# Patient Record
Sex: Female | Born: 1935 | Race: White | Hispanic: No | Smoking: Never smoker
Health system: Southern US, Community
[De-identification: ages and names within clinical notes are randomized; demographics above are authoritative.]

## PROBLEM LIST (undated history)

## (undated) DIAGNOSIS — F329 Major depressive disorder, single episode, unspecified: Secondary | ICD-10-CM

## (undated) DIAGNOSIS — F419 Anxiety disorder, unspecified: Secondary | ICD-10-CM

## (undated) DIAGNOSIS — I10 Essential (primary) hypertension: Secondary | ICD-10-CM

## (undated) DIAGNOSIS — Z87828 Personal history of other (healed) physical injury and trauma: Secondary | ICD-10-CM

## (undated) DIAGNOSIS — F32A Depression, unspecified: Secondary | ICD-10-CM

## (undated) HISTORY — DX: Depression, unspecified: F32.A

## (undated) HISTORY — DX: Essential (primary) hypertension: I10

## (undated) HISTORY — PX: NECK SURGERY: SHX720

## (undated) HISTORY — DX: Anxiety disorder, unspecified: F41.9

## (undated) HISTORY — PX: CHOLECYSTECTOMY: SHX55

## (undated) HISTORY — PX: ABDOMINAL HYSTERECTOMY: SHX81

---
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
## 2007-02-20 ENCOUNTER — Ambulatory Visit: Payer: Self-pay | Admitting: Family Medicine

## 2007-02-20 DIAGNOSIS — G589 Mononeuropathy, unspecified: Secondary | ICD-10-CM | POA: Insufficient documentation

## 2007-02-20 DIAGNOSIS — M545 Low back pain, unspecified: Secondary | ICD-10-CM | POA: Insufficient documentation

## 2007-02-20 DIAGNOSIS — R5383 Other fatigue: Secondary | ICD-10-CM

## 2007-02-20 DIAGNOSIS — R5381 Other malaise: Secondary | ICD-10-CM | POA: Insufficient documentation

## 2007-02-20 DIAGNOSIS — I10 Essential (primary) hypertension: Secondary | ICD-10-CM

## 2007-02-20 DIAGNOSIS — F341 Dysthymic disorder: Secondary | ICD-10-CM | POA: Insufficient documentation

## 2007-02-21 ENCOUNTER — Encounter (INDEPENDENT_AMBULATORY_CARE_PROVIDER_SITE_OTHER): Payer: Self-pay | Admitting: Family Medicine

## 2007-02-22 LAB — CONVERTED CEMR LAB
AST: 13 units/L (ref 0–37)
Albumin: 4.2 g/dL (ref 3.5–5.2)
Alkaline Phosphatase: 64 units/L (ref 39–117)
BUN: 14 mg/dL (ref 6–23)
Calcium: 9.3 mg/dL (ref 8.4–10.5)
Chloride: 105 meq/L (ref 96–112)
Eosinophils Absolute: 0 10*3/uL (ref 0.0–0.7)
Glucose, Bld: 80 mg/dL (ref 70–99)
Lymphs Abs: 1.9 10*3/uL (ref 0.7–4.0)
MCV: 98.4 fL (ref 78.0–100.0)
Monocytes Relative: 11 % (ref 3–12)
Neutro Abs: 4.5 10*3/uL (ref 1.7–7.7)
Neutrophils Relative %: 62 % (ref 43–77)
Platelets: 353 10*3/uL (ref 150–400)
Potassium: 4.1 meq/L (ref 3.5–5.3)
RBC: 4.46 M/uL (ref 3.87–5.11)
Sodium: 140 meq/L (ref 135–145)
Total Protein: 6.9 g/dL (ref 6.0–8.3)
WBC: 7.2 10*3/uL (ref 4.0–10.5)

## 2007-02-23 ENCOUNTER — Telehealth (INDEPENDENT_AMBULATORY_CARE_PROVIDER_SITE_OTHER): Payer: Self-pay | Admitting: *Deleted

## 2007-03-09 ENCOUNTER — Ambulatory Visit: Payer: Self-pay | Admitting: Family Medicine

## 2007-03-09 DIAGNOSIS — R519 Headache, unspecified: Secondary | ICD-10-CM | POA: Insufficient documentation

## 2007-03-09 DIAGNOSIS — R51 Headache: Secondary | ICD-10-CM | POA: Insufficient documentation

## 2007-04-12 ENCOUNTER — Telehealth (INDEPENDENT_AMBULATORY_CARE_PROVIDER_SITE_OTHER): Payer: Self-pay | Admitting: *Deleted

## 2014-02-01 DIAGNOSIS — Z87828 Personal history of other (healed) physical injury and trauma: Secondary | ICD-10-CM

## 2014-02-01 HISTORY — DX: Personal history of other (healed) physical injury and trauma: Z87.828

## 2015-05-28 LAB — PULMONARY FUNCTION TEST

## 2018-08-02 ENCOUNTER — Encounter: Payer: Self-pay | Admitting: Family Medicine

## 2018-08-02 ENCOUNTER — Ambulatory Visit (INDEPENDENT_AMBULATORY_CARE_PROVIDER_SITE_OTHER): Payer: Medicare Other | Admitting: Family Medicine

## 2018-08-02 ENCOUNTER — Encounter (INDEPENDENT_AMBULATORY_CARE_PROVIDER_SITE_OTHER): Payer: Self-pay

## 2018-08-02 ENCOUNTER — Other Ambulatory Visit: Payer: Self-pay

## 2018-08-02 VITALS — BP 142/68 | HR 78 | Temp 98.2°F | Resp 12 | Ht 60.0 in | Wt 167.1 lb

## 2018-08-02 DIAGNOSIS — R251 Tremor, unspecified: Secondary | ICD-10-CM | POA: Diagnosis not present

## 2018-08-02 DIAGNOSIS — G4700 Insomnia, unspecified: Secondary | ICD-10-CM

## 2018-08-02 DIAGNOSIS — M5440 Lumbago with sciatica, unspecified side: Secondary | ICD-10-CM

## 2018-08-02 DIAGNOSIS — I10 Essential (primary) hypertension: Secondary | ICD-10-CM | POA: Diagnosis not present

## 2018-08-02 DIAGNOSIS — Z6832 Body mass index (BMI) 32.0-32.9, adult: Secondary | ICD-10-CM

## 2018-08-02 DIAGNOSIS — E6609 Other obesity due to excess calories: Secondary | ICD-10-CM | POA: Diagnosis not present

## 2018-08-02 DIAGNOSIS — G8929 Other chronic pain: Secondary | ICD-10-CM

## 2018-08-02 DIAGNOSIS — L602 Onychogryphosis: Secondary | ICD-10-CM

## 2018-08-02 DIAGNOSIS — F419 Anxiety disorder, unspecified: Secondary | ICD-10-CM

## 2018-08-02 NOTE — Patient Instructions (Signed)
    Thank you for coming into the office today. I appreciate the opportunity to provide you with the care for your health and wellness. Today we discussed: overall health  FOLLOW UP: 3 months  Labs today  Work on walking daily and eating a well balanced diet.  We will get records and review.   Please continue to practice social distancing to keep you, your family, and our community safe.  If you must go out, please wear a Mask and practice good handwashing.  [place] YOUR HANDS WELL AND FREQUENTLY. AVOID TOUCHING YOUR FACE, UNLESS YOUR HANDS ARE FRESHLY WASHED.  GET FRESH AIR DAILY. STAY HYDRATED WITH WATER.   It was a pleasure to see you and I look forward to continuing to work together on your health and well-being. Please do not hesitate to call the office if you need care or have questions about your care.  Have a wonderful day and week.  With Gratitude,  Cherly Beach, DNP, AGNP-BC

## 2018-08-02 NOTE — Progress Notes (Signed)
Subjective:     Patient ID: Nancy Schroeder, female   DOB: Jan 14, 1934, 85 y.o.   MRN: [id number]  Nancy Schroeder presents for New Patient (Initial Visit) (establish care)  Nancy Schroeder moved from [place] state with her son back in February.   Already had 1 son that lives down here in the [place] area.   Has a history of hypertension, back injury, depression, anxiety, neck surgery, abdominal hysterectomy and cholecystectomy, family history is pertinent for diabetes.  Reports that she is never smoked.  Reports that she occasionally drinks alcohol.  Never had drug use.  Lives with her son Nancy Schroeder who she moved down here with.  Overall has 3 sons.  As previously stated the oldest one lives in [place].  The youngest one lives in [place] still [place].  She has cats at home.  She enjoys sewing and knitting.  Really likes astrology as well.  Diet wise to try to eat what she wants she does enjoy fruits and veggies.  Not very much in heavy meat eating.  She enjoys 1 to 2 cups of coffee daily.  And loves to drink iced tea.  Will drink 6 to 8 cups of water regularly.    Reports that she is taking BuSpar as needed. Denies having any excessive anxiety.  Reports that she is taking her hydrochlorothiazide and lisinopril as directed.  Does not have any signs and symptoms of elevated BP at home.  Per her.  Denies having any headaches, vision changes, dizziness, palpitations.    Concerns today: Back injury: Hurt at [place] working 2016. Lower back and neck injured, needs sx, but spinal cord is at risk. Wants to wait on secondary opinion  Legs: Was told she might have a day she can never walk.   Shaking: occurs randomly, family hx of DM, never told you DM, unsure of labs. When shaking she eats something and it makes it better. Denies excess thrist, hunger, and urination . Headaches.   Will need to get her previous lab work along with her history from the offices that she went to in [place].   She is unsure on most of her background and more specific details of her history. Overall though outside of the concerns that she brought up that were stated above.  She is doing well.  Denies having any signs of symptoms of fever or chills, cough or shortness of breath.  She would like to have her toenails cut so referral to podiatry is requested.  Today patient denies signs and symptoms of COVID 19 infection including fever, chills, cough, shortness of breath, and headache.  Past Medical, Surgical, Social History, Allergies, and Medications have been Reviewed.    Past Medical History:  Diagnosis Date   Anxiety    Depression    H/O back injury 2016   Hypertension    Past Surgical History:  Procedure Laterality Date   ABDOMINAL HYSTERECTOMY     CHOLECYSTECTOMY     NECK SURGERY     Social History   Socioeconomic History   Marital status: Widowed    Spouse name: Not on file   Number of children: 3   Years of education: Not on file   Highest education level: 8th grade  Occupational History   Not on file  Social Needs   Financial resource strain: Not hard at all   Food insecurity    Worry: Never true    Inability: Never true   Transportation needs  Medical: No    Non-medical: No  Tobacco Use   Smoking status: Never Smoker   Smokeless tobacco: Never Used  Substance and Sexual Activity   Alcohol use: Yes   Drug use: Never   Sexual activity: Not Currently  Lifestyle   Physical activity    Days per week: 0 days    Minutes per session: 0 min   Stress: Only a little  Relationships   Social connections    Talks on phone: More than three times a week    Gets together: More than three times a week    Attends religious service: Never    Active member of club or organization: No    Attends meetings of clubs or organizations: Never    Relationship status: Widowed   Intimate partner violence    Fear of current or ex partner: No    Emotionally  abused: No    Physically abused: No    Forced sexual activity: No  Other Topics Concern   Not on file  Social History Narrative   Live with Insurance underwriter (middle)   Has 3 sons   Oldest lives in [place] lives in [place] (with 1 grandchild)   3 cats: lily-16 , troubles, whiskers (boys-5 years)      Enjoyed sewing, and knitting, but back injury made this worse. Read, tv, putt around, likes astrology      Diet: Eats what she wants. Enjoys veggies and fruits   Caffeine: 1-2 cups of coffee, iced tea   Water: 6-8 cups daily       Wear seatbelt   Does not wear sunscreen   Smoke and carbon monoxide detectors          Outpatient Encounter Medications as of 08/02/2018  Medication Sig   busPIRone (BUSPAR) 10 MG tablet Take 10 mg by mouth daily as needed.   hydrochlorothiazide (HYDRODIURIL) 25 MG tablet Take 25 mg by mouth daily.   lisinopril (ZESTRIL) 40 MG tablet Take 40 mg by mouth daily.   No facility-administered encounter medications on file as of 08/02/2018.    Allergies  Allergen Reactions   Influenza Vaccines Swelling    Review of Systems  Constitutional: Negative for activity change, appetite change, chills and fever.  HENT: Negative.   Eyes: Negative for visual disturbance.  Respiratory: Negative for cough and shortness of breath.   Cardiovascular: Negative for chest pain, palpitations and leg swelling.  Gastrointestinal: Negative.   Endocrine: Negative for polydipsia, polyphagia and polyuria.  Genitourinary: Negative.   Musculoskeletal: Positive for arthralgias and back pain.  Skin: Negative.   Neurological: Positive for headaches. Negative for dizziness.  Hematological: Negative.   Psychiatric/Behavioral: Positive for sleep disturbance. The patient is nervous/anxious.        Objective:     BP (!) 142/68    Pulse 78    Temp 98.2 F (36.8 C) (Temporal)    Resp 12    Ht 5' (1.524 m)    Wt 167 lb 1.3 oz (75.8 kg)    SpO2 97%    BMI 32.63 kg/m    Physical Exam Constitutional:      Appearance: Normal appearance. She is obese.  HENT:     Head: Normocephalic and atraumatic.     Right Ear: External ear normal.     Left Ear: External ear normal.     Nose: Nose normal.  Eyes:     General:        Right eye: No discharge.  Left eye: No discharge.     Conjunctiva/sclera: Conjunctivae normal.  Neck:     Musculoskeletal: Normal range of motion and neck supple.  Cardiovascular:     Rate and Rhythm: Normal rate and regular rhythm.     Pulses: Normal pulses.     Heart sounds: Normal heart sounds.  Pulmonary:     Effort: Pulmonary effort is normal.     Breath sounds: Normal breath sounds.  Musculoskeletal: Normal range of motion.  Skin:    General: Skin is warm and dry.  Neurological:     Mental Status: She is alert and oriented to person, place, and time.  Psychiatric:        Mood and Affect: Mood normal.        Behavior: Behavior normal.        Thought Content: Thought content normal.        Judgment: Judgment normal.        Assessment and Plan        1. Chronic low back pain with sciatica, sciatica laterality unspecified, unspecified back pain laterality She reports chronic low back pain with sciatica.  Secondary to an injury as well.  Will need to get her follow-up records to assess the injury as she is not 100% sure on what it was.  In addition to possible need for referral as well.  2. Essential hypertension Blood pressure is a little bit elevated she reports she is not taking any of her medicines yet today.  But reports that she normally takes them on a daily basis.  Advised for her to continue to take the medications prior to coming into the appointment so I can make sure that they are doing the best for her.  Or if she needs to have any adjustments or changes.  We will be assessing her blood levels and kidney function and liver function.  Advised to maintain a DASH diet.  Encouraged her to walk 30 minutes on  most days of the week as she can.  Secondary to her back pain.  - COMPLETE METABOLIC PANEL WITH GFR - CBC with Differential/Platelet  3. Episode of shaking Reports she has episodes of shaking that go away when she eats.  Has a family history of diabetes.  She is unsure if this is the cause.  We will be assessing A1c and vitamin B12 to address if either 1 of these could be the cause.  - Hemoglobin A1c - Vitamin B12  4. Class 1 obesity due to excess calories with body mass index (BMI) of 32.0 to 32.9 in adult, unspecified whether serious comorbidity present Educated her on the need to lose some weight.  We will be assessing her labs as well.  Advised to keep a heart healthy, low carbohydrate low-fat diet.  Additionally encouraged her to exercise 30 minutes on at least 5 or more days of the week.  - COMPLETE METABOLIC PANEL WITH GFR - Hemoglobin A1c - Lipid panel - TSH  5. Anxiety Has anxiety has occasional use of buspirone.  Reports that she is doing well and does not need a refill at this time and feels it is under control.  6. Insomnia, unspecified type okay thanks reports she does not sleep well.  This is something that she is suffered with in the past.  Not currently going to prescribe anything for this at this time.  Advised that if she can get some good some way and walking and that might help her with her  rest.  And making sure that she does not layer take naps during the day.  But to be mindful that it is not uncommon to have fragmented sleep as we get older.  Follow Up: 3 months   Perlie Mayo, DNP, AGNP-BC [address], [address], [place] [address] Office Hours: Mon-Thurs 8 am-5 pm; Fri 8 am-12 pm Office Phone:  [phone]  Office Fax: [phone]

## 2018-08-03 LAB — CBC WITH DIFFERENTIAL/PLATELET
Absolute Monocytes: 636 cells/uL (ref 200–950)
Basophils Absolute: 77 cells/uL (ref 0–200)
Basophils Relative: 0.9 %
Eosinophils Absolute: 103 cells/uL (ref 15–500)
Eosinophils Relative: 1.2 %
HCT: 40.9 % (ref 35.0–45.0)
Hemoglobin: 13.6 g/dL (ref 11.7–15.5)
Lymphs Abs: 2081 cells/uL (ref 850–3900)
MCH: 30.7 pg (ref 27.0–33.0)
MCHC: 33.3 g/dL (ref 32.0–36.0)
MCV: 92.3 fL (ref 80.0–100.0)
MPV: 9.9 fL (ref 7.5–12.5)
Monocytes Relative: 7.4 %
Neutro Abs: 5702 cells/uL (ref 1500–7800)
Neutrophils Relative %: 66.3 %
Platelets: 323 10*3/uL (ref 140–400)
RBC: 4.43 10*6/uL (ref 3.80–5.10)
RDW: 11.8 % (ref 11.0–15.0)
Total Lymphocyte: 24.2 %
WBC: 8.6 10*3/uL (ref 3.8–10.8)

## 2018-08-03 LAB — LIPID PANEL
Cholesterol: 184 mg/dL (ref <200)
HDL: 51 mg/dL (ref >=50)
LDL Cholesterol (Calc): 107 mg/dL (calc) — ABNORMAL HIGH
Non-HDL Cholesterol (Calc): 133 mg/dL (calc) — ABNORMAL HIGH (ref <130)
Total CHOL/HDL Ratio: 3.6 (calc) (ref <5.0)
Triglycerides: 138 mg/dL (ref <150)

## 2018-08-03 LAB — COMPLETE METABOLIC PANEL WITH GFR
AG Ratio: 1.5 (calc) (ref 1.0–2.5)
ALT: 18 U/L (ref 6–29)
AST: 17 U/L (ref 10–35)
Albumin: 4 g/dL (ref 3.6–5.1)
Alkaline phosphatase (APISO): 60 U/L (ref 37–153)
BUN: 12 mg/dL (ref 7–25)
CO2: 27 mmol/L (ref 20–32)
Calcium: 9.6 mg/dL (ref 8.6–10.4)
Chloride: 106 mmol/L (ref 98–110)
Creat: 0.74 mg/dL (ref 0.60–0.88)
GFR, Est African American: 87 mL/min/{1.73_m2} (ref >=60)
GFR, Est Non African American: 75 mL/min/{1.73_m2} (ref >=60)
Globulin: 2.7 g/dL (calc) (ref 1.9–3.7)
Glucose, Bld: 91 mg/dL (ref 65–99)
Potassium: 4.1 mmol/L (ref 3.5–5.3)
Sodium: 142 mmol/L (ref 135–146)
Total Bilirubin: 0.6 mg/dL (ref 0.2–1.2)
Total Protein: 6.7 g/dL (ref 6.1–8.1)

## 2018-08-03 LAB — HEMOGLOBIN A1C
Hgb A1c MFr Bld: 5.5 % of total Hgb (ref <5.7)
Mean Plasma Glucose: 111 (calc)
eAG (mmol/L): 6.2 (calc)

## 2018-08-03 LAB — VITAMIN B12: Vitamin B-12: 339 pg/mL (ref 200–1100)

## 2018-08-03 LAB — TSH: TSH: 2.35 mIU/L (ref 0.40–4.50)

## 2018-08-03 NOTE — Progress Notes (Signed)
Overall labs are great. Kidney, liver, blood levels,  A1c (diabetes), thyroid and Vitamin B12 all in normal range. Only elevation was bad cholesterol. Slightly elevated, can reduce this by avoiding fried, fatty foods, and extra oils, butters, and sauces. Continue a well balanced diet, hydration with water, and exercise.

## 2018-09-01 ENCOUNTER — Other Ambulatory Visit: Payer: Self-pay

## 2018-09-01 ENCOUNTER — Encounter: Payer: Self-pay | Admitting: Podiatry

## 2018-09-01 ENCOUNTER — Ambulatory Visit (INDEPENDENT_AMBULATORY_CARE_PROVIDER_SITE_OTHER): Payer: Medicare Other | Admitting: Podiatry

## 2018-09-01 DIAGNOSIS — M79675 Pain in left toe(s): Secondary | ICD-10-CM

## 2018-09-01 DIAGNOSIS — M79674 Pain in right toe(s): Secondary | ICD-10-CM

## 2018-09-01 DIAGNOSIS — B351 Tinea unguium: Secondary | ICD-10-CM | POA: Insufficient documentation

## 2018-09-01 NOTE — Progress Notes (Signed)
Complaint:  Visit Type: Patient presents  to my office for  preventative foot care services. Complaint: Patient states" my nails have grown long and thick and become painful to walk and wear shoes" Patient has been diagnosed with neuropathy. The patient presents for preventative foot care services. No changes to ROS.  Patient says he has back pathology which affects her left side.  Podiatric Exam: Vascular: dorsalis pedis  are palpable bilateral.  Posterior tibial pulses are non palpable  B/L. Capillary return is immediate. Temperature gradient is WNL. Skin turgor WNL  Sensorium: Normal Semmes Weinstein monofilament test  Right foot.  Absent LOPS left foot.. Normal tactile sensation  Right foot.  Absent tactile sensation left f Nail Exam: Pt has thick disfigured discolored nails with subungual debris noted bilateral entire nail hallux through fifth toenails Ulcer Exam: There is no evidence of ulcer or pre-ulcerative changes or infection. Orthopedic Exam: Muscle tone and strength are 2/4  Inv/ev B/l.Marland Kitchen No limitations in general ROM. No crepitus or effusions noted. Foot type and digits show no abnormalities. Bony prominences are unremarkable. Skin: No Porokeratosis. No infection or ulcers  Diagnosis:  Onychomycosis, , Pain in right toe, pain in left toes  Treatment & Plan Procedures and Treatment: Consent by patient was obtained for treatment procedures.   Debridement of mycotic and hypertrophic toenails, 1 through 5 bilateral and clearing of subungual debris. No ulceration, no infection noted.  Return Visit-Office Procedure: Patient instructed to return to the office for a follow up visit 3 months for continued evaluation and treatment.    Gardiner Barefoot DPM

## 2018-11-01 ENCOUNTER — Encounter: Payer: Self-pay | Admitting: Family Medicine

## 2018-11-01 ENCOUNTER — Other Ambulatory Visit: Payer: Self-pay

## 2018-11-01 ENCOUNTER — Encounter (INDEPENDENT_AMBULATORY_CARE_PROVIDER_SITE_OTHER): Payer: Self-pay

## 2018-11-01 ENCOUNTER — Ambulatory Visit (INDEPENDENT_AMBULATORY_CARE_PROVIDER_SITE_OTHER): Payer: Medicare Other | Admitting: Family Medicine

## 2018-11-01 VITALS — BP 150/66 | HR 87 | Temp 98.0°F | Resp 14 | Ht 60.0 in | Wt 168.0 lb

## 2018-11-01 DIAGNOSIS — M25519 Pain in unspecified shoulder: Secondary | ICD-10-CM

## 2018-11-01 DIAGNOSIS — R4789 Other speech disturbances: Secondary | ICD-10-CM | POA: Diagnosis not present

## 2018-11-01 DIAGNOSIS — G8929 Other chronic pain: Secondary | ICD-10-CM

## 2018-11-01 DIAGNOSIS — M5440 Lumbago with sciatica, unspecified side: Secondary | ICD-10-CM | POA: Diagnosis not present

## 2018-11-01 DIAGNOSIS — M542 Cervicalgia: Secondary | ICD-10-CM | POA: Diagnosis not present

## 2018-11-01 DIAGNOSIS — R6889 Other general symptoms and signs: Secondary | ICD-10-CM | POA: Diagnosis not present

## 2018-11-01 MED ORDER — KETOROLAC TROMETHAMINE 60 MG/2ML IM SOLN: 30.0000 mg | Freq: Once | INTRAMUSCULAR | Status: DC

## 2018-11-01 MED ORDER — METHYLPREDNISOLONE ACETATE 80 MG/ML IJ SUSP
60.0000 mg | Freq: Once | INTRAMUSCULAR | Status: AC
  Administered 2018-11-01: 60 mg via INTRAMUSCULAR

## 2018-11-01 NOTE — Patient Instructions (Signed)
    I appreciate the opportunity to provide you with the care for your health and wellness. Happy [place] Birthday!  Today we discussed: back, neck and shoulder pain, and questionable inability to talk Follow up: 3 months   Referral to Neurology today for pain and work up of what might have happen when you could not talk.  Injection of steroid today to help with pain.  Please continue to practice social distancing to keep you, your family, and our community safe.  If you must go out, please wear a mask and practice good handwashing.  GET FRESH AIR DAILY. STAY HYDRATED WITH WATER.   It was a pleasure to see you and I look forward to continuing to work together on your health and well-being. Please do not hesitate to call the office if you need care or have questions about your care.  Have a wonderful day and week. With Gratitude, Cherly Beach, DNP, AGNP-BC

## 2018-11-01 NOTE — Progress Notes (Signed)
Subjective:     Patient ID: Nancy Schroeder, female   DOB: Jul 14, 1933, 85 y.o.   MRN: [id number]  Nancy Schroeder presents for Follow-up (3 mth)  Here for follow-up of hypertension. Is not able to work out or move much. Reports constant pain with neck and back. Reports trying to eat healthy.  Does not check BP at home. Cardiac symptoms: none. Patient denies: chest pain, chest pressure/discomfort, exertional chest pressure/discomfort, irregular heart beat, lower extremity edema, orthopnea, palpitations, syncope and tachypnea. Cardiovascular risk factors: advanced age (older than 16 for men, 35 for women), hypertension, obesity (BMI >= 30 kg/m2) and sedentary lifestyle.   Reports taking all medications as directed and denies side effects.  Reports having what she thinks was a mini stroke and reports her memory is getting bad. She reports not being able to talk a few weeks ago, but couldn't ask for help from son. She has no left over issues from this. No history of it happening before either. She is worried about taking to many medications, but is in pain. She refused gabapentin for pain.  Is unsure if she wants something daily for pain. Reports "I will just suffer with it" Additionally she reports that she is having a hard time moving around walking and doing anything daily at home.  Because of the pain  Today patient denies signs and symptoms of COVID 19 infection including fever, chills, cough, shortness of breath, and headache.  Past Medical, Surgical, Social History, Allergies, and Medications have been Reviewed.   Past Medical History:  Diagnosis Date  . Anxiety   . Depression   . H/O back injury 2016  . Hypertension    Past Surgical History:  Procedure Laterality Date  . ABDOMINAL HYSTERECTOMY    . CHOLECYSTECTOMY    . NECK SURGERY     Social History   Socioeconomic History  . Marital status: Widowed    Spouse name: Not on file  . Number of children: 3  . Years  of education: Not on file  . Highest education level: 8th grade  Occupational History  . Not on file  Social Needs  . Financial resource strain: Not hard at all  . Food insecurity    Worry: Never true    Inability: Never true  . Transportation needs    Medical: No    Non-medical: No  Tobacco Use  . Smoking status: Never Smoker  . Smokeless tobacco: Never Used  Substance and Sexual Activity  . Alcohol use: Yes  . Drug use: Never  . Sexual activity: Not Currently  Lifestyle  . Physical activity    Days per week: 0 days    Minutes per session: 0 min  . Stress: Only a little  Relationships  . Social connections    Talks on phone: More than three times a week    Gets together: More than three times a week    Attends religious service: Never    Active member of club or organization: No    Attends meetings of clubs or organizations: Never    Relationship status: Widowed  . Intimate partner violence    Fear of current or ex partner: No    Emotionally abused: No    Physically abused: No    Forced sexual activity: No  Other Topics Concern  . Not on file  Social History Narrative   Live with Braxton Feathers (middle)   Has 3 sons   Oldest lives in [place] lives  in [place] (with 1 grandchild)   3 cats: lily-16 , troubles, whiskers (boys-5 years)      Enjoyed sewing, and knitting, but back injury made this worse. Read, tv, putt around, likes astrology      Diet: Eats what she wants. Enjoys veggies and fruits   Caffeine: 1-2 cups of coffee, iced tea   Water: 6-8 cups daily       Wear seatbelt   Does not wear sunscreen   Smoke and carbon monoxide detectors          Outpatient Encounter Medications as of 11/01/2018  Medication Sig  . busPIRone (BUSPAR) 10 MG tablet Take 10 mg by mouth daily as needed.  . hydrochlorothiazide (HYDRODIURIL) 25 MG tablet Take 25 mg by mouth daily.  Marland Kitchen lisinopril (ZESTRIL) 40 MG tablet Take 40 mg by mouth daily.   No  facility-administered encounter medications on file as of 11/01/2018.    Allergies  Allergen Reactions  . Influenza Vaccines Swelling    Review of Systems  Constitutional: Negative for chills and fever.  HENT: Negative.   Eyes: Negative.   Respiratory: Negative.   Cardiovascular: Negative.   Gastrointestinal: Negative.   Endocrine: Negative.   Genitourinary: Negative.   Musculoskeletal: Positive for arthralgias, back pain, myalgias and neck pain.  Skin: Negative.   Allergic/Immunologic: Negative.   Neurological:       See hpi   Hematological: Negative.   Psychiatric/Behavioral: Negative.   All other systems reviewed and are negative.      Objective:     BP (!) 150/66   Pulse 87   Temp 98 F (36.7 C) (Oral)   Resp 14   Ht 5' (1.524 m)   Wt 168 lb 0.6 oz (76.2 kg)   SpO2 98%   BMI 32.82 kg/m   Physical Exam Vitals signs and nursing note reviewed.  Constitutional:      Appearance: Normal appearance. She is well-developed and well-groomed. She is obese.  HENT:     Head: Normocephalic and atraumatic.     Right Ear: External ear normal.     Left Ear: External ear normal.     Nose: Nose normal.     Mouth/Throat:     Mouth: Mucous membranes are moist.     Pharynx: Oropharynx is clear.  Eyes:     General:        Right eye: No discharge.        Left eye: No discharge.     Conjunctiva/sclera: Conjunctivae normal.     Comments: glasses  Neck:     Musculoskeletal: Normal range of motion and neck supple.  Cardiovascular:     Rate and Rhythm: Normal rate and regular rhythm.     Pulses: Normal pulses.     Heart sounds: Normal heart sounds.  Pulmonary:     Effort: Pulmonary effort is normal.     Breath sounds: Normal breath sounds.  Musculoskeletal:     Right shoulder: She exhibits decreased range of motion, tenderness and pain.     Cervical back: She exhibits tenderness and pain.     Lumbar back: She exhibits decreased range of motion and pain.  Skin:     General: Skin is warm.  Neurological:     General: No focal deficit present.     Mental Status: She is alert and oriented to person, place, and time.     Cranial Nerves: Cranial nerves are intact.     Sensory: Sensation is intact.  Motor: Weakness present.     Coordination: Coordination is intact. Finger-Nose-Finger Test normal.     Gait: Gait abnormal.  Psychiatric:        Attention and Perception: Attention normal.        Mood and Affect: Mood normal. Affect is flat.        Speech: Speech normal.        Behavior: Behavior is slowed. Behavior is cooperative.        Thought Content: Thought content normal.        Cognition and Memory: Memory is impaired.        Assessment and Plan       1. Chronic low back pain with sciatica, sciatica laterality unspecified, unspecified back pain laterality Ongoing chronic low back pain with sciatica.  Secondary to injury as well.  Got her records and they confirmed this.  Referral to neurology as she reports that she does not necessarily know if she wants to be on any medication permanently as her memory is already bad.  Additionally she seems very forgetful and frail with the communication of pain medicine.  But does she reports that she does want to be on something to help her feel better because she cannot move around as much.  Righted with Depo-Medrol today in the office to help ease pain   Reviewed side effects, risks and benefits of medication.   Patient acknowledged agreement and understanding of the plan.   Referral has been placed to neurology  I appreciate collaboration in patient's plan of care. Please let PCP know if assistance from [place] is needed.   - Ambulatory referral to Neurology - methylPREDNISolone acetate (DEPO-MEDROL) injection 60 mg  2. Inability to speak I am unsure about this inability to speak.  She did not report to any emergency room or urgent care nor did she call the office about it.  She reports that she did not  know that she needed to.  She reports that it she is unsure of how long it lasted.  She try to get a hold of her son.  She said that she was fighting it to keep from happening.  Denies having any leftover issues with it.  Is speaking fine today without issue.  Neuro exam is intact.  Outside of poor walking secondary to ongoing back pain.  She does have some forgetfulness when she is talking.  Will need to assess memory in more detail along with pain issues.   - Ambulatory referral to Neurology  3. Neck and shoulder pain See above  - Ambulatory referral to Neurology - methylPREDNISolone acetate (DEPO-MEDROL) injection 60 mg  4. Forgetfulness In December we will bring her back in for MMSE unless neuro does not before me.  Question as to whether or not she has memory issues she was completely alert and oriented to time of day and location.  Did not have any problem with any of her neuro exam outside of the fact that she has a difficult ability walking.  Possibly related to pain more so than neuro deficits.   Follow Up: 01/31/2019  Perlie Mayo, DNP, AGNP-BC [address], [address], [place] [address] Office Hours: Mon-Thurs 8 am-5 pm; Fri 8 am-12 pm Office Phone:  [phone]  Office Fax: [phone]

## 2018-11-03 ENCOUNTER — Ambulatory Visit: Payer: Medicare Other | Admitting: Family Medicine

## 2018-12-01 ENCOUNTER — Ambulatory Visit (INDEPENDENT_AMBULATORY_CARE_PROVIDER_SITE_OTHER): Payer: Medicare Other | Admitting: Podiatry

## 2018-12-01 ENCOUNTER — Other Ambulatory Visit: Payer: Self-pay

## 2018-12-01 DIAGNOSIS — B351 Tinea unguium: Secondary | ICD-10-CM

## 2018-12-01 DIAGNOSIS — I739 Peripheral vascular disease, unspecified: Secondary | ICD-10-CM | POA: Diagnosis not present

## 2018-12-01 DIAGNOSIS — M79674 Pain in right toe(s): Secondary | ICD-10-CM | POA: Diagnosis not present

## 2018-12-01 DIAGNOSIS — M79675 Pain in left toe(s): Secondary | ICD-10-CM

## 2018-12-04 ENCOUNTER — Telehealth: Payer: Self-pay | Admitting: *Deleted

## 2018-12-04 ENCOUNTER — Telehealth: Payer: Self-pay | Admitting: Podiatry

## 2018-12-04 DIAGNOSIS — M79661 Pain in right lower leg: Secondary | ICD-10-CM

## 2018-12-04 DIAGNOSIS — M79662 Pain in left lower leg: Secondary | ICD-10-CM

## 2018-12-04 NOTE — Telephone Encounter (Signed)
This message answered in 12/04/2018 Staff Message.

## 2018-12-04 NOTE — Telephone Encounter (Signed)
Pt was seen in office on Friday 10/30 for nail trim and was told she might have a blockage in her left leg and would have a referral sent for her to have testing done. Pt calling to follow up

## 2018-12-04 NOTE — Telephone Encounter (Signed)
I informed pt of Dr. Serita Grit orders and we had referred to [facility]. Pt asked that I give the location instructions to her son. I informed pt's son, [facility] was in [facility] across from the [place] building at [address], [phone].

## 2018-12-04 NOTE — Telephone Encounter (Signed)
-----   Message from Nancy Schroeder, DPM sent at 12/04/2018 10:09 AM EST ----- Regarding: ABIs PVR Hi Valery,  Would you be able to order her ABIs PVRs of this patient.  Patient has claudication-like symptoms especially to the left side.   Thanks Lennette Bihari

## 2018-12-04 NOTE — Telephone Encounter (Signed)
Orders faxed to [facility]. 

## 2018-12-05 ENCOUNTER — Encounter: Payer: Self-pay | Admitting: Podiatry

## 2018-12-05 NOTE — Progress Notes (Signed)
  Subjective:  Patient ID: Nancy Schroeder, female    DOB: [birth year],  MRN: [id number]  Chief Complaint  Patient presents with  . Follow-up    nail trim , wants to figure out a way that she can be seen sooner rather than every 3 months- due to how long her nails grow/    85 y.o. female returns for the above complaint.  Patient states that she is here for bilateral painful mycotic thickened toenails.  She is unable to trim the toenails down herself.  She also has a secondary complaint of claudication type of pain to the left side.  She states that she is unable to walk more than half a block without resting.  She states that at night she gets intermittent claudication pain.  She also states that she gets charley horses to the back of the calf.  She denies any other acute complaints.  Objective:  There were no vitals filed for this visit. Podiatric Exam: Vascular: dorsalis pedis and posterior tibial pulses are diminished bilateral. Capillary return is delayed temperature gradient is WNL. Skin turgor WNL  Sensorium: Normal Semmes Weinstein monofilament test. Normal tactile sensation bilaterally. Nail Exam: Pt has thick disfigured discolored nails with subungual debris noted bilateral entire nail hallux through fifth toenails Ulcer Exam: There is no evidence of ulcer or pre-ulcerative changes or infection. Orthopedic Exam: Muscle tone and strength are WNL. No limitations in general ROM. No crepitus or effusions noted. HAV  B/L.  Hammer toes 2-5  B/L. Skin: No Porokeratosis. No infection or ulcers  Assessment & Plan:  Patient was evaluated and treated and all questions answered.  Peripheral vascular disease -Given that she has diminished pedal pulses in the setting of intermittent claudication pain, I recommended that patient obtain ABIs PVRs right away to assess the vascular flow to the lower extremity especially on the left side. -If the vascular flow is diminished patient will benefit from  a vascular surgery follow-up -I explained the etiology and various treatment options available were peripheral vascular disease.  Patient left my office fully informed about the disease and ways to increase blood flow to the lower extremity.  Onychomycosis with pain  -Nails palliatively debrided as below. -Educated on self-care  Procedure: Nail Debridement Rationale: pain  Type of Debridement: manual, sharp debridement. Instrumentation: Nail nipper, rotary burr. Number of Nails: 10  Procedures and Treatment: Consent by patient was obtained for treatment procedures. The patient understood the discussion of treatment and procedures well. All questions were answered thoroughly reviewed. Debridement of mycotic and hypertrophic toenails, 1 through 5 bilateral and clearing of subungual debris. No ulceration, no infection noted.  Return Visit-Office Procedure: Patient instructed to return to the office for a follow up visit 3 months for continued evaluation and treatment.  Boneta Lucks, DPM    No follow-ups on file.

## 2018-12-07 ENCOUNTER — Other Ambulatory Visit: Payer: Self-pay

## 2018-12-07 ENCOUNTER — Ambulatory Visit (HOSPITAL_COMMUNITY)
Admission: RE | Admit: 2018-12-07 | Discharge: 2018-12-07 | Disposition: A | Discharge status: Home / Self Care | Payer: Medicare Other | Source: Ambulatory Visit | Attending: Cardiology | Admitting: Cardiology

## 2018-12-07 DIAGNOSIS — M79661 Pain in right lower leg: Secondary | ICD-10-CM

## 2018-12-07 DIAGNOSIS — M79662 Pain in left lower leg: Secondary | ICD-10-CM | POA: Insufficient documentation

## 2018-12-08 ENCOUNTER — Ambulatory Visit: Payer: Medicare Other | Admitting: Podiatry

## 2018-12-22 ENCOUNTER — Telehealth: Payer: Self-pay

## 2018-12-22 NOTE — Telephone Encounter (Signed)
New message    Patient calling, states she was told on 11/18 by staff to provide the name of where she had her last MRI.  [facility] Phone [phone] Date of service 08/02/17

## 2018-12-25 NOTE — Telephone Encounter (Signed)
Left message to call back  

## 2018-12-25 NOTE — Telephone Encounter (Signed)
I have not seen this patient and don't know what this message means.

## 2018-12-25 NOTE — Telephone Encounter (Signed)
Did not see where patient saw any providers @ any  [place] offices. Only saw that Dr. Percival Spanish had ordered a test and maybe he requested the information at that time. Will forward information to him.

## 2019-01-24 ENCOUNTER — Ambulatory Visit: Payer: Medicare Other | Admitting: Family Medicine

## 2019-01-30 ENCOUNTER — Ambulatory Visit: Payer: Medicare Other | Admitting: Family Medicine

## 2019-01-31 ENCOUNTER — Ambulatory Visit: Payer: Medicare Other | Admitting: Family Medicine

## 2019-02-06 ENCOUNTER — Other Ambulatory Visit: Payer: Self-pay

## 2019-02-06 ENCOUNTER — Encounter (INDEPENDENT_AMBULATORY_CARE_PROVIDER_SITE_OTHER): Payer: Self-pay

## 2019-02-06 ENCOUNTER — Ambulatory Visit (INDEPENDENT_AMBULATORY_CARE_PROVIDER_SITE_OTHER): Payer: Medicare Other | Admitting: Family Medicine

## 2019-02-06 ENCOUNTER — Encounter: Payer: Self-pay | Admitting: Family Medicine

## 2019-02-06 VITALS — BP 138/70 | HR 88 | Temp 98.1°F | Resp 15 | Ht 60.0 in | Wt 172.1 lb

## 2019-02-06 DIAGNOSIS — M546 Pain in thoracic spine: Secondary | ICD-10-CM | POA: Diagnosis not present

## 2019-02-06 DIAGNOSIS — E669 Obesity, unspecified: Secondary | ICD-10-CM

## 2019-02-06 DIAGNOSIS — R6889 Other general symptoms and signs: Secondary | ICD-10-CM | POA: Diagnosis not present

## 2019-02-06 DIAGNOSIS — I10 Essential (primary) hypertension: Secondary | ICD-10-CM | POA: Diagnosis not present

## 2019-02-06 MED ORDER — KETOROLAC TROMETHAMINE 60 MG/2ML IM SOLN
30.0000 mg | Freq: Once | INTRAMUSCULAR | Status: AC
  Administered 2019-02-06: 30 mg via INTRAMUSCULAR

## 2019-02-06 MED ORDER — METHYLPREDNISOLONE ACETATE 80 MG/ML IJ SUSP
80.0000 mg | Freq: Once | INTRAMUSCULAR | Status: AC
  Administered 2019-02-06: 14:00:00 80 mg via INTRAMUSCULAR

## 2019-02-06 NOTE — Assessment & Plan Note (Signed)
Deteriorated  Nancy Schroeder is re-educated about the importance of exercise daily to help with weight management. A minumum of 30 minutes daily is recommended. Additionally, importance of healthy food choices  with portion control discussed.  Wt Readings from Last 3 Encounters:  02/06/19 172 lb 1.3 oz (78.1 kg)  11/01/18 168 lb 0.6 oz (76.2 kg)  08/02/18 167 lb 1.3 oz (75.8 kg)

## 2019-02-06 NOTE — Patient Instructions (Addendum)
I appreciate the opportunity to provide you with care for your health and wellness. Today we discussed: blood pressure and memory   Follow up: 3 months   No labs or referrals today  Injections today to help with back pain.  Make sure you remember your posturing when you are working in the kitchen or sitting or standing.  Continue all medications as directed.  Call when you need refills.  Continue to eat a well-balanced diet and increase water intake.  Please continue to practice social distancing to keep you, your family, and our community safe.  If you must go out, please wear a mask and practice good handwashing.  It was a pleasure to see you and I look forward to continuing to work together on your health and well-being. Please do not hesitate to call the office if you need care or have questions about your care.  Have a wonderful day and week. With Gratitude, Cherly Beach, DNP, AGNP-BC

## 2019-02-06 NOTE — Progress Notes (Signed)
Subjective:  Patient ID: Nancy Schroeder, female    DOB: 1933/03/16  Age: 86 y.o. MRN: [id number]  CC:  Chief Complaint  Patient presents with  . Hypertension    follow up with MMSE      HPI  Hypertension This is a chronic problem. The current episode started more than 1 year ago. The problem has been gradually improving since onset. The problem is controlled. Pertinent negatives include no chest pain or headaches. There are no associated agents to hypertension. There are no known risk factors for coronary artery disease. Past treatments include ACE inhibitors and diuretics. The current treatment provides moderate improvement. Compliance problems include exercise and diet.   Back Pain This is a new problem. The current episode started in the past 7 days. The problem occurs intermittently. The problem has been waxing and waning since onset. The pain is present in the thoracic spine. The quality of the pain is described as aching. The pain does not radiate. The pain is at a severity of 6/10. The pain is moderate. The pain is worse during the day. The symptoms are aggravated by bending, sitting and twisting. Pertinent negatives include no abdominal pain, bladder incontinence, bowel incontinence, chest pain, dysuria, headaches, leg pain, numbness, paresis, pelvic pain, perianal numbness, tingling or weakness. Risk factors include obesity, sedentary lifestyle, poor posture and lack of exercise. She has tried nothing for the symptoms. The treatment provided no relief.   today patient denies signs and symptoms of COVID 19 infection including fever, chills, cough, shortness of breath, and headache. Past Medical, Surgical, Social History, Allergies, and Medications have been Reviewed.   Past Medical History:  Diagnosis Date  . Anxiety   . Depression   . H/O back injury 2016  . Hypertension     Current Meds  Medication Sig  . busPIRone (BUSPAR) 10 MG tablet Take 10 mg by mouth daily  as needed.  . hydrochlorothiazide (HYDRODIURIL) 25 MG tablet Take 25 mg by mouth daily.  Marland Kitchen lisinopril (ZESTRIL) 40 MG tablet Take 40 mg by mouth daily.    ROS:  Review of Systems  Constitutional: Negative.   HENT: Negative.   Eyes: Negative.   Respiratory: Negative.   Cardiovascular: Negative.  Negative for chest pain.  Gastrointestinal: Negative.  Negative for abdominal pain and bowel incontinence.  Genitourinary: Negative.  Negative for bladder incontinence, dysuria and pelvic pain.  Musculoskeletal: Positive for back pain.  Skin: Negative.   Neurological: Negative.  Negative for tingling, weakness, numbness and headaches.  Endo/Heme/Allergies: Negative.   Psychiatric/Behavioral: Negative.   All other systems reviewed and are negative.    Objective:   Today's Vitals: BP 138/70   Pulse 88   Temp 98.1 F (36.7 C) (Oral)   Resp 15   Ht 5' (1.524 m)   Wt 172 lb 1.3 oz (78.1 kg)   SpO2 97%   BMI 33.61 kg/m  Vitals with BMI 02/06/2019 11/01/2018 09/01/2018  Height 5\' 0"  5\' 0"  -  Weight 172 lbs 1 oz 168 lbs 1 oz -  BMI [id number] 0000000 -  Systolic 0000000 [id number]  Diastolic 70 66 87  Pulse 88 87 70     Physical Exam Vitals and nursing note reviewed.  Constitutional:      Appearance: Normal appearance. She is well-developed and well-groomed. She is obese.  HENT:     Head: Normocephalic and atraumatic.     Right Ear: External ear normal.     Left Ear: External ear normal.  Nose: Nose normal.     Mouth/Throat:     Mouth: Mucous membranes are moist.     Pharynx: Oropharynx is clear.  Eyes:     General:        Right eye: No discharge.        Left eye: No discharge.     Conjunctiva/sclera: Conjunctivae normal.  Cardiovascular:     Rate and Rhythm: Normal rate and regular rhythm.     Pulses: Normal pulses.     Heart sounds: Normal heart sounds.  Pulmonary:     Effort: Pulmonary effort is normal.     Breath sounds: Normal breath sounds.  Musculoskeletal:        General:  Normal range of motion.     Cervical back: Normal range of motion and neck supple.     Thoracic back: Spasms and tenderness present. No swelling, edema, deformity, signs of trauma or lacerations. Normal range of motion. No scoliosis.     Comments: Poor posture  ROM intact  Skin:    General: Skin is warm.  Neurological:     General: No focal deficit present.     Mental Status: She is alert and oriented to person, place, and time.  Psychiatric:        Attention and Perception: Attention normal.        Mood and Affect: Mood normal.        Speech: Speech normal.        Behavior: Behavior normal. Behavior is cooperative.        Thought Content: Thought content normal.        Cognition and Memory: Cognition normal.        Judgment: Judgment normal.    MMSE - Mini Mental State Exam 02/06/2019  Orientation to time 5  Orientation to Place 5  Registration 3  Attention/ Calculation 5  Recall 3  Language- name 2 objects 2  Language- repeat 1  Language- follow 3 step command 3  Language- read & follow direction 1  Write a sentence 1  Copy design 1  Total score 30   Assessment   1. Essential hypertension   2. Obesity (BMI 30.0-34.9)   3. Forgetfulness   4. Acute midline thoracic back pain     Tests ordered No orders of the defined types were placed in this encounter.    Plan: Please see assessment and plan per problem list above.   Meds ordered this encounter  Medications  . ketorolac (TORADOL) injection 30 mg  . methylPREDNISolone acetate (DEPO-MEDROL) injection 80 mg    Patient to follow-up in 3 months.  Perlie Mayo, NP

## 2019-02-06 NOTE — Assessment & Plan Note (Signed)
Nancy Schroeder is encouraged to maintain a well balanced diet that is low in salt. Controlled, continue current medication regimen.  No refills needed.  Additionally, she is also reminded that exercise is beneficial for heart health and control of  Blood pressure. 30-60 minutes daily is recommended-walking was suggested.

## 2019-02-06 NOTE — Assessment & Plan Note (Signed)
MMSE was 30 today. Reports no major change in forgetfulness.

## 2019-02-06 NOTE — Assessment & Plan Note (Signed)
Recent onset of acute mid thoracic back pain. Reports doing a lot of cooking recently and standing and thinks maybe she was hunching over. Has hx of chronic low back pain, but reports this is not the same discomfort. Denies changes in bowel or bladder. Injections for Toradol and Depo-Medrol given today to help eases up.  Provided with stretches and good posturing to help with thoracic discomfort.   Reviewed side effects, risks and benefits of medication.   Patient acknowledged agreement and understanding of the plan.

## 2019-02-07 ENCOUNTER — Ambulatory Visit: Payer: Medicare Other | Admitting: Family Medicine

## 2019-03-07 ENCOUNTER — Encounter: Payer: Self-pay | Admitting: Podiatry

## 2019-03-07 ENCOUNTER — Ambulatory Visit (INDEPENDENT_AMBULATORY_CARE_PROVIDER_SITE_OTHER): Payer: Medicare Other | Admitting: Podiatry

## 2019-03-07 ENCOUNTER — Other Ambulatory Visit: Payer: Self-pay

## 2019-03-07 DIAGNOSIS — M79674 Pain in right toe(s): Secondary | ICD-10-CM | POA: Diagnosis not present

## 2019-03-07 DIAGNOSIS — M79675 Pain in left toe(s): Secondary | ICD-10-CM

## 2019-03-07 DIAGNOSIS — I739 Peripheral vascular disease, unspecified: Secondary | ICD-10-CM | POA: Diagnosis not present

## 2019-03-07 DIAGNOSIS — M79661 Pain in right lower leg: Secondary | ICD-10-CM

## 2019-03-07 DIAGNOSIS — I998 Other disorder of circulatory system: Secondary | ICD-10-CM

## 2019-03-07 DIAGNOSIS — B351 Tinea unguium: Secondary | ICD-10-CM

## 2019-03-07 DIAGNOSIS — M79662 Pain in left lower leg: Secondary | ICD-10-CM

## 2019-03-07 NOTE — Progress Notes (Signed)
  Subjective:  Patient ID: Nancy Schroeder, female    DOB: 1933-06-15,  MRN: [id number]  No chief complaint on file.  86 y.o. female returns for the above complaint.  Patient states that she is here for bilateral painful mycotic thickened toenails.  She is unable to trim the toenails down herself.  She also has a secondary complaint of claudication type of pain to the left side.  She states that she is unable to walk more than half a block without resting.  She states that at night she gets intermittent claudication pain.  She also states that she gets charley horses to the back of the calf.  She denies any other acute complaints.  Objective:  There were no vitals filed for this visit. Podiatric Exam: Vascular: dorsalis pedis and posterior tibial pulses are diminished bilateral. Capillary return is delayed temperature gradient is WNL. Skin turgor WNL  Sensorium: Normal Semmes Weinstein monofilament test. Normal tactile sensation bilaterally. Nail Exam: Pt has thick disfigured discolored nails with subungual debris noted bilateral entire nail hallux through fifth toenails Ulcer Exam: There is no evidence of ulcer or pre-ulcerative changes or infection. Orthopedic Exam: Muscle tone and strength are WNL. No limitations in general ROM. No crepitus or effusions noted. HAV  B/L.  Hammer toes 2-5  B/L. Skin: No Porokeratosis. No infection or ulcers  Assessment & Plan:  Patient was evaluated and treated and all questions answered.  Peripheral vascular disease -Patient is ABIs PVRs were discussed in details.  Patient has good flow to the lower extremity.  We will hold off on any vascular follow-up for vascular intervention for now.  If she develops any soft tissue loss we will consider it at that time.  Onychomycosis with pain  -Nails palliatively debrided as below. -Educated on self-care  Procedure: Nail Debridement Rationale: pain  Type of Debridement: manual, sharp  debridement. Instrumentation: Nail nipper, rotary burr. Number of Nails: 10  Procedures and Treatment: Consent by patient was obtained for treatment procedures. The patient understood the discussion of treatment and procedures well. All questions were answered thoroughly reviewed. Debridement of mycotic and hypertrophic toenails, 1 through 5 bilateral and clearing of subungual debris. No ulceration, no infection noted.  Return Visit-Office Procedure: Patient instructed to return to the office for a follow up visit 3 months for continued evaluation and treatment.  Boneta Lucks, DPM    No follow-ups on file.

## 2019-04-06 ENCOUNTER — Telehealth: Payer: Self-pay

## 2019-04-06 NOTE — Telephone Encounter (Signed)
Patient states she has a itchy rash on her leg that is getting bigger. I advised her to go to UC or the ED. She stated she doesn't have a way. I advised her that when she does have a way. She asked for an appointment. I advised her there were no available appts next week. She still refused to be seen by the uc or ED. Recommended hydrocortisone cream or benadryl cream or otc Benadryl tablets.

## 2019-04-06 NOTE — Telephone Encounter (Signed)
Pt wants Clinical to call her back regarding a itchy rash on her leg

## 2019-04-07 ENCOUNTER — Ambulatory Visit
Admission: EM | Admit: 2019-04-07 | Discharge: 2019-04-07 | Disposition: A | Discharge status: Home / Self Care | Payer: Medicare Other | Attending: Emergency Medicine | Admitting: Emergency Medicine

## 2019-04-07 ENCOUNTER — Other Ambulatory Visit: Payer: Self-pay

## 2019-04-07 DIAGNOSIS — R21 Rash and other nonspecific skin eruption: Secondary | ICD-10-CM

## 2019-04-07 MED ORDER — CETIRIZINE HCL 10 MG PO TABS: 10.0000 mg | ORAL_TABLET | Freq: Every day | ORAL | 0 refills | Status: DC

## 2019-04-07 MED ORDER — MUPIROCIN CALCIUM 2 % EX CREA: 1.0000 "application " | TOPICAL_CREAM | Freq: Two times a day (BID) | CUTANEOUS | 0 refills | Status: DC

## 2019-04-07 MED ORDER — CALAMINE EX LOTN: 1.0000 "application " | TOPICAL_LOTION | CUTANEOUS | 0 refills | Status: DC | PRN

## 2019-04-07 NOTE — Discharge Instructions (Signed)
Zyrtec prescribed for itching Calamine lotion prescribed for itching as well Bactroban prescribed to prevent secondary infection Moisturize skin daily  Follow up with PCP in 1-2 weeks for recheck and to ensure symptoms are improving Return or go to the ER if you have any new or worsening symptoms such as fever, chills, nausea, vomiting, redness, swelling, discharge, if symptoms do not improve with medications, etc..Marland Kitchen

## 2019-04-07 NOTE — ED Provider Notes (Signed)
[facility]   [id number] 04/07/19 Arrival Time: 0831  CC: Rash  SUBJECTIVE:  Nancy Schroeder is a 86 y.o. female who presents with a rash to LLE x couple of days.  Denies precipitating event or trauma.  Denies changes in soaps, detergents, close contacts with similar rash, or allergy. Denies medications change or starting a new medication recently.  Localizes the rash to LLE.  Describes it as itchy.  Has tried antifungal without relief.  Symptoms are made worse with scratching.  Denies similar symptoms in the past.   States leg felt cold the other day, but feels warm to the touch today.  Denies fever, chills, nausea, vomiting, erythema, swelling, discharge, oral lesions, SOB, chest pain, abdominal pain, changes in bowel or bladder function, paresthesia, pallor, LLE pain.    ROS: As per HPI.  All other pertinent ROS negative.     Past Medical History:  Diagnosis Date  . Anxiety   . Depression   . H/O back injury 2016  . Hypertension    Past Surgical History:  Procedure Laterality Date  . ABDOMINAL HYSTERECTOMY    . CHOLECYSTECTOMY    . NECK SURGERY     Allergies  Allergen Reactions  . Influenza Vaccines Swelling   No current facility-administered medications on file prior to encounter.   Current Outpatient Medications on File Prior to Encounter  Medication Sig Dispense Refill  . busPIRone (BUSPAR) 10 MG tablet Take 10 mg by mouth daily as needed.    . hydrochlorothiazide (HYDRODIURIL) 25 MG tablet Take 25 mg by mouth daily.    Marland Kitchen lisinopril (ZESTRIL) 40 MG tablet Take 40 mg by mouth daily.     Social History   Socioeconomic History  . Marital status: Widowed    Spouse name: Not on file  . Number of children: 3  . Years of education: Not on file  . Highest education level: 8th grade  Occupational History  . Not on file  Tobacco Use  . Smoking status: Never Smoker  . Smokeless tobacco: Never Used  Substance and Sexual Activity  . Alcohol use: Yes  . Drug  use: Never  . Sexual activity: Not Currently  Other Topics Concern  . Not on file  Social History Narrative   Live with [place] (middle)   Has 3 sons   Oldest lives in [place] lives in [place] (with 1 grandchild)   3 cats: lily-16 , troubles, whiskers (boys-5 years)      Enjoyed sewing, and knitting, but back injury made this worse. Read, tv, putt around, likes astrology      Diet: Eats what she wants. Enjoys veggies and fruits   Caffeine: 1-2 cups of coffee, iced tea   Water: 6-8 cups daily       Wear seatbelt   Does not wear sunscreen   Smoke and carbon monoxide detectors         Social Determinants of Health   Financial Resource Strain: Low Risk   . Difficulty of Paying Living Expenses: Not hard at all  Food Insecurity: No Food Insecurity  . Worried About Charity fundraiser in the Last Year: Never true  . Ran Out of Food in the Last Year: Never true  Transportation Needs: No Transportation Needs  . Lack of Transportation (Medical): No  . Lack of Transportation (Non-Medical): No  Physical Activity: Inactive  . Days of Exercise per Week: 0 days  . Minutes of Exercise per Session: 0 min  Stress:  No Stress Concern Present  . Feeling of Stress : Only a little  Social Connections: Moderately Isolated  . Frequency of Communication with Friends and Family: More than three times a week  . Frequency of Social Gatherings with Friends and Family: More than three times a week  . Attends Religious Services: Never  . Active Member of Clubs or Organizations: No  . Attends Archivist Meetings: Never  . Marital Status: Widowed  Intimate Partner Violence: Not At Risk  . Fear of Current or Ex-Partner: No  . Emotionally Abused: No  . Physically Abused: No  . Sexually Abused: No   Family History  Problem Relation Age of Onset  . Diabetes Mother   . Diabetes Father   . Diabetes Sister     OBJECTIVE: Vitals:   04/07/19 0840  BP: (!) 155/84  Pulse:  85  Resp: 18  Temp: 98.3 F (36.8 C)  SpO2: 97%    General appearance: alert; no distress Head: NCAT Lungs: clear to auscultation bilaterally Heart: LT Posterior tibialis pulse 2+; hair present to LLE; no pallor vs. RLE Extremities: no edema Skin: warm and dry; small area of slightly raised erythema to LLE in oval shape (apx 2 cm in diameter), overlying dry skin, no discharge or bleeding; few excoriations around area Psychological: alert and cooperative; normal mood and affect  ASSESSMENT & PLAN:  1. Rash and nonspecific skin eruption     Meds ordered this encounter  Medications  . mupirocin cream (BACTROBAN) 2 %    Sig: Apply 1 application topically 2 (two) times daily.    Dispense:  15 g    Refill:  0    Order Specific Question:   Supervising Provider    Answer:   Raylene Everts [id number]  . cetirizine (ZYRTEC) 10 MG tablet    Sig: Take 1 tablet (10 mg total) by mouth daily.    Dispense:  15 tablet    Refill:  0    Order Specific Question:   Supervising Provider    Answer:   Raylene Everts [id number]  . calamine lotion    Sig: Apply 1 application topically as needed for itching.    Dispense:  120 mL    Refill:  0    Order Specific Question:   Supervising Provider    Answer:   Raylene Everts [id number]   Zyrtec prescribed for itching Calamine lotion prescribed for itching as well Bactroban prescribed to prevent secondary infection Moisturize skin daily  Follow up with PCP in 1-2 weeks for recheck and to ensure symptoms are improving Return or go to the ER if you have any new or worsening symptoms such as fever, chills, nausea, vomiting, redness, swelling, discharge, if symptoms do not improve with medications, etc...  Reviewed expectations re: course of current medical issues. Questions answered. Outlined signs and symptoms indicating need for more acute intervention. Patient verbalized understanding. After Visit Summary given.   Lestine Box,  PA-C 04/07/19 [phone]

## 2019-04-07 NOTE — ED Triage Notes (Signed)
Pt presents with rash on left leg that began a couple days ago

## 2019-04-11 ENCOUNTER — Telehealth (INDEPENDENT_AMBULATORY_CARE_PROVIDER_SITE_OTHER): Payer: Self-pay

## 2019-04-11 NOTE — Telephone Encounter (Signed)
[facility] R[facility] can accept this patient into the practice.  Please make an appointment for her to see Judson Roch.

## 2019-04-12 NOTE — Telephone Encounter (Signed)
Set appt with sarah

## 2019-04-19 ENCOUNTER — Encounter (INDEPENDENT_AMBULATORY_CARE_PROVIDER_SITE_OTHER): Payer: Self-pay | Admitting: Nurse Practitioner

## 2019-04-19 ENCOUNTER — Other Ambulatory Visit: Payer: Self-pay

## 2019-04-19 ENCOUNTER — Encounter (INDEPENDENT_AMBULATORY_CARE_PROVIDER_SITE_OTHER): Payer: Self-pay

## 2019-04-19 ENCOUNTER — Ambulatory Visit (INDEPENDENT_AMBULATORY_CARE_PROVIDER_SITE_OTHER): Payer: Medicare Other | Admitting: Nurse Practitioner

## 2019-04-19 VITALS — BP 120/78 | HR 78 | Temp 98.2°F | Ht 60.0 in | Wt 186.4 lb

## 2019-04-19 DIAGNOSIS — Z139 Encounter for screening, unspecified: Secondary | ICD-10-CM | POA: Diagnosis not present

## 2019-04-19 DIAGNOSIS — R21 Rash and other nonspecific skin eruption: Secondary | ICD-10-CM | POA: Insufficient documentation

## 2019-04-19 DIAGNOSIS — R0602 Shortness of breath: Secondary | ICD-10-CM

## 2019-04-19 DIAGNOSIS — Z131 Encounter for screening for diabetes mellitus: Secondary | ICD-10-CM

## 2019-04-19 DIAGNOSIS — L709 Acne, unspecified: Secondary | ICD-10-CM | POA: Diagnosis not present

## 2019-04-19 DIAGNOSIS — G8929 Other chronic pain: Secondary | ICD-10-CM

## 2019-04-19 DIAGNOSIS — R5383 Other fatigue: Secondary | ICD-10-CM | POA: Diagnosis not present

## 2019-04-19 DIAGNOSIS — Z0001 Encounter for general adult medical examination with abnormal findings: Secondary | ICD-10-CM | POA: Diagnosis not present

## 2019-04-19 DIAGNOSIS — M546 Pain in thoracic spine: Secondary | ICD-10-CM

## 2019-04-19 DIAGNOSIS — Z13220 Encounter for screening for lipoid disorders: Secondary | ICD-10-CM

## 2019-04-19 MED ORDER — DOXYCYCLINE HYCLATE 100 MG PO TABS: 100.0000 mg | ORAL_TABLET | Freq: Two times a day (BID) | ORAL | 0 refills | Status: DC

## 2019-04-19 NOTE — Progress Notes (Signed)
   Subjective:  Patient ID: Nancy Schroeder, female    DOB: 01/03/1934  Age: 85 y.o. MRN: [id number]  CC:  Chief Complaint  Patient presents with  . Establish Care  . Leg Pain    swelling in the left leg      HPI  This patient comes in today for the above.  Establish care: She is here to establish care.  She recently moved to this area from [place].  She was seen in the urgent care last week for a rash, at that time they recommended establishing care with us per her primary care.  Leg pain/rash: She was seen in urgent care on 04/07/2019 (approximately 12 days ago) for rash, pain, swelling to her left leg.  At that time she was prescribed Zyrtec, Bactroban ointment, and calamine lotion.  She has been using the calamine lotion as well as her Zyrtec, she tells me she was not ever able to fill the Bactroban ointment.  She tells me her rash is improving, her swelling has resolved, and the pain has resolved.  Tells me right prior to the rash eruption she did experience a burning, itching sensation.  She tells me she also is under a lot of stress.  Acne: She complains of acne on her face and nose, and wonders what she can do to treat this.  Shortness of breath/fatigue: She also tells me she is been experiencing some shortness of breath and fatigue for the past year.  She tells me it occurs when she is participating in prolonged physical activity such as standing for for prolonged periods while peeling potatoes.  She tells me she does not sleep well and is fatigued often during the day.  She denies any orthopnea or paroxysmal nocturnal dyspnea.  She denies any cough or wheezing.  She denies any chest pain.  As stated above, she was experiencing some lower extremity swelling to her left leg, but this has resolved.  She denies any additional edema.  She is also concerned of weight gain over the past few years.  Back pain: She also has chronic back pain of the cervical and thoracic spine.   This started after an accident in 2016.  She has been evaluated by specialist and did undergo surgery on her cervical spine in the past, she has not undergone surgery of her thoracic spine.  She is trying to hold off on surgery if possible.  She has experienced pain relief with the use of injected medication.  It sounds like she may have had steroid injections in the past.  The pain does radiate to her left leg at times and does result in a cold sensation to that left leg.  She has undergone ABI within the last year and this showed normal blood flow to bilateral lower extremities.  She tells me she is also worked with physical therapy without pain relief.   Past Medical History:  Diagnosis Date  . Anxiety   . Depression   . H/O back injury 2016  . Hypertension       Family History  Problem Relation Age of Onset  . Diabetes Mother   . Diabetes Father   . Diabetes Sister     Social History   Social History Narrative   Live with Wally-son (middle)   Has 3 sons   Oldest lives in    Youngest lives in [place] (with 1 grandchild)   3 cats: lily-16 , troubles, whiskers (boys-5 years)        Enjoyed sewing, and knitting, but back injury made this worse. Read, tv, putt around, likes astrology      Diet: Eats what she wants. Enjoys veggies and fruits   Caffeine: 1-2 cups of coffee, iced tea   Water: 6-8 cups daily       Wear seatbelt   Does not wear sunscreen   Smoke and carbon monoxide detectors         Social History   Tobacco Use  . Smoking status: Never Smoker  . Smokeless tobacco: Never Used  Substance Use Topics  . Alcohol use: Yes     Current Meds  Medication Sig  . busPIRone (BUSPAR) 10 MG tablet Take 10 mg by mouth daily as needed.  . diphenhydrAMINE (BENADRYL ALLERGY) 25 mg capsule Take 25 mg by mouth at bedtime as needed.  . hydrochlorothiazide (HYDRODIURIL) 25 MG tablet Take 25 mg by mouth daily.  Marland Kitchen lisinopril (ZESTRIL) 40 MG tablet Take 40 mg by mouth  daily.  . mupirocin cream (BACTROBAN) 2 % Apply 1 application topically 2 (two) times daily.    ROS:  Negative unless otherwise stated in HPI.   Objective:   Today's Vitals: BP 120/78 (BP Location: Right Arm, Patient Position: Sitting, Cuff Size: Normal)   Pulse 78   Temp 98.2 F (36.8 C) (Temporal)   Ht 5' (1.524 m)   Wt 186 lb 6.4 oz (84.6 kg)   SpO2 98%   BMI 36.40 kg/m  Vitals with BMI 04/19/2019 04/07/2019 02/06/2019  Height 5' 0" - 5' 0"  Weight 186 lbs 6 oz - 172 lbs 1 oz  BMI 13.2 - 44.01  Systolic 027 253 664  Diastolic 78 84 70  Pulse 78 85 88     Physical Exam Vitals reviewed.  Constitutional:      General: She is not in acute distress.    Appearance: Normal appearance.  HENT:     Head: Normocephalic and atraumatic.  Neck:     Vascular: No carotid bruit.  Cardiovascular:     Rate and Rhythm: Normal rate and regular rhythm.     Pulses: Normal pulses.          Dorsalis pedis pulses are 2+ on the right side and 2+ on the left side.     Heart sounds: Normal heart sounds.  Pulmonary:     Effort: Pulmonary effort is normal.     Breath sounds: Normal breath sounds.  Musculoskeletal:     Right lower leg: No edema.     Left lower leg: No edema.  Feet:     Right foot:     Skin integrity: Skin integrity normal.     Left foot:     Skin integrity: Skin integrity normal. No erythema or warmth.  Skin:    General: Skin is warm and dry.     Findings: Rash present. Rash is macular.       Neurological:     General: No focal deficit present.     Mental Status: She is alert and oriented to person, place, and time.  Psychiatric:        Mood and Affect: Mood normal.        Behavior: Behavior normal.        Judgment: Judgment normal.          Assessment and Plan   1. Acne, unspecified acne type   2. Fatigue, unspecified type   3. Encounter for general adult medical examination with abnormal findings   4.  Screening for condition   5. Screening, lipid   6.  Screening for diabetes mellitus   7. Shortness of breath   8. Chronic bilateral thoracic back pain   9. Rash and nonspecific skin eruption      Plan: 1.  I will prescribe her a course of doxycycline for treatment.  She was encouraged to let me know if her symptoms worsen or do not resolve.  2.,  3.,  4.,  5.,  6.,  7.  We will start with collecting blood work for further evaluation.  Current etiology of his shortness of breath is uncertain.  I wonder if her thyroid may be hypoactive.  May also need to consider echocardiogram for further evaluation pending blood work.  8.  For now I have recommended that she use Tylenol and Aleve for pain management.  I recommended that she take 500 to 650 mg of Tylenol every 8 hours for the next 2 weeks, and for breakthrough pain try Aleve.  If symptoms continue may need to consider referral to spine specialist.  9.  This seems to be resolving.  I told her she can stop using the medications are prescribed to her at urgent care as long as her symptoms continue to improve.  History of unilateral swelling does concern me for possible blood clot, I offered to send her to have ultrasound completed.  However because her symptoms are resolving and swelling is not present today, she and I both decided to hold off on further imaging for right now.  She is aware that she needs to notify me right away if her symptoms return.  I also wonder if she may have had a shingles infection based on her history of burning and pain prior to rash eruption.  Either way rash seems to be improving and we will monitor closely for now.   Tests ordered Orders Placed This Encounter  Procedures  . CBC  . CMP with eGFR([facility])  . Lipid Panel  . Hemoglobin A1c  . TSH  . T3, Free  . T4, Free  . Vitamin D, 25-hydroxy      Meds ordered this encounter  Medications  . doxycycline (VIBRA-TABS) 100 MG tablet    Sig: Take 1 tablet (100 mg total) by mouth 2 (two) times daily.    Dispense:   28 tablet    Refill:  0    Order Specific Question:   Supervising Provider    Answer:   Doree Albee [[id number]]    Patient to follow-up in 1 month or sooner if needed  Ailene Ards, NP

## 2019-04-19 NOTE — Patient Instructions (Signed)
Thank you for choosing [facility] as your medical provider! If you have any questions or concerns regarding your health care, please do not hesitate to call our office.  Back pain: For pain management take 500-650mg  of Tylenol by mouth every 8 hours for approximately 2 weeks.  If you experience pain despite taking the Tylenol take 1 tablet of Aleve by mouth per day as needed.  Do not mix Aleve and ibuprofen.  If you have any worrisome symptoms such as weakness, new or worsening sensation changes, numbness or tingling in your groin, or severe pain please proceed to the emergency department.  Acne: I have prescribed doxycycline to treat her acne.  Take 1 tablet by mouth twice a day for 2 weeks.  Shortness of breath/fatigue: I will collect blood work for further evaluation today.  We will discuss this in further detail at your next office visit, we may need to proceed with further evaluation via tests, but first I will see what your blood work shows.  Rash: You may discontinue the medications prescribed for your rash on your leg on Saturday.  If your symptoms worsen or reoccur, please let me know.  I would especially like to be notified of any swelling/pain to the left leg only.  Please follow-up as scheduled in 1 month. We look forward to seeing you again soon!   At [facility] we value your feedback. You may receive a survey about your visit today. Please share your experience as we strive to create trusting relationships with our patients to provide genuine, compassionate, quality care.  We appreciate your understanding and patience as we review any laboratory studies, imaging, and other diagnostic tests that are ordered as we care for you. We do our best to address any and all results in a timely manner. If you do not hear about test results within 1 week, please do not hesitate to contact us. If we referred you to a specialist during your visit or ordered imaging testing,  contact the office if you have not been contacted to be scheduled within 1 weeks.  We also encourage the use of MyChart, which contains your medical information for your review as well. If you are not enrolled in this feature, an access code is on this after visit summary for your convenience. Thank you for allowing [place] to be involved in your care.

## 2019-04-20 LAB — COMPLETE METABOLIC PANEL WITH GFR
AG Ratio: 1.6 (calc) (ref 1.0–2.5)
ALT: 20 U/L (ref 6–29)
AST: 17 U/L (ref 10–35)
Albumin: 3.8 g/dL (ref 3.6–5.1)
Alkaline phosphatase (APISO): 59 U/L (ref 37–153)
BUN: 12 mg/dL (ref 7–25)
CO2: 23 mmol/L (ref 20–32)
Calcium: 9.4 mg/dL (ref 8.6–10.4)
Chloride: 109 mmol/L (ref 98–110)
Creat: 0.78 mg/dL (ref 0.60–0.88)
GFR, Est African American: 81 mL/min/{1.73_m2} (ref >=60)
GFR, Est Non African American: 70 mL/min/{1.73_m2} (ref >=60)
Globulin: 2.4 g/dL (calc) (ref 1.9–3.7)
Glucose, Bld: 84 mg/dL (ref 65–99)
Potassium: 4.2 mmol/L (ref 3.5–5.3)
Sodium: 141 mmol/L (ref 135–146)
Total Bilirubin: 0.4 mg/dL (ref 0.2–1.2)
Total Protein: 6.2 g/dL (ref 6.1–8.1)

## 2019-04-20 LAB — T4, FREE: Free T4: 1.2 ng/dL (ref 0.8–1.8)

## 2019-04-20 LAB — CBC
HCT: 38.9 % (ref 35.0–45.0)
Hemoglobin: 13.1 g/dL (ref 11.7–15.5)
MCH: 31.6 pg (ref 27.0–33.0)
MCHC: 33.7 g/dL (ref 32.0–36.0)
MCV: 93.7 fL (ref 80.0–100.0)
MPV: 10 fL (ref 7.5–12.5)
Platelets: 280 10*3/uL (ref 140–400)
RBC: 4.15 10*6/uL (ref 3.80–5.10)
RDW: 11.9 % (ref 11.0–15.0)
WBC: 8.1 10*3/uL (ref 3.8–10.8)

## 2019-04-20 LAB — TSH: TSH: 5.05 mIU/L — ABNORMAL HIGH (ref 0.40–4.50)

## 2019-04-20 LAB — T3, FREE: T3, Free: 3.1 pg/mL (ref 2.3–4.2)

## 2019-04-23 ENCOUNTER — Other Ambulatory Visit (INDEPENDENT_AMBULATORY_CARE_PROVIDER_SITE_OTHER): Payer: Self-pay | Admitting: Internal Medicine

## 2019-04-23 ENCOUNTER — Telehealth (INDEPENDENT_AMBULATORY_CARE_PROVIDER_SITE_OTHER): Payer: Self-pay

## 2019-04-23 DIAGNOSIS — L709 Acne, unspecified: Secondary | ICD-10-CM

## 2019-04-23 DIAGNOSIS — R21 Rash and other nonspecific skin eruption: Secondary | ICD-10-CM

## 2019-04-23 NOTE — Telephone Encounter (Signed)
Judson Roch saw this patient and she did not mention the diagnosis of shingles.  I think that if her rash is getting worse, she will need to see dermatology.  I will put the order in for referral, please refer her.  Thanks.

## 2019-04-23 NOTE — Progress Notes (Signed)
Ref dermatology

## 2019-04-23 NOTE — Telephone Encounter (Signed)
Nancy Schroeder is stating that the shingles is getting close to her vaginal area and she states that shes feeling like they are on the inside she feels like something Is pricking her all over like needles, please advise?

## 2019-05-08 ENCOUNTER — Ambulatory Visit: Payer: Medicare Other | Admitting: Family Medicine

## 2019-05-29 ENCOUNTER — Other Ambulatory Visit: Payer: Self-pay

## 2019-05-29 ENCOUNTER — Ambulatory Visit (INDEPENDENT_AMBULATORY_CARE_PROVIDER_SITE_OTHER): Payer: Medicare Other | Admitting: Internal Medicine

## 2019-05-29 ENCOUNTER — Encounter (INDEPENDENT_AMBULATORY_CARE_PROVIDER_SITE_OTHER): Payer: Self-pay | Admitting: Internal Medicine

## 2019-05-29 VITALS — BP 155/75 | HR 94 | Temp 97.6°F | Ht 60.0 in | Wt 174.4 lb

## 2019-05-29 DIAGNOSIS — M79605 Pain in left leg: Secondary | ICD-10-CM

## 2019-05-29 DIAGNOSIS — M545 Low back pain, unspecified: Secondary | ICD-10-CM

## 2019-05-29 DIAGNOSIS — G8929 Other chronic pain: Secondary | ICD-10-CM

## 2019-05-29 DIAGNOSIS — E039 Hypothyroidism, unspecified: Secondary | ICD-10-CM | POA: Diagnosis not present

## 2019-05-29 DIAGNOSIS — I739 Peripheral vascular disease, unspecified: Secondary | ICD-10-CM

## 2019-05-29 MED ORDER — IBUPROFEN 800 MG PO TABS: 800.0000 mg | ORAL_TABLET | Freq: Every day | ORAL | 0 refills | Status: DC | PRN

## 2019-05-29 MED ORDER — THYROID 30 MG PO TABS: 30.0000 mg | ORAL_TABLET | Freq: Every day | ORAL | 3 refills | Status: DC

## 2019-05-29 NOTE — Progress Notes (Signed)
Metrics: Intervention Frequency ACO  Documented Smoking Status Yearly  Screened one or more times in 24 months  Cessation Counseling or  Active cessation medication Past 24 months  Past 24 months   Guideline developer: UpToDate (See UpToDate for funding source) Date Released: 2014       Wellness Office Visit  Subjective:  Patient ID: Nancy Schroeder, female    DOB: 11/24/1933  Age: 86 y.o. MRN: [id number]  CC: This lady comes in for follow-up regarding her chronic conditions and symptoms. HPI  She has a history of hypertension and continues on ACE inhibitor use. She had blood work done about 6 weeks ago and her TSH was elevated.  She does have symptoms of thyroid deficiency and hypothyroidism. She is complaining of low back pain which is chronic in nature.  When she was in [place], she had several MRI scans of her lower back which were apparently abnormal.  She will try to locate the scans. She is also complaining of coldness and some degree of discomfort in the left lower leg.  This has been present for almost a year.  She feels that her leg becomes extremely cold at times. Past Medical History:  Diagnosis Date  . Anxiety   . Depression   . H/O back injury 2016  . Hypertension       Family History  Problem Relation Age of Onset  . Diabetes Mother   . Diabetes Father   . Diabetes Sister     Social History   Social History Narrative   Live with Insurance underwriter (middle)   Has 3 sons   Oldest lives in [place] lives in [place] (with 1 grandchild)   3 cats: lily-16 , troubles, whiskers (boys-5 years)      Enjoyed sewing, and knitting, but back injury made this worse. Read, tv, putt around, likes astrology      Diet: Eats what she wants. Enjoys veggies and fruits   Caffeine: 1-2 cups of coffee, iced tea   Water: 6-8 cups daily       Wear seatbelt   Does not wear sunscreen   Smoke and carbon monoxide detectors         Social History   Tobacco  Use  . Smoking status: Never Smoker  . Smokeless tobacco: Never Used  Substance Use Topics  . Alcohol use: Yes    Current Meds  Medication Sig  . busPIRone (BUSPAR) 10 MG tablet Take 10 mg by mouth daily as needed.  . diphenhydrAMINE (BENADRYL ALLERGY) 25 mg capsule Take 25 mg by mouth at bedtime as needed.  . hydrochlorothiazide (HYDRODIURIL) 25 MG tablet Take 25 mg by mouth daily.  Marland Kitchen lisinopril (ZESTRIL) 40 MG tablet Take 40 mg by mouth daily.  . mupirocin cream (BACTROBAN) 2 % Apply 1 application topically 2 (two) times daily.  . [DISCONTINUED] doxycycline (VIBRA-TABS) 100 MG tablet Take 1 tablet (100 mg total) by mouth 2 (two) times daily.      Objective:   Today's Vitals: BP (!) 155/75 (BP Location: Left Arm, Patient Position: Sitting, Cuff Size: Normal)   Pulse 94   Temp 97.6 F (36.4 C) (Temporal)   Ht 5' (1.524 m)   Wt 174 lb 6.4 oz (79.1 kg)   SpO2 95%   BMI 34.06 kg/m  Vitals with BMI 05/29/2019 04/19/2019 04/07/2019  Height 5\' 0"  5\' 0"  -  Weight 174 lbs 6 oz 186 lbs 6 oz -  BMI [id number] -  Systolic  [id number]  Diastolic 75 78 84  Pulse 94 78 85     Physical Exam She is obese.  She has lost weight since the last visit.  Examination of her left lower leg shows that it is slightly colder than the right leg.  Capillary refill is reduced.  Dorsalis pedis pulse is not as strong as the right leg.      Assessment   1. Pain of left lower extremity   2. PVD (peripheral vascular disease) ([place])   3. Chronic bilateral low back pain without sciatica   4. Hypothyroidism, adult       Tests ordered Orders Placed This Encounter  Procedures  . Ambulatory referral to Vascular Surgery     Plan: 1. It is possible that she has peripheral vascular disease on the left side and I will send her to vascular surgery for further evaluation. 2. As far as her chronic low back pain is concerned, temporarily, I will prescribe her ibuprofen 800 mg which she should use  judiciously and only with food. 3. She has hypothyroidism and we will start treatment with NP thyroid and I have described possible side effects. 4. We also discussed the importance of losing some weight which would actually help her lower back also and we will discuss this on the next visit but in the meantime, I have encouraged her to drink 80 to 90 ounces of water every day. 5. She will follow up with me in about 6 weeks time and we will see how she is doing at that point. 6. Today I spent 30 minutes with this patient discussing all of the above.   Meds ordered this encounter  Medications  . thyroid (NP THYROID) 30 MG tablet    Sig: Take 1 tablet (30 mg total) by mouth daily before breakfast.    Dispense:  30 tablet    Refill:  3  . ibuprofen (ADVIL) 800 MG tablet    Sig: Take 1 tablet (800 mg total) by mouth daily as needed.    Dispense:  30 tablet    Refill:  0    Celsa Nordahl Luther Parody, MD

## 2019-05-31 ENCOUNTER — Ambulatory Visit (INDEPENDENT_AMBULATORY_CARE_PROVIDER_SITE_OTHER): Payer: Medicare Other | Admitting: Nurse Practitioner

## 2019-06-06 ENCOUNTER — Ambulatory Visit (INDEPENDENT_AMBULATORY_CARE_PROVIDER_SITE_OTHER): Payer: Medicare Other | Admitting: Podiatry

## 2019-06-06 ENCOUNTER — Other Ambulatory Visit: Payer: Self-pay

## 2019-06-06 VITALS — Temp 97.2°F

## 2019-06-06 DIAGNOSIS — L853 Xerosis cutis: Secondary | ICD-10-CM

## 2019-06-06 DIAGNOSIS — B351 Tinea unguium: Secondary | ICD-10-CM | POA: Diagnosis not present

## 2019-06-06 DIAGNOSIS — M79675 Pain in left toe(s): Secondary | ICD-10-CM | POA: Diagnosis not present

## 2019-06-06 DIAGNOSIS — I739 Peripheral vascular disease, unspecified: Secondary | ICD-10-CM | POA: Diagnosis not present

## 2019-06-06 DIAGNOSIS — M79674 Pain in right toe(s): Secondary | ICD-10-CM

## 2019-06-07 ENCOUNTER — Encounter: Payer: Self-pay | Admitting: Podiatry

## 2019-06-07 NOTE — Progress Notes (Signed)
  Subjective:  Patient ID: Nancy Schroeder, female    DOB: 05-Apr-1933,  MRN: [id number]  Chief Complaint  Patient presents with  . Nail Problem    Thick, long toenails.   86 y.o. female returns for the above complaint.  Patient states that she is here for bilateral painful mycotic thickened toenails.  She is unable to trim the toenails down herself.  She also has a secondary complaint of claudication type of pain to the left side.  She states that she is unable to walk more than half a block without resting.  She states that at night she gets intermittent claudication pain.  She also states that she gets charley horses to the back of the calf.  She denies any other acute complaints.  Objective:   Vitals:   06/06/19 0930  Temp: (!) 97.2 F (36.2 C)   Podiatric Exam: Vascular: dorsalis pedis and posterior tibial pulses are diminished bilateral. Capillary return is delayed temperature gradient is WNL. Skin turgor WNL  Sensorium: Normal Semmes Weinstein monofilament test. Normal tactile sensation bilaterally. Nail Exam: Pt has thick disfigured discolored nails with subungual debris noted bilateral entire nail hallux through fifth toenails Ulcer Exam: There is no evidence of ulcer or pre-ulcerative changes or infection. Orthopedic Exam: Muscle tone and strength are WNL. No limitations in general ROM. No crepitus or effusions noted. HAV  B/L.  Hammer toes 2-5  B/L. Skin: No Porokeratosis. No infection or ulcers.  Mild xerosis to the bilateral lower extremity  Assessment & Plan:  Patient was evaluated and treated and all questions answered.  Peripheral vascular disease -Patient is ABIs PVRs were discussed in details.  Patient has good flow to the lower extremity.  We will hold off on any vascular follow-up for vascular intervention for now.  If she develops any soft tissue loss we will consider it at that time.   Xerosis bilateral lower extremity -I explained to the patient the etiology  of xerosis and various treatment options were extensively discussed.  I explained to the patient the importance of maintaining moisturization of the skin with application of over-the-counter lotion such as Eucerin or Luciderm.  I have asked the patient to apply this twice a day.  If unable to resolve patient will benefit from prescription lotion.  Onychomycosis with pain  -Nails palliatively debrided as below. -Educated on self-care  Procedure: Nail Debridement Rationale: pain  Type of Debridement: manual, sharp debridement. Instrumentation: Nail nipper, rotary burr. Number of Nails: 10  Procedures and Treatment: Consent by patient was obtained for treatment procedures. The patient understood the discussion of treatment and procedures well. All questions were answered thoroughly reviewed. Debridement of mycotic and hypertrophic toenails, 1 through 5 bilateral and clearing of subungual debris. No ulceration, no infection noted.  Return Visit-Office Procedure: Patient instructed to return to the office for a follow up visit 3 months for continued evaluation and treatment.  Boneta Lucks, DPM    No follow-ups on file.

## 2019-06-18 ENCOUNTER — Telehealth (INDEPENDENT_AMBULATORY_CARE_PROVIDER_SITE_OTHER): Payer: Self-pay

## 2019-06-19 ENCOUNTER — Telehealth (INDEPENDENT_AMBULATORY_CARE_PROVIDER_SITE_OTHER): Payer: Self-pay

## 2019-06-19 ENCOUNTER — Encounter (INDEPENDENT_AMBULATORY_CARE_PROVIDER_SITE_OTHER): Payer: Self-pay

## 2019-06-19 NOTE — Telephone Encounter (Signed)
Called this morning to contacts in demographics. No answer from either numbers today as well. Will send a letter out today to contact office.

## 2019-06-19 NOTE — Telephone Encounter (Signed)
Sonya & I discussed the 3x attempts to call pt to setup appt per provider. We will keep the referrla open for 30 day until we can contact. Pt has a appt w/ [facility] on 07/11/19 @ 10:30am We will update her information and set her appt with [facility] on that day.ph:[phone],opt2. Davy Pique

## 2019-06-19 NOTE — Telephone Encounter (Signed)
I guess try sending letter or try calling patient's emergency contact and ask him to have her call us and vein and vascular.

## 2019-06-21 ENCOUNTER — Telehealth (INDEPENDENT_AMBULATORY_CARE_PROVIDER_SITE_OTHER): Payer: Self-pay | Admitting: Internal Medicine

## 2019-06-21 ENCOUNTER — Other Ambulatory Visit (INDEPENDENT_AMBULATORY_CARE_PROVIDER_SITE_OTHER): Payer: Self-pay | Admitting: Internal Medicine

## 2019-06-21 MED ORDER — HYDROCHLOROTHIAZIDE 25 MG PO TABS: 25.0000 mg | ORAL_TABLET | Freq: Every day | ORAL | 1 refills | Status: DC

## 2019-06-21 MED ORDER — LISINOPRIL 40 MG PO TABS: 40.0000 mg | ORAL_TABLET | Freq: Every day | ORAL | 1 refills | Status: DC

## 2019-06-25 ENCOUNTER — Other Ambulatory Visit (INDEPENDENT_AMBULATORY_CARE_PROVIDER_SITE_OTHER): Payer: Self-pay | Admitting: Internal Medicine

## 2019-06-25 NOTE — Telephone Encounter (Signed)
Done

## 2019-07-11 ENCOUNTER — Encounter (INDEPENDENT_AMBULATORY_CARE_PROVIDER_SITE_OTHER): Payer: Self-pay | Admitting: Internal Medicine

## 2019-07-11 ENCOUNTER — Ambulatory Visit (INDEPENDENT_AMBULATORY_CARE_PROVIDER_SITE_OTHER): Payer: Medicare Other | Admitting: Internal Medicine

## 2019-07-11 ENCOUNTER — Other Ambulatory Visit: Payer: Self-pay

## 2019-07-11 VITALS — BP 144/90 | Resp 18 | Ht 60.0 in | Wt 173.0 lb

## 2019-07-11 DIAGNOSIS — I739 Peripheral vascular disease, unspecified: Secondary | ICD-10-CM

## 2019-07-11 DIAGNOSIS — M545 Low back pain: Secondary | ICD-10-CM

## 2019-07-11 DIAGNOSIS — E669 Obesity, unspecified: Secondary | ICD-10-CM

## 2019-07-11 DIAGNOSIS — G8929 Other chronic pain: Secondary | ICD-10-CM

## 2019-07-11 DIAGNOSIS — E039 Hypothyroidism, unspecified: Secondary | ICD-10-CM | POA: Diagnosis not present

## 2019-07-11 NOTE — Patient Instructions (Signed)
[facility] Dietary Recommendations for Weight Loss What to Avoid . Avoid added sugars o Often added sugar can be found in processed foods such as many condiments, dry cereals, cakes, cookies, chips, crisps, crackers, candies, sweetened drinks, etc.  o Read labels and AVOID/DECREASE use of foods with the following in their ingredient list: Sugar, fructose, high fructose corn syrup, sucrose, glucose, maltose, dextrose, molasses, cane sugar, brown sugar, any type of syrup, agave nectar, etc.   . Avoid snacking in between meals . Avoid foods made with flour o If you are going to eat food made with flour, choose those made with whole-grains; and, minimize your consumption as much as is tolerable . Avoid processed foods o These foods are generally stocked in the middle of the grocery store. Focus on shopping on the perimeter of the grocery.  . Avoid Meat  o We recommend following a plant-based diet at [facility]. Thus, we recommend avoiding meat as a general rule. Consider eating beans, legumes, eggs, and/or dairy products for regular protein sources o If you plan on eating meat limit to 4 ounces of meat at a time and choose lean options such as Fish, chicken, turkey. Avoid red meat intake such as pork and/or steak What to Include . Vegetables o GREEN LEAFY VEGETABLES: Kale, spinach, mustard greens, collard greens, cabbage, broccoli, etc. o OTHER: Asparagus, cauliflower, eggplant, carrots, peas, Brussel sprouts, tomatoes, bell peppers, zucchini, beets, cucumbers, etc. . Grains, seeds, and legumes o Beans: kidney beans, black eyed peas, garbanzo beans, black beans, pinto beans, etc. o Whole, unrefined grains: brown rice, barley, bulgur, oatmeal, etc. . Healthy fats  o Avoid highly processed fats such as vegetable oil o Examples of healthy fats: avocado, olives, virgin olive oil, dark chocolate (?72% Cocoa), nuts (peanuts, almonds, walnuts, cashews, pecans, etc.) . None to Low  Intake of Animal Sources of Protein o Meat sources: chicken, turkey, salmon, tuna. Limit to 4 ounces of meat at one time. o Consider limiting dairy sources, but when choosing dairy focus on: PLAIN Greek yogurt, cottage cheese, high-protein milk . Fruit o Choose berries  When to Eat . Intermittent Fasting: o Choosing not to eat for a specific time period, but DO FOCUS ON HYDRATION when fasting o Multiple Techniques: - Time Restricted Eating: eat 3 meals in a day, each meal lasting no more than 60 minutes, no snacks between meals - 16-18 hour fast: fast for 16 to 18 hours up to 7 days a week. Often suggested to start with 2-3 nonconsecutive days per week.  . Remember the time you sleep is counted as fasting.  . Examples of eating schedule: Fast from 7:00pm-11:00am. Eat between 11:00am-7:00pm.  - 24-hour fast: fast for 24 hours up to every other day. Often suggested to start with 1 day per week . Remember the time you sleep is counted as fasting . Examples of eating schedule:  o Eating day: eat 2-3 meals on your eating day. If doing 2 meals, each meal should last no more than 90 minutes. If doing 3 meals, each meal should last no more than 60 minutes. Finish last meal by 7:00pm. o Fasting day: Fast until 7:00pm.  o IF YOU FEEL UNWELL FOR ANY REASON/IN ANY WAY WHEN FASTING, STOP FASTING BY EATING A NUTRITIOUS SNACK OR LIGHT MEAL o ALWAYS FOCUS ON HYDRATION DURING FASTS - Acceptable Hydration sources: water, broths, tea/coffee (black tea/coffee is best but using a small amount of whole-fat dairy products in coffee/tea is acceptable).  -   Poor Hydration Sources: anything with sugar or artificial sweeteners added to it  These recommendations have been developed for patients that are actively receiving medical care from either Dr. Bronnie Vasseur or Sarah Gray, DNP, NP-C at [facility]. These recommendations are developed for patients with specific medical conditions and are not meant to be  distributed or used by others that are not actively receiving care from either provider listed above at [facility]. It is not appropriate to participate in the above eating plans without proper medical supervision.   Reference: Fung, J. The obesity code. [place]: [place]; 2016.   

## 2019-07-11 NOTE — Progress Notes (Signed)
Metrics: Intervention Frequency ACO  Documented Smoking Status Yearly  Screened one or more times in 24 months  Cessation Counseling or  Active cessation medication Past 24 months  Past 24 months   Guideline developer: UpToDate (See UpToDate for funding source) Date Released: 2014       Wellness Office Visit  Subjective:  Patient ID: Nancy Schroeder, female    DOB: January 16, 1934  Age: 86 y.o. MRN: [id number]  CC: This lady comes in for follow-up of hypothyroidism, back and leg pain.  She also has obesity. HPI  On the last visit, I discussed with her starting NP thyroid for hypothyroidism.  She tells me now that she did not start it because she was afraid to start it regarding problems with her heart. She is also complaining of low back pain radiating to the right leg.  I did briefly look at MRI scans that were done previously and that was somewhat abnormal.  She is only taking ibuprofen at the present time which she is tolerating and does give her some relief.  She cannot tolerate tramadol and she certainly does not want to take any other narcotics. She also wants to lose weight. Past Medical History:  Diagnosis Date  . Anxiety   . Depression   . H/O back injury 2016  . Hypertension    Past Surgical History:  Procedure Laterality Date  . ABDOMINAL HYSTERECTOMY    . CHOLECYSTECTOMY    . NECK SURGERY       Family History  Problem Relation Age of Onset  . Diabetes Mother   . Diabetes Father   . Diabetes Sister     Social History   Social History Narrative   Live with Insurance underwriter (middle)   Has 3 sons   Oldest lives in [place] lives in [place] (with 1 grandchild)   3 cats: lily-16 , troubles, whiskers (boys-5 years)      Enjoyed sewing, and knitting, but back injury made this worse. Read, tv, putt around, likes astrology      Diet: Eats what she wants. Enjoys veggies and fruits   Caffeine: 1-2 cups of coffee, iced tea   Water: 6-8 cups daily       Wear  seatbelt   Does not wear sunscreen   Smoke and carbon monoxide detectors         Social History   Tobacco Use  . Smoking status: Never Smoker  . Smokeless tobacco: Never Used  Substance Use Topics  . Alcohol use: Yes    Current Meds  Medication Sig  . busPIRone (BUSPAR) 10 MG tablet Take 10 mg by mouth daily as needed.  . diphenhydrAMINE (BENADRYL ALLERGY) 25 mg capsule Take 25 mg by mouth at bedtime as needed.  . hydrochlorothiazide (HYDRODIURIL) 25 MG tablet Take 1 tablet (25 mg total) by mouth daily.  Marland Kitchen ibuprofen (ADVIL) 800 MG tablet TAKE 1 TABLET(800 MG) BY MOUTH DAILY AS NEEDED  . lisinopril (ZESTRIL) 40 MG tablet Take 1 tablet (40 mg total) by mouth daily.  . mupirocin cream (BACTROBAN) 2 % Apply 1 application topically 2 (two) times daily.  Marland Kitchen thyroid (NP THYROID) 30 MG tablet Take 1 tablet (30 mg total) by mouth daily before breakfast.       Depression screen [facility] 2/9 04/19/2019 02/06/2019 11/01/2018 08/02/2018  Decreased Interest 0 0 0 0  Down, Depressed, Hopeless 0 1 2 1   PHQ - 2 Score 0 1 2 1   Altered sleeping - - 0 -  Tired, decreased energy - - 3 -  Change in appetite - - 3 -  Feeling bad or failure about yourself  - - 3 -  Trouble concentrating - - 1 -  Moving slowly or fidgety/restless - - 0 -  Suicidal thoughts - - 0 -  PHQ-9 Score - - 12 -     Objective:   Today's Vitals: BP (!) 144/90 (BP Location: Right Arm, Patient Position: Sitting, Cuff Size: Normal) Comment: she is in pain.  Resp 18   Ht 5' (1.524 m)   Wt 173 lb (78.5 kg)   BMI 33.79 kg/m  Vitals with BMI 07/11/2019 05/29/2019 04/19/2019  Height 5\' 0"  5\' 0"  5\' 0"   Weight 173 lbs 174 lbs 6 oz 186 lbs 6 oz  BMI 33.79 40.97 35.3  Systolic 299 242 683  Diastolic 90 75 78  Pulse - 94 78     Physical Exam  She does not appear to be in acute pain.  Her weight is stable.  She remains obese.  Blood pressure somewhat elevated today.     Assessment   1. Hypothyroidism, adult   2. Chronic  bilateral low back pain without sciatica   3. PVD (peripheral vascular disease) (HCC)   4. Obesity (BMI 30.0-34.9)       Tests ordered Orders Placed This Encounter  Procedures  . Ambulatory referral to Neurosurgery     Plan: 1. I will refer to neurosurgery for further evaluation and any recommendations.  She definitely does not want any surgery and I told her that she does not have to have any surgery but the neurosurgeon may be able to give some recommendations to help her. 2. As far as her hypothyroidism is concerned, I urged her to take the NP thyroid and I did prescribe and reassured her that NP thyroid does not cause any untoward cardiac effects based on several studies. 3. We also discussed her obesity and nutrition.  I told her to drink plenty of water every day and for now I told her to do intermittent fasting 16 hours every day and I described the reason why in terms of insulin resistance. 4. I will see her in about a month's time for close follow-up to see how she is doing and we will check her thyroid levels then, providing she has been taking the NP thyroid.   No orders of the defined types were placed in this encounter.   Doree Albee, MD

## 2019-07-17 ENCOUNTER — Other Ambulatory Visit: Payer: Self-pay | Admitting: *Deleted

## 2019-07-17 DIAGNOSIS — I739 Peripheral vascular disease, unspecified: Secondary | ICD-10-CM

## 2019-07-22 ENCOUNTER — Other Ambulatory Visit (INDEPENDENT_AMBULATORY_CARE_PROVIDER_SITE_OTHER): Payer: Self-pay | Admitting: Internal Medicine

## 2019-07-24 ENCOUNTER — Other Ambulatory Visit: Payer: Self-pay | Admitting: *Deleted

## 2019-07-24 ENCOUNTER — Encounter: Payer: Self-pay | Admitting: Vascular Surgery

## 2019-07-24 ENCOUNTER — Ambulatory Visit (HOSPITAL_COMMUNITY)
Admission: RE | Admit: 2019-07-24 | Discharge: 2019-07-24 | Disposition: A | Discharge status: Home / Self Care | Payer: Medicare Other | Source: Ambulatory Visit | Attending: Vascular Surgery | Admitting: Vascular Surgery

## 2019-07-24 ENCOUNTER — Other Ambulatory Visit: Payer: Self-pay

## 2019-07-24 ENCOUNTER — Other Ambulatory Visit (HOSPITAL_COMMUNITY): Payer: Self-pay | Admitting: Vascular Surgery

## 2019-07-24 ENCOUNTER — Ambulatory Visit: Payer: Medicare Other | Admitting: Vascular Surgery

## 2019-07-24 ENCOUNTER — Ambulatory Visit (INDEPENDENT_AMBULATORY_CARE_PROVIDER_SITE_OTHER)
Admission: RE | Admit: 2019-07-24 | Discharge: 2019-07-24 | Disposition: A | Discharge status: Home / Self Care | Payer: Medicare Other | Source: Ambulatory Visit | Attending: Nurse Practitioner | Admitting: Nurse Practitioner

## 2019-07-24 VITALS — BP 147/77 | HR 76 | Temp 97.6°F | Resp 20 | Ht 59.0 in | Wt 170.9 lb

## 2019-07-24 DIAGNOSIS — I6523 Occlusion and stenosis of bilateral carotid arteries: Secondary | ICD-10-CM

## 2019-07-24 DIAGNOSIS — I739 Peripheral vascular disease, unspecified: Secondary | ICD-10-CM | POA: Diagnosis not present

## 2019-07-24 DIAGNOSIS — M25562 Pain in left knee: Secondary | ICD-10-CM

## 2019-07-24 DIAGNOSIS — M25561 Pain in right knee: Secondary | ICD-10-CM | POA: Diagnosis not present

## 2019-07-24 DIAGNOSIS — Z8673 Personal history of transient ischemic attack (TIA), and cerebral infarction without residual deficits: Secondary | ICD-10-CM

## 2019-07-24 NOTE — Progress Notes (Signed)
[facility] of [facility]  Patient name: Nancy Schroeder MRN: [id number] DOB: 11-Oct-1933 Sex: female  REASON FOR CONSULT: Evaluate lower extremity discomfort to rule out arterial insufficiency  HPI: Nancy Schroeder is a 86 y.o. female, who is here today for discussion of lower extremity symptoms.  She reports this is been present for approximately 6 months.  This affects her left leg greater than right.  She reports an aching cold cramping discomfort that is not typically related to walking.  She can have some discomfort in her right leg as well but this is not as pronounced as her left leg.  She does not have any history of tissue loss.  No history of arterial rest pain.  She has no history of cardiac disease.  On questioning if she has had a stroke.  She reports an episode 6 months ago where she for several minutes was unable to talk.  She was trying to get her son's attention and this all resolved spontaneously.  She did not have any other focal deficits.  She is right-handed.  Past Medical History:  Diagnosis Date  . Anxiety   . Depression   . H/O back injury 2016  . Hypertension     Family History  Problem Relation Age of Onset  . Diabetes Mother   . Diabetes Father   . Diabetes Sister     SOCIAL HISTORY: Social History   Socioeconomic History  . Marital status: Widowed    Spouse name: Not on file  . Number of children: 3  . Years of education: Not on file  . Highest education level: 8th grade  Occupational History  . Not on file  Tobacco Use  . Smoking status: Never Smoker  . Smokeless tobacco: Never Used  Substance and Sexual Activity  . Alcohol use: Yes  . Drug use: Never  . Sexual activity: Not Currently  Other Topics Concern  . Not on file  Social History Narrative   Live with [place] (middle)   Has 3 sons   Oldest lives in [place] lives in [place] (with 1 grandchild)   3 cats:  lily-16 , troubles, whiskers (boys-5 years)      Enjoyed sewing, and knitting, but back injury made this worse. Read, tv, putt around, likes astrology      Diet: Eats what she wants. Enjoys veggies and fruits   Caffeine: 1-2 cups of coffee, iced tea   Water: 6-8 cups daily       Wear seatbelt   Does not wear sunscreen   Smoke and carbon monoxide detectors         Social Determinants of Health   Financial Resource Strain: Low Risk   . Difficulty of Paying Living Expenses: Not hard at all  Food Insecurity: No Food Insecurity  . Worried About Charity fundraiser in the Last Year: Never true  . Ran Out of Food in the Last Year: Never true  Transportation Needs: No Transportation Needs  . Lack of Transportation (Medical): No  . Lack of Transportation (Non-Medical): No  Physical Activity: Inactive  . Days of Exercise per Week: 0 days  . Minutes of Exercise per Session: 0 min  Stress: No Stress Concern Present  . Feeling of Stress : Only a little  Social Connections: Socially Isolated  . Frequency of Communication with Friends and Family: More than three times a week  . Frequency of Social Gatherings with Friends and Family: More than three  times a week  . Attends Religious Services: Never  . Active Member of Clubs or Organizations: No  . Attends Archivist Meetings: Never  . Marital Status: Widowed  Intimate Partner Violence: Not At Risk  . Fear of Current or Ex-Partner: No  . Emotionally Abused: No  . Physically Abused: No  . Sexually Abused: No    Allergies  Allergen Reactions  . Influenza Vaccines Swelling    Current Outpatient Medications  Medication Sig Dispense Refill  . busPIRone (BUSPAR) 10 MG tablet Take 10 mg by mouth daily as needed.    . diphenhydrAMINE (BENADRYL ALLERGY) 25 mg capsule Take 25 mg by mouth at bedtime as needed.    . hydrochlorothiazide (HYDRODIURIL) 25 MG tablet Take 1 tablet (25 mg total) by mouth daily. 90 tablet 1  . ibuprofen  (ADVIL) 800 MG tablet TAKE 1 TABLET(800 MG) BY MOUTH DAILY AS NEEDED 30 tablet 0  . lisinopril (ZESTRIL) 40 MG tablet Take 1 tablet (40 mg total) by mouth daily. 90 tablet 1  . thyroid (NP THYROID) 30 MG tablet Take 1 tablet (30 mg total) by mouth daily before breakfast. 30 tablet 3  . mupirocin cream (BACTROBAN) 2 % Apply 1 application topically 2 (two) times daily. (Patient not taking: Reported on 07/24/2019) 15 g 0   No current facility-administered medications for this visit.    REVIEW OF SYSTEMS:  [X]  denotes positive finding, [ ]  denotes negative finding Cardiac  Comments:  Chest pain or chest pressure: x   Shortness of breath upon exertion:    Short of breath when lying flat:    Irregular heart rhythm:        Vascular    Pain in calf, thigh, or hip brought on by ambulation: x   Pain in feet at night that wakes you up from your sleep:     Blood clot in your veins:    Leg swelling:  x       Pulmonary    Oxygen at home:    Productive cough:     Wheezing:         Neurologic    Sudden weakness in arms or legs:  x   Sudden numbness in arms or legs:  x   Sudden onset of difficulty speaking or slurred speech:    Temporary loss of vision in one eye:     Problems with dizziness:         Gastrointestinal    Blood in stool:     Vomited blood:         Genitourinary    Burning when urinating:     Blood in urine:        Psychiatric    Major depression:         Hematologic    Bleeding problems:    Problems with blood clotting too easily:        Skin    Rashes or ulcers: x       Constitutional    Fever or chills:      PHYSICAL EXAM: Vitals:   07/24/19 0915  BP: (!) 147/77  Pulse: 76  Resp: 20  Temp: 97.6 F (36.4 C)  TempSrc: Temporal  SpO2: 98%  Weight: 170 lb 14.4 oz (77.5 kg)  Height: 4\' 11"  (1.499 m)    GENERAL: The patient is a well-nourished female, in no acute distress. The vital signs are documented above. CARDIOVASCULAR: Carotid arteries are  without bruits bilaterally.  2+ radial and  2+ dorsalis pedis pulses bilaterally PULMONARY: There is good air exchange  ABDOMEN: Soft and non-tender  MUSCULOSKELETAL: There are no major deformities or cyanosis. NEUROLOGIC: No focal weakness or paresthesias are detected. SKIN: There are no ulcers or rashes noted. PSYCHIATRIC: The patient has a normal affect.  DATA:  Lower extremity noninvasive studies were obtained today.  These were also compared to noninvasive studies she had in November 2020.  She has completely normal arterial pressures and waveforms in both lower extremities.  Due to her episode of expressive aphasia I suggested that we obtain a carotid duplex to rule out carotid disease as the result of this.  Fortunately her carotid duplex was normal.  MEDICAL ISSUES: Had a long discussion with the patient regarding her lower extremities.  I do not see any evidence of arterial insufficiency as a cause of this.  I reassured her that this is not limb threatening.  She is obviously concerned that her lessening ability to be able to walk.  Explained that walking program would be good for her.  Explained that she is not causing any harm when she is walking and having some discomfort.  She will see [place] again on an as-needed basis   Rosetta Posner, MD [facility]ists of [facility] Tel [phone] Pager [phone]

## 2019-08-08 ENCOUNTER — Telehealth (INDEPENDENT_AMBULATORY_CARE_PROVIDER_SITE_OTHER): Payer: Self-pay

## 2019-08-09 ENCOUNTER — Ambulatory Visit (INDEPENDENT_AMBULATORY_CARE_PROVIDER_SITE_OTHER): Payer: Medicare HMO | Admitting: Internal Medicine

## 2019-08-09 ENCOUNTER — Other Ambulatory Visit: Payer: Self-pay

## 2019-08-09 ENCOUNTER — Encounter (INDEPENDENT_AMBULATORY_CARE_PROVIDER_SITE_OTHER): Payer: Self-pay | Admitting: Internal Medicine

## 2019-08-09 VITALS — BP 160/80 | HR 85 | Temp 97.2°F | Ht 59.0 in | Wt 173.0 lb

## 2019-08-09 DIAGNOSIS — R5383 Other fatigue: Secondary | ICD-10-CM

## 2019-08-09 DIAGNOSIS — E039 Hypothyroidism, unspecified: Secondary | ICD-10-CM

## 2019-08-09 DIAGNOSIS — L719 Rosacea, unspecified: Secondary | ICD-10-CM

## 2019-08-09 DIAGNOSIS — E559 Vitamin D deficiency, unspecified: Secondary | ICD-10-CM | POA: Diagnosis not present

## 2019-08-09 DIAGNOSIS — G8929 Other chronic pain: Secondary | ICD-10-CM

## 2019-08-09 DIAGNOSIS — M545 Low back pain: Secondary | ICD-10-CM | POA: Diagnosis not present

## 2019-08-09 DIAGNOSIS — E669 Obesity, unspecified: Secondary | ICD-10-CM

## 2019-08-09 MED ORDER — IBUPROFEN 800 MG PO TABS: ORAL_TABLET | ORAL | 0 refills | Status: DC

## 2019-08-09 NOTE — Progress Notes (Signed)
Metrics: Intervention Frequency ACO  Documented Smoking Status Yearly  Screened one or more times in 24 months  Cessation Counseling or  Active cessation medication Past 24 months  Past 24 months   Guideline developer: UpToDate (See UpToDate for funding source) Date Released: 2014       Wellness Office Visit  Subjective:  Patient ID: Nancy Schroeder, female    DOB: 04-14-1933  Age: 86 y.o. MRN: [id number]  CC: This lady comes in for follow-up of hypothyroidism, obesity. HPI Today she is complaining of low back pain radiating to her legs, especially the right side.  She has had this pain before.  She did have MRI scan of her lumbar spine in [place] in 2019 which shows degenerative joint disease.  I did refer to neurosurgery and she was called and declined the appointment.  I have urged her to reschedule. She also tells me she has a previous history of vitamin D deficiency. She does feel better since starting NP thyroid for hypothyroidism.  She feels that she has more energy and is able to wake up in the morning and get going. She is also complaining of a rash on her face.  She has had this for several months.  Previously she has seen a dermatologist who told her this was rosacea.  Past Medical History:  Diagnosis Date  . Anxiety   . Depression   . H/O back injury 2016  . Hypertension    Past Surgical History:  Procedure Laterality Date  . ABDOMINAL HYSTERECTOMY    . CHOLECYSTECTOMY    . NECK SURGERY       Family History  Problem Relation Age of Onset  . Diabetes Mother   . Diabetes Father   . Diabetes Sister     Social History   Social History Narrative   Live with Insurance underwriter (middle)   Has 3 sons   Oldest lives in [place] lives in [place] (with 1 grandchild)   3 cats: lily-16 , troubles, whiskers (boys-5 years)      Enjoyed sewing, and knitting, but back injury made this worse. Read, tv, putt around, likes astrology      Diet: Eats what  she wants. Enjoys veggies and fruits   Caffeine: 1-2 cups of coffee, iced tea   Water: 6-8 cups daily       Wear seatbelt   Does not wear sunscreen   Smoke and carbon monoxide detectors         Social History   Tobacco Use  . Smoking status: Never Smoker  . Smokeless tobacco: Never Used  Substance Use Topics  . Alcohol use: Yes    Current Meds  Medication Sig  . busPIRone (BUSPAR) 10 MG tablet Take 10 mg by mouth daily as needed.  . diphenhydrAMINE (BENADRYL ALLERGY) 25 mg capsule Take 25 mg by mouth at bedtime as needed.  . hydrochlorothiazide (HYDRODIURIL) 25 MG tablet Take 1 tablet (25 mg total) by mouth daily.  Marland Kitchen ibuprofen (ADVIL) 800 MG tablet TAKE 1 TABLET(800 MG) BY MOUTH DAILY AS NEEDED  . lisinopril (ZESTRIL) 40 MG tablet Take 1 tablet (40 mg total) by mouth daily.  . mupirocin cream (BACTROBAN) 2 % Apply 1 application topically 2 (two) times daily.  Marland Kitchen thyroid (NP THYROID) 30 MG tablet Take 1 tablet (30 mg total) by mouth daily before breakfast.  . [DISCONTINUED] ibuprofen (ADVIL) 800 MG tablet TAKE 1 TABLET(800 MG) BY MOUTH DAILY AS NEEDED  Depression screen [facility] 2/9 04/19/2019 02/06/2019 11/01/2018 08/02/2018  Decreased Interest 0 0 0 0  Down, Depressed, Hopeless 0 1 2 1   PHQ - 2 Score 0 1 2 1   Altered sleeping - - 0 -  Tired, decreased energy - - 3 -  Change in appetite - - 3 -  Feeling bad or failure about yourself  - - 3 -  Trouble concentrating - - 1 -  Moving slowly or fidgety/restless - - 0 -  Suicidal thoughts - - 0 -  PHQ-9 Score - - 12 -     Objective:   Today's Vitals: BP (!) 160/80 (BP Location: Left Arm, Patient Position: Sitting, Cuff Size: Normal)   Pulse 85   Temp (!) 97.2 F (36.2 C) (Temporal)   Ht 4\' 11"  (1.499 m)   Wt 173 lb (78.5 kg)   SpO2 99%   BMI 34.94 kg/m  Vitals with BMI 08/09/2019 07/24/2019 07/11/2019  Height 4\' 11"  4\' 11"  5\' 0"   Weight 173 lbs 170 lbs 14 oz 173 lbs  BMI 34.92 56.9 79.48  Systolic 016 553 748  Diastolic  80 77 90  Pulse 85 76 -     Physical Exam   She looks systemically well.  She remains obese.  Blood pressure is elevated today.  She is in pain.  The rash on her face appears to confirm rosacea.   Assessment   1. Hypothyroidism, adult   2. Chronic bilateral low back pain without sciatica   3. Obesity (BMI 30.0-34.9)   4. Rosacea   5. Fatigue, unspecified type   6. Vitamin D deficiency disease       Tests ordered Orders Placed This Encounter  Procedures  . T3, free  . TSH  . COMPLETE METABOLIC PANEL WITH GFR  . VITAMIN D 25 Hydroxy (Vit-D Deficiency, Fractures)  . Ambulatory referral to Dermatology     Plan: 1. For hypothyroidism, she will continue with current dose of NP thyroid I would likely need to increase it depending on her levels of T3. 2. For her chronic back pain, we will give her dexamethasone 4 mg intramuscular in the office today as a temporary relief but I urged her to go and see the neurosurgeon and reschedule the appointment. 3. For the rosacea, I will send her to dermatology for further evaluation and treatment. 4. I will check her vitamin D levels to see if we need to optimize this further. 5. Further recommendations will depend on blood results and I will have her follow-up with Sarah in about 3 months time.   Meds ordered this encounter  Medications  . ibuprofen (ADVIL) 800 MG tablet    Sig: TAKE 1 TABLET(800 MG) BY MOUTH DAILY AS NEEDED    Dispense:  30 tablet    Refill:  0    Lanyah Spengler Luther Parody, MD

## 2019-08-13 NOTE — Telephone Encounter (Signed)
Ask pt on her last visit aabout the referral. Pt, said she was new to area. And she was nervious on being here to see new dr to work on her back at this time. Not a good time for her . Dure to pandemic she is not sure if she is ready to go in for surgery & new doctor until she feels reassured it is safe.

## 2019-08-14 ENCOUNTER — Other Ambulatory Visit: Payer: Self-pay

## 2019-08-14 ENCOUNTER — Emergency Department (HOSPITAL_COMMUNITY): Payer: Medicare HMO

## 2019-08-14 ENCOUNTER — Encounter (HOSPITAL_COMMUNITY): Payer: Self-pay | Admitting: Emergency Medicine

## 2019-08-14 ENCOUNTER — Emergency Department (HOSPITAL_COMMUNITY)
Admission: EM | Admit: 2019-08-14 | Discharge: 2019-08-14 | Disposition: A | Discharge status: Home / Self Care | Payer: Medicare HMO | Attending: Emergency Medicine | Admitting: Emergency Medicine

## 2019-08-14 DIAGNOSIS — M5416 Radiculopathy, lumbar region: Secondary | ICD-10-CM | POA: Diagnosis not present

## 2019-08-14 DIAGNOSIS — M79605 Pain in left leg: Secondary | ICD-10-CM | POA: Diagnosis not present

## 2019-08-14 DIAGNOSIS — M545 Low back pain: Secondary | ICD-10-CM | POA: Diagnosis not present

## 2019-08-14 DIAGNOSIS — M79662 Pain in left lower leg: Secondary | ICD-10-CM | POA: Diagnosis not present

## 2019-08-14 DIAGNOSIS — I10 Essential (primary) hypertension: Secondary | ICD-10-CM | POA: Diagnosis not present

## 2019-08-14 DIAGNOSIS — M7989 Other specified soft tissue disorders: Secondary | ICD-10-CM | POA: Diagnosis not present

## 2019-08-14 DIAGNOSIS — R2243 Localized swelling, mass and lump, lower limb, bilateral: Secondary | ICD-10-CM | POA: Diagnosis not present

## 2019-08-14 DIAGNOSIS — Z79899 Other long term (current) drug therapy: Secondary | ICD-10-CM | POA: Diagnosis not present

## 2019-08-14 DIAGNOSIS — M5410 Radiculopathy, site unspecified: Secondary | ICD-10-CM

## 2019-08-14 LAB — BASIC METABOLIC PANEL
Anion gap: 9 (ref 5–15)
BUN: 14 mg/dL (ref 8–23)
CO2: 22 mmol/L (ref 22–32)
Calcium: 8.6 mg/dL — ABNORMAL LOW (ref 8.9–10.3)
Chloride: 109 mmol/L (ref 98–111)
Creatinine, Ser: 0.64 mg/dL (ref 0.44–1.00)
GFR calc Af Amer: >60 mL/min (ref >60)
GFR calc non Af Amer: >60 mL/min (ref >60)
Glucose, Bld: 101 mg/dL — ABNORMAL HIGH (ref 70–99)
Potassium: 3.8 mmol/L (ref 3.5–5.1)
Sodium: 140 mmol/L (ref 135–145)

## 2019-08-14 LAB — CBC WITH DIFFERENTIAL/PLATELET
Abs Immature Granulocytes: 0.04 10*3/uL (ref 0.00–0.07)
Basophils Absolute: 0.1 10*3/uL (ref 0.0–0.1)
Basophils Relative: 1 %
Eosinophils Absolute: 0.2 10*3/uL (ref 0.0–0.5)
Eosinophils Relative: 2 %
HCT: 37.8 % (ref 36.0–46.0)
Hemoglobin: 12.5 g/dL (ref 12.0–15.0)
Immature Granulocytes: 1 %
Lymphocytes Relative: 21 %
Lymphs Abs: 1.8 10*3/uL (ref 0.7–4.0)
MCH: 31.3 pg (ref 26.0–34.0)
MCHC: 33.1 g/dL (ref 30.0–36.0)
MCV: 94.7 fL (ref 80.0–100.0)
Monocytes Absolute: 0.7 10*3/uL (ref 0.1–1.0)
Monocytes Relative: 8 %
Neutro Abs: 6 10*3/uL (ref 1.7–7.7)
Neutrophils Relative %: 67 %
Platelets: 259 10*3/uL (ref 150–400)
RBC: 3.99 MIL/uL (ref 3.87–5.11)
RDW: 12.6 % (ref 11.5–15.5)
WBC: 8.8 10*3/uL (ref 4.0–10.5)
nRBC: 0 % (ref 0.0–0.2)

## 2019-08-14 LAB — PROTIME-INR
INR: 1 (ref 0.8–1.2)
Prothrombin Time: 12.3 seconds (ref 11.4–15.2)

## 2019-08-14 MED ORDER — PREDNISONE 10 MG PO TABS
20.0000 mg | ORAL_TABLET | Freq: Two times a day (BID) | ORAL | 0 refills | Status: DC
Start: 2019-08-14 — End: 2019-11-14

## 2019-08-14 MED ORDER — OXYCODONE-ACETAMINOPHEN 5-325 MG PO TABS: 1.0000 | ORAL_TABLET | Freq: Four times a day (QID) | ORAL | 0 refills | Status: DC | PRN

## 2019-08-14 MED ORDER — MORPHINE SULFATE (PF) 4 MG/ML IV SOLN
4.0000 mg | Freq: Once | INTRAVENOUS | Status: AC
  Administered 2019-08-14: 4 mg via INTRAVENOUS
  Filled 2019-08-14: qty 1

## 2019-08-14 MED ORDER — ONDANSETRON HCL 4 MG/2ML IJ SOLN
4.0000 mg | Freq: Once | INTRAMUSCULAR | Status: AC
  Administered 2019-08-14: 4 mg via INTRAVENOUS
  Filled 2019-08-14: qty 2

## 2019-08-14 NOTE — Discharge Instructions (Signed)
Begin taking Prednisone as prescribed.  Take Percocet as prescribed as needed for pain.  Outpatient MRI as scheduled, and follow up with results with your primary doctor.

## 2019-08-14 NOTE — ED Triage Notes (Signed)
Pt C/O left leg pain. Pt states the leg will "get cold and feel like pins and needles in the bottom of my foot." Pedal pulses present at this time.

## 2019-08-14 NOTE — ED Provider Notes (Signed)
[facility] EMERGENCY DEPARTMENT Provider Note   CSN: [id number] Arrival date & time: 08/14/19  1459     History Chief Complaint  Patient presents with   Leg Pain    Nancy Gurevich is a 86 y.o. female.  Patient is an 86 year old female with history of hypertension, anxiety, and prior neck surgery.  Patient tells me she has sustained an injury during a fall several years ago which required neck repair.  She was also advised to consider lumbar surgery, however decided to put this off.  Over the past several months, the pain in her low back has worsened.  She is now experiencing numbness and a "pins-and-needles" sensation to the bottom of her left foot.  She also describes episodes where she feels as though her foot becomes cold and turns brown.  She denies any chest pain or difficulty breathing.  She denies any fevers or chills.  She does describe some bowel incontinence, however this has been a problem ever since her injury 6 years ago.  The history is provided by the patient.  Leg Pain Location:  Leg Leg location:  L lower leg Worsened by:  Bearing weight Ineffective treatments:  None tried Associated symptoms: back pain   Associated symptoms: no fever        Past Medical History:  Diagnosis Date   Anxiety    Depression    H/O back injury 2016   Hypertension     Patient Active Problem List   Diagnosis Date Noted   Rash and nonspecific skin eruption 04/19/2019   Forgetfulness 02/06/2019   Acute midline thoracic back pain 02/06/2019   Obesity (BMI 30.0-34.9) 02/06/2019   Pain due to onychomycosis of toenails of both feet 09/01/2018   HEADACHE 03/09/2007   ANXIETY DEPRESSION 02/20/2007   NEUROPATHY 02/20/2007   Essential hypertension 02/20/2007   LOW BACK PAIN, CHRONIC 02/20/2007   MALAISE AND FATIGUE 02/20/2007    Past Surgical History:  Procedure Laterality Date   ABDOMINAL HYSTERECTOMY     CHOLECYSTECTOMY     NECK SURGERY       OB  History   No obstetric history on file.     Family History  Problem Relation Age of Onset   Diabetes Mother    Diabetes Father    Diabetes Sister     Social History   Tobacco Use   Smoking status: Never Smoker   Smokeless tobacco: Never Used  Substance Use Topics   Alcohol use: Yes   Drug use: Never    Home Medications Prior to Admission medications   Medication Sig Start Date End Date Taking? Authorizing Provider  busPIRone (BUSPAR) 10 MG tablet Take 10 mg by mouth daily as needed.    [provider]  diphenhydrAMINE (BENADRYL ALLERGY) 25 mg capsule Take 25 mg by mouth at bedtime as needed.    [provider]  hydrochlorothiazide (HYDRODIURIL) 25 MG tablet Take 1 tablet (25 mg total) by mouth daily. 06/21/19   Hurshel Party C, MD  ibuprofen (ADVIL) 800 MG tablet TAKE 1 TABLET(800 MG) BY MOUTH DAILY AS NEEDED 08/09/19   Anastasio Champion, Nimish C, MD  lisinopril (ZESTRIL) 40 MG tablet Take 1 tablet (40 mg total) by mouth daily. 06/21/19   Doree Albee, MD  mupirocin cream (BACTROBAN) 2 % Apply 1 application topically 2 (two) times daily. 04/07/19   Wurst, [place], PA-C  thyroid (NP THYROID) 30 MG tablet Take 1 tablet (30 mg total) by mouth daily before breakfast. 05/29/19   Anastasio Champion,  Nimish C, MD  ibuprofen (ADVIL) 800 MG tablet Take 1 tablet (800 mg total) by mouth daily as needed. 05/29/19   Doree Albee, MD    Allergies    Influenza vaccines  Review of Systems   Review of Systems  Constitutional: Negative for fever.  Musculoskeletal: Positive for back pain.  All other systems reviewed and are negative.   Physical Exam Updated Vital Signs BP (!) 236/80    Pulse 78    Temp 98.3 F (36.8 C)    Resp 20    SpO2 100%   Physical Exam Vitals and nursing note reviewed.  Constitutional:      General: She is not in acute distress.    Appearance: She is well-developed. She is not diaphoretic.  HENT:     Head: Normocephalic and atraumatic.   Cardiovascular:     Rate and Rhythm: Normal rate and regular rhythm.     Heart sounds: No murmur heard.  No friction rub. No gallop.   Pulmonary:     Effort: Pulmonary effort is normal. No respiratory distress.     Breath sounds: Normal breath sounds. No wheezing.  Abdominal:     General: Bowel sounds are normal. There is no distension.     Palpations: Abdomen is soft.     Tenderness: There is no abdominal tenderness.  Musculoskeletal:        General: Normal range of motion.     Cervical back: Normal range of motion and neck supple.     Right lower leg: Edema present.     Left lower leg: Edema present.     Comments: There is trace edema of both lower extremities.  DP pulses and PT pulses are palpable and equal in both feet.  Strength is 5 out of 5 in both lower extremities.  She does have some calf tenderness.  Bevelyn Buckles' sign is absent.  Skin:    General: Skin is warm and dry.  Neurological:     Mental Status: She is alert and oriented to person, place, and time.     ED Results / Procedures / Treatments   Labs (all labs ordered are listed, but only abnormal results are displayed) Labs Reviewed  BASIC METABOLIC PANEL  CBC WITH DIFFERENTIAL/PLATELET  PROTIME-INR    EKG None  Radiology No results found.  Procedures Procedures (including critical care time)  Medications Ordered in ED Medications  morphine 4 MG/ML injection 4 mg (has no administration in time range)  ondansetron (ZOFRAN) injection 4 mg (has no administration in time range)    ED Course  I have reviewed the triage vital signs and the nursing notes.  Pertinent labs & imaging results that were available during my care of the patient were reviewed by me and considered in my medical decision making (see chart for details).    MDM Rules/Calculators/A&P  Patient is an 86 year old female with complaints of back and leg pain.  Patient has history of chronic back issues stemming from a fall 6 years ago.  She  states that her symptoms are becoming worse.  Laboratory studies today are unremarkable and ultrasound shows no evidence for DVT.  Patient should undergo MRI to further evaluate the cause of her back pain.  She appears neurovascularly intact today and I see no indication for emergent MRI.  This will be arranged as an outpatient and she is to follow the results up with her primary doctor.  In the meantime she will be treated with a  course of prednisone and pain medicine.  Final Clinical Impression(s) / ED Diagnoses Final diagnoses:  None    Rx / DC Orders ED Discharge Orders    None       Veryl Speak, MD 08/14/19 1821

## 2019-08-17 ENCOUNTER — Encounter: Payer: Self-pay | Admitting: Podiatry

## 2019-08-17 ENCOUNTER — Other Ambulatory Visit: Payer: Self-pay

## 2019-08-17 ENCOUNTER — Ambulatory Visit (INDEPENDENT_AMBULATORY_CARE_PROVIDER_SITE_OTHER): Payer: Medicare HMO | Admitting: Podiatry

## 2019-08-17 VITALS — Temp 96.5°F

## 2019-08-17 DIAGNOSIS — M79675 Pain in left toe(s): Secondary | ICD-10-CM | POA: Diagnosis not present

## 2019-08-17 DIAGNOSIS — M79674 Pain in right toe(s): Secondary | ICD-10-CM

## 2019-08-17 DIAGNOSIS — B351 Tinea unguium: Secondary | ICD-10-CM | POA: Diagnosis not present

## 2019-08-17 DIAGNOSIS — I739 Peripheral vascular disease, unspecified: Secondary | ICD-10-CM

## 2019-08-19 NOTE — Progress Notes (Signed)
Subjective:  Patient ID: Nancy Schroeder, female    DOB: 1933-03-12,  MRN: [id number]  86 y.o. female presents with for at risk foot care. Patient has h/o PAD and painful thick toenails that are difficult to trim. Also has h/o neuropathy. Pain interferes with ambulation. Aggravating factors include wearing enclosed shoe gear. Pain is relieved with periodic professional debridement..    Review of Systems: Negative except as noted in the HPI.  Past Medical History:  Diagnosis Date   Anxiety    Depression    H/O back injury 2016   Hypertension    Past Surgical History:  Procedure Laterality Date   ABDOMINAL HYSTERECTOMY     CHOLECYSTECTOMY     NECK SURGERY     Patient Active Problem List   Diagnosis Date Noted   Rash and nonspecific skin eruption 04/19/2019   Forgetfulness 02/06/2019   Acute midline thoracic back pain 02/06/2019   Obesity (BMI 30.0-34.9) 02/06/2019   Pain due to onychomycosis of toenails of both feet 09/01/2018   HEADACHE 03/09/2007   ANXIETY DEPRESSION 02/20/2007   NEUROPATHY 02/20/2007   Essential hypertension 02/20/2007   LOW BACK PAIN, CHRONIC 02/20/2007   MALAISE AND FATIGUE 02/20/2007    Current Outpatient Medications:    aspirin EC 81 MG tablet, Take 81 mg by mouth daily. Swallow whole., Disp: , Rfl:    busPIRone (BUSPAR) 10 MG tablet, Take 10 mg by mouth daily as needed (for mood). , Disp: , Rfl:    diphenhydrAMINE (BENADRYL ALLERGY) 25 mg capsule, Take 25 mg by mouth at bedtime as needed., Disp: , Rfl:    hydrochlorothiazide (HYDRODIURIL) 25 MG tablet, Take 1 tablet (25 mg total) by mouth daily. (Patient taking differently: Take 25 mg by mouth at bedtime. ), Disp: 90 tablet, Rfl: 1   ibuprofen (ADVIL) 800 MG tablet, TAKE 1 TABLET(800 MG) BY MOUTH DAILY AS NEEDED, Disp: 30 tablet, Rfl: 0   lisinopril (ZESTRIL) 40 MG tablet, Take 1 tablet (40 mg total) by mouth daily., Disp: 90 tablet, Rfl: 1   oxyCODONE-acetaminophen  (PERCOCET) 5-325 MG tablet, Take 1-2 tablets by mouth every 6 (six) hours as needed., Disp: 15 tablet, Rfl: 0   predniSONE (DELTASONE) 10 MG tablet, Take 2 tablets (20 mg total) by mouth 2 (two) times daily., Disp: 20 tablet, Rfl: 0   thyroid (NP THYROID) 30 MG tablet, Take 1 tablet (30 mg total) by mouth daily before breakfast., Disp: 30 tablet, Rfl: 3   mupirocin cream (BACTROBAN) 2 %, Apply 1 application topically 2 (two) times daily. (Patient not taking: Reported on 08/17/2019), Disp: 15 g, Rfl: 0 Allergies  Allergen Reactions   Influenza Vaccines Swelling   Social History   Occupational History   Not on file  Tobacco Use   Smoking status: Never Smoker   Smokeless tobacco: Never Used  Substance and Sexual Activity   Alcohol use: Yes   Drug use: Never   Sexual activity: Not Currently    Objective:   Constitutional Pt is a pleasant 86 y.o. Caucasian female WD, WN in NAD.Marland Kitchen AAO x 3.   Vascular Capillary fill time to digits delayed b/l lower extremities. Nonpalpable DP pulse(s) b/l lower extremities. Nonpalpable PT pulse(s) b/l lower extremities. Pedal hair absent. Lower extremity skin temperature gradient within normal limits. No pain with calf compression b/l. No edema noted b/l lower extremities. No ischemia or gangrene noted b/l lower extremities. No cyanosis or clubbing noted.  Neurologic Normal speech. Oriented to person, place, and time. Protective sensation intact 5/5  intact bilaterally with 10g monofilament b/l. Vibratory sensation intact b/l. Proprioception intact bilaterally. Clonus negative b/l.  Dermatologic Pedal skin with normal turgor, texture and tone bilaterally. No open wounds bilaterally. No interdigital macerations bilaterally. Toenails 1-5 b/l elongated, discolored, dystrophic, thickened, crumbly with subungual debris and tenderness to dorsal palpation. No hyperkeratotic nor porokeratotic lesions present on today's visit.  Orthopedic: Normal muscle strength  5/5 to all lower extremity muscle groups bilaterally. No pain crepitus or joint limitation noted with ROM b/l. Hallux valgus with bunion deformity noted b/l lower extremities. Hammertoes noted to the 2-5 bilaterally.   Radiographs: None Assessment:   1. Pain due to onychomycosis of toenails of both feet   2. Peripheral vascular disease ([place])    Plan:  Patient was evaluated and treated and all questions answered.  Onychomycosis with pain -Nails palliatively debridement as below. -Educated on self-care  Procedure: Nail Debridement Rationale: Pain Type of Debridement: manual, sharp debridement. Instrumentation: Nail nipper, rotary burr. Number of Nails: 10  -No new findings. No new orders. -Continue diabetic foot care principles. -Toenails 1-5 b/l were debrided in length and girth with sterile nail nippers and dremel without iatrogenic bleeding.  -Patient to report any pedal injuries to medical professional immediately. -Patient to continue soft, supportive shoe gear daily. -Patient/POA to call should there be question/concern in the interim.  Return in about 9 weeks (around 10/19/2019).  Marzetta Board, DPM

## 2019-08-20 DIAGNOSIS — R69 Illness, unspecified: Secondary | ICD-10-CM | POA: Diagnosis not present

## 2019-08-24 ENCOUNTER — Other Ambulatory Visit (INDEPENDENT_AMBULATORY_CARE_PROVIDER_SITE_OTHER): Payer: Self-pay | Admitting: Internal Medicine

## 2019-09-07 ENCOUNTER — Ambulatory Visit: Payer: Medicare Other | Admitting: Podiatry

## 2019-09-28 ENCOUNTER — Other Ambulatory Visit (INDEPENDENT_AMBULATORY_CARE_PROVIDER_SITE_OTHER): Payer: Self-pay | Admitting: Nurse Practitioner

## 2019-10-02 DIAGNOSIS — L719 Rosacea, unspecified: Secondary | ICD-10-CM | POA: Diagnosis not present

## 2019-10-27 ENCOUNTER — Other Ambulatory Visit (INDEPENDENT_AMBULATORY_CARE_PROVIDER_SITE_OTHER): Payer: Self-pay | Admitting: Internal Medicine

## 2019-10-31 DIAGNOSIS — L719 Rosacea, unspecified: Secondary | ICD-10-CM | POA: Diagnosis not present

## 2019-11-13 ENCOUNTER — Other Ambulatory Visit (INDEPENDENT_AMBULATORY_CARE_PROVIDER_SITE_OTHER): Payer: Self-pay | Admitting: Internal Medicine

## 2019-11-14 ENCOUNTER — Other Ambulatory Visit: Payer: Self-pay

## 2019-11-14 ENCOUNTER — Telehealth (INDEPENDENT_AMBULATORY_CARE_PROVIDER_SITE_OTHER): Payer: Self-pay

## 2019-11-14 ENCOUNTER — Ambulatory Visit (INDEPENDENT_AMBULATORY_CARE_PROVIDER_SITE_OTHER): Payer: Medicare HMO | Admitting: Nurse Practitioner

## 2019-11-14 ENCOUNTER — Encounter (INDEPENDENT_AMBULATORY_CARE_PROVIDER_SITE_OTHER): Payer: Self-pay | Admitting: Nurse Practitioner

## 2019-11-14 VITALS — BP 148/84 | HR 80 | Temp 96.9°F | Ht 59.0 in | Wt 171.4 lb

## 2019-11-14 DIAGNOSIS — R152 Fecal urgency: Secondary | ICD-10-CM | POA: Diagnosis not present

## 2019-11-14 DIAGNOSIS — Z23 Encounter for immunization: Secondary | ICD-10-CM

## 2019-11-14 DIAGNOSIS — F341 Dysthymic disorder: Secondary | ICD-10-CM

## 2019-11-14 DIAGNOSIS — I10 Essential (primary) hypertension: Secondary | ICD-10-CM | POA: Diagnosis not present

## 2019-11-14 DIAGNOSIS — E559 Vitamin D deficiency, unspecified: Secondary | ICD-10-CM

## 2019-11-14 DIAGNOSIS — E039 Hypothyroidism, unspecified: Secondary | ICD-10-CM

## 2019-11-14 DIAGNOSIS — R159 Full incontinence of feces: Secondary | ICD-10-CM | POA: Diagnosis not present

## 2019-11-14 MED ORDER — BUSPIRONE HCL 10 MG PO TABS: 10.0000 mg | ORAL_TABLET | Freq: Every day | ORAL | 1 refills | Status: DC | PRN

## 2019-11-14 NOTE — Patient Instructions (Signed)
Metamucil over the counter: 2 capsules by mouth three times a day.

## 2019-11-14 NOTE — Telephone Encounter (Signed)
Patient called and left a voice message and stated that she discussed today at her appointment that she needed to have a different dosage of her thyroid medication and she noticed that the new dosage has not been sent yet.  I see that her thyroid medication was sent yesterday and not sure if this is the correct dosage or if new prescription with dose change needs to be sent?

## 2019-11-14 NOTE — Telephone Encounter (Signed)
Upon reviewing patient's chart to properly answer the patient's question, it was noted that she has influenza vaccine listed in her allergies.  I did discuss this with Jinny Blossom as she administered the high-dose flu shot for this patient today.  Jinny Blossom does tell me she did go through the pre-injection questions with the patient and the patient denied any history of severe allergic reaction to flu-vaccine, eggs, and or latex.  I asked Jinny Blossom to call the patient back this morning, and both myself and Jinny Blossom talk to the patient. The patient denies experiencing any swelling of the lips, tongue, throat, or rash/difficulty breathing since receiving the flu vaccine earlier today. Upon further questioning the patient tells me that approximately 60 years ago she did have some nonspecific swelling to her body after receiving the flu vaccine.  She tells me that she did not ever have any respiratory involvement and denies any history of lip, tongue, throat swelling or difficulties with breathing.  She tells me she has had a flu shot since that initial reaction and did not experience a similar type reaction.  She was educated that if she experiences any lip swelling, tongue swelling, throat swelling, shortness of breath, or significant hives over the next 48 hours that she needs to call 911.  The patient lives with her son, with the patient's permission I also discussed this with the son. He has been notified of this and he is also aware of which signs to look out for and when to call 911.  As for the question the patient had about her thyroid medicine dose, I reminded her that we will wait for her blood work results before determining dosage changes to her thyroid.  She tells me she remembers this conversation that we had in the office today, and will await her call tomorrow.

## 2019-11-14 NOTE — Progress Notes (Signed)
Subjective:  Patient ID: Nancy Schroeder, female    DOB: 10-08-1933  Age: 86 y.o. MRN: [id number]  CC:  Chief Complaint  Patient presents with  . Follow-up    has a cracked tooth and tongue is sore and cut  . Hypothyroidism  . Hypertension  . Other    Stool leakage, vitamin D deficiency  . Depression      HPI  This patient arrives today for the above.  Hypothyroidism: She continues on NP thyroid 30 mg daily.  Per chart review she was scheduled to have blood work collected approximately 3 months ago to monitor her thyroid levels, however this was not completed.  She will be due for that today.  She is tolerating medication well but continues to feel fatigued and continued hair loss.    Hypertension: She continues on her antihypertensives and is tolerating these well.  Stool leakage: She mentions to me she does have loose stools and will sometimes experience some anal leakage.  She tells me is been going on for quite some time, and is very distressing to her.  She sometimes has incontinence of her stool because she has difficult time ambulating to the bathroom before she has a bowel movement.  This is most likely related to her chronic back pain.  Depression/anxiety: She also mentions to me she has been feeling a bit depressed.  She believes this is mostly related to her reduced energy levels and not being able to complete the task that she would like to do.  She has been on BuSpar in the past as needed, she tells me she would like a refill on this today because she is out of it.  She does not want to consider taking SSRI or SNRI at this time.  Vitamin D deficiency: She also has a history of vitamin D deficiency.  She is not currently on any vitamin D3 supplement.  Of note, she tells me that she has a tooth that is cracked and she is scheduled to see her dentist later on this week for evaluation and treatment of this.  Past Medical History:  Diagnosis Date  . Anxiety   .  Depression   . H/O back injury 2016  . Hypertension       Family History  Problem Relation Age of Onset  . Diabetes Mother   . Diabetes Father   . Diabetes Sister     Social History   Social History Narrative   Live with Insurance underwriter (middle)   Has 3 sons   Oldest lives in [place] lives in [place] (with 1 grandchild)   3 cats: lily-16 , troubles, whiskers (boys-5 years)      Enjoyed sewing, and knitting, but back injury made this worse. Read, tv, putt around, likes astrology      Diet: Eats what she wants. Enjoys veggies and fruits   Caffeine: 1-2 cups of coffee, iced tea   Water: 6-8 cups daily       Wear seatbelt   Does not wear sunscreen   Smoke and carbon monoxide detectors         Social History   Tobacco Use  . Smoking status: Never Smoker  . Smokeless tobacco: Never Used  Substance Use Topics  . Alcohol use: Yes     Current Meds  Medication Sig  . aspirin EC 81 MG tablet Take 81 mg by mouth daily. Swallow whole.  . busPIRone (BUSPAR) 10 MG tablet Take 1  tablet (10 mg total) by mouth daily as needed (for mood).  . diphenhydrAMINE (BENADRYL ALLERGY) 25 mg capsule Take 25 mg by mouth at bedtime as needed.  . doxycycline (VIBRAMYCIN) 100 MG capsule   . hydrochlorothiazide (HYDRODIURIL) 25 MG tablet Take 1 tablet (25 mg total) by mouth daily. (Patient taking differently: Take 25 mg by mouth at bedtime. )  . ibuprofen (ADVIL) 800 MG tablet TAKE 1 TABLET(800 MG) BY MOUTH DAILY AS NEEDED  . lisinopril (ZESTRIL) 40 MG tablet Take 1 tablet (40 mg total) by mouth daily.  . NP THYROID 30 MG tablet TAKE 1 TABLET(30 MG) BY MOUTH DAILY BEFORE BREAKFAST  . Psyllium (METAMUCIL FIBER PO) Take 2 capsules by mouth 3 (three) times daily.  . [DISCONTINUED] busPIRone (BUSPAR) 10 MG tablet Take 10 mg by mouth daily as needed (for mood).     ROS:  Review of Systems  Constitutional: Positive for malaise/fatigue.  Eyes: Negative.   Respiratory: Negative.     Cardiovascular: Negative.   Gastrointestinal: Positive for diarrhea.  Musculoskeletal: Positive for back pain.  Neurological: Negative.   Psychiatric/Behavioral: Positive for depression.     Objective:   Today's Vitals: BP (!) 148/84   Pulse 80   Temp (!) 96.9 F (36.1 C) (Temporal)   Ht '4\' 11"'  (1.499 m)   Wt 171 lb 6.4 oz (77.7 kg)   SpO2 94%   BMI 34.62 kg/m  Vitals with BMI 11/14/2019 08/14/2019 08/09/2019  Height '4\' 11"'  - '4\' 11"'   Weight 171 lbs 6 oz - 173 lbs  BMI 20.2 - 54.27  Systolic 062 376 283  Diastolic 84 80 80  Pulse 80 78 85     Physical Exam Vitals reviewed.  Constitutional:      General: She is not in acute distress.    Appearance: Normal appearance.  HENT:     Head: Normocephalic and atraumatic.  Neck:     Vascular: No carotid bruit.  Cardiovascular:     Rate and Rhythm: Normal rate and regular rhythm.     Pulses: Normal pulses.     Heart sounds: Normal heart sounds.  Pulmonary:     Effort: Pulmonary effort is normal.     Breath sounds: Normal breath sounds.  Skin:    General: Skin is warm and dry.  Neurological:     General: No focal deficit present.     Mental Status: She is alert and oriented to person, place, and time.  Psychiatric:        Mood and Affect: Mood normal.        Behavior: Behavior normal.        Judgment: Judgment normal.          Assessment and Plan   1. Essential hypertension   2. Hypothyroidism, adult   3. Vitamin D deficiency disease   4. ANXIETY DEPRESSION   5. Need for influenza vaccination   6. Incontinence of feces with fecal urgency      Plan: 1.  Blood pressure is a bit above goal today, but I believe this is related to the pain associated with her tooth.  We will continue on current antihypertensives as prescribed. 2.  We will check thyroid panel today for further evaluation.  For now she will continue on her current dose of NP thyroid. 3.  We will check serum vitamin D level today for further  evaluation. 4.  We will refill BuSpar that she can take 3 times a day as needed for anxiety. 5.  We will administer high-dose flu shot today. 6.  Recommended that she try over-the-counter Metamucil 3 times a day to see if this will bulk her stool and result in less leakage.   Tests ordered Orders Placed This Encounter  Procedures  . Flu Vaccine QUAD High Dose(Fluad)  . TSH  . T3, Free  . T4, Free  . Vitamin D, 25-hydroxy  . CMP with eGFR([facility])      Meds ordered this encounter  Medications  . busPIRone (BUSPAR) 10 MG tablet    Sig: Take 1 tablet (10 mg total) by mouth daily as needed (for mood).    Dispense:  180 tablet    Refill:  1    Order Specific Question:   Supervising Provider    Answer:   Doree Albee [[id number]]    Patient to follow-up in 3 months or sooner as needed.  Ailene Ards, NP

## 2019-11-15 LAB — COMPLETE METABOLIC PANEL WITH GFR
AG Ratio: 1.5 (calc) (ref 1.0–2.5)
ALT: 21 U/L (ref 6–29)
AST: 21 U/L (ref 10–35)
Albumin: 3.8 g/dL (ref 3.6–5.1)
Alkaline phosphatase (APISO): 66 U/L (ref 37–153)
BUN: 13 mg/dL (ref 7–25)
CO2: 26 mmol/L (ref 20–32)
Calcium: 9.5 mg/dL (ref 8.6–10.4)
Chloride: 109 mmol/L (ref 98–110)
Creat: 0.71 mg/dL (ref 0.60–0.88)
GFR, Est African American: 91 mL/min/{1.73_m2} (ref >=60)
GFR, Est Non African American: 78 mL/min/{1.73_m2} (ref >=60)
Globulin: 2.5 g/dL (calc) (ref 1.9–3.7)
Glucose, Bld: 105 mg/dL — ABNORMAL HIGH (ref 65–99)
Potassium: 3.9 mmol/L (ref 3.5–5.3)
Sodium: 144 mmol/L (ref 135–146)
Total Bilirubin: 0.6 mg/dL (ref 0.2–1.2)
Total Protein: 6.3 g/dL (ref 6.1–8.1)

## 2019-11-15 LAB — TSH: TSH: 2.79 mIU/L (ref 0.40–4.50)

## 2019-11-15 LAB — VITAMIN D 25 HYDROXY (VIT D DEFICIENCY, FRACTURES): Vit D, 25-Hydroxy: 23 ng/mL — ABNORMAL LOW (ref 30–100)

## 2019-11-15 LAB — T3, FREE: T3, Free: 4.6 pg/mL — ABNORMAL HIGH (ref 2.3–4.2)

## 2019-11-15 LAB — T4, FREE: Free T4: 1.2 ng/dL (ref 0.8–1.8)

## 2019-11-16 ENCOUNTER — Ambulatory Visit: Payer: Medicare HMO | Admitting: Podiatry

## 2019-11-19 ENCOUNTER — Ambulatory Visit (INDEPENDENT_AMBULATORY_CARE_PROVIDER_SITE_OTHER): Payer: Medicare HMO | Admitting: Podiatry

## 2019-11-19 ENCOUNTER — Encounter: Payer: Self-pay | Admitting: Podiatry

## 2019-11-19 ENCOUNTER — Other Ambulatory Visit: Payer: Self-pay

## 2019-11-19 DIAGNOSIS — M79672 Pain in left foot: Secondary | ICD-10-CM | POA: Diagnosis not present

## 2019-11-19 DIAGNOSIS — G8929 Other chronic pain: Secondary | ICD-10-CM

## 2019-11-19 DIAGNOSIS — M79674 Pain in right toe(s): Secondary | ICD-10-CM

## 2019-11-19 DIAGNOSIS — M79675 Pain in left toe(s): Secondary | ICD-10-CM | POA: Diagnosis not present

## 2019-11-19 DIAGNOSIS — I739 Peripheral vascular disease, unspecified: Secondary | ICD-10-CM | POA: Diagnosis not present

## 2019-11-19 DIAGNOSIS — B351 Tinea unguium: Secondary | ICD-10-CM | POA: Diagnosis not present

## 2019-11-19 NOTE — Patient Instructions (Signed)
Purchase Voltaren Gel 1% and apply 2 grams to painful left foot twice daily.

## 2019-11-25 NOTE — Progress Notes (Signed)
Subjective:  Patient ID: Nancy Schroeder, female    DOB: 11-30-1933,  MRN: [id number]  86 y.o. female presents with for at risk foot care. Patient has h/o PAD and painful thick toenails that are difficult to trim. Also has h/o neuropathy. Pain interferes with ambulation. Aggravating factors include wearing enclosed shoe gear. Pain is relieved with periodic professional debridement.  Patient states she is having left foot pain. Patient had what is described as plantar fasciotomy in the 1990's and states she was told her nerve was cut. She describes pain 1st step in the morning and after periods of rest. Also has pain with weightbearing. States she was seen in ED for this and was treated for lumbar radiculopathy.  Review of Systems: Negative except as noted in the HPI.  Past Medical History:  Diagnosis Date  . Anxiety   . Depression   . H/O back injury 2016  . Hypertension    Past Surgical History:  Procedure Laterality Date  . ABDOMINAL HYSTERECTOMY    . CHOLECYSTECTOMY    . NECK SURGERY     Patient Active Problem List   Diagnosis Date Noted  . Rash and nonspecific skin eruption 04/19/2019  . Forgetfulness 02/06/2019  . Acute midline thoracic back pain 02/06/2019  . Obesity (BMI 30.0-34.9) 02/06/2019  . Pain due to onychomycosis of toenails of both feet 09/01/2018  . HEADACHE 03/09/2007  . ANXIETY DEPRESSION 02/20/2007  . NEUROPATHY 02/20/2007  . Essential hypertension 02/20/2007  . LOW BACK PAIN, CHRONIC 02/20/2007  . MALAISE AND FATIGUE 02/20/2007    Current Outpatient Medications:  .  aspirin EC 81 MG tablet, Take 81 mg by mouth daily. Swallow whole., Disp: , Rfl:  .  busPIRone (BUSPAR) 10 MG tablet, Take 1 tablet (10 mg total) by mouth daily as needed (for mood)., Disp: 180 tablet, Rfl: 1 .  diphenhydrAMINE (BENADRYL ALLERGY) 25 mg capsule, Take 25 mg by mouth at bedtime as needed., Disp: , Rfl:  .  doxycycline (VIBRAMYCIN) 100 MG capsule, , Disp: , Rfl:  .   hydrochlorothiazide (HYDRODIURIL) 25 MG tablet, Take 1 tablet (25 mg total) by mouth daily. (Patient taking differently: Take 25 mg by mouth at bedtime. ), Disp: 90 tablet, Rfl: 1 .  ibuprofen (ADVIL) 800 MG tablet, TAKE 1 TABLET(800 MG) BY MOUTH DAILY AS NEEDED, Disp: 30 tablet, Rfl: 0 .  lisinopril (ZESTRIL) 40 MG tablet, Take 1 tablet (40 mg total) by mouth daily., Disp: 90 tablet, Rfl: 1 .  NP THYROID 30 MG tablet, TAKE 1 TABLET(30 MG) BY MOUTH DAILY BEFORE BREAKFAST, Disp: 30 tablet, Rfl: 3 .  Psyllium (METAMUCIL FIBER PO), Take 2 capsules by mouth 3 (three) times daily., Disp: , Rfl:  Allergies  Allergen Reactions  . Influenza Vaccines Swelling   Social History   Occupational History  . Not on file  Tobacco Use  . Smoking status: Never Smoker  . Smokeless tobacco: Never Used  Vaping Use  . Vaping Use: Never used  Substance and Sexual Activity  . Alcohol use: Yes  . Drug use: Never  . Sexual activity: Not Currently    Objective:   Constitutional Pt is a pleasant 86 y.o. Caucasian female WD, WN in NAD.Marland Kitchen AAO x 3.   Vascular Capillary fill time to digits delayed b/l lower extremities. Nonpalpable DP pulse(s) b/l lower extremities. Nonpalpable PT pulse(s) b/l lower extremities. Pedal hair absent. Lower extremity skin temperature gradient within normal limits. No pain with calf compression b/l. No edema noted b/l lower extremities. No  ischemia or gangrene noted b/l lower extremities. No cyanosis or clubbing noted.  Neurologic Normal speech. Oriented to person, place, and time. Protective sensation intact 5/5 intact bilaterally with 10g monofilament b/l. Vibratory sensation intact b/l. Proprioception intact bilaterally. Clonus negative b/l.  Dermatologic Pedal skin with normal turgor, texture and tone bilaterally. No open wounds bilaterally. No interdigital macerations bilaterally. Toenails 1-5 b/l elongated, discolored, dystrophic, thickened, crumbly with subungual debris and tenderness  to dorsal palpation. No hyperkeratotic nor porokeratotic lesions present on today's visit.  Orthopedic: Normal muscle strength 5/5 to all lower extremity muscle groups bilaterally. No pain crepitus or joint limitation noted with ROM b/l. Hallux valgus with bunion deformity noted b/l lower extremities. Hammertoes noted to the 2-5 bilaterally. Pain noted on palpation medial tubercle left heel. Pain on palpation medial tubercle right heel.   Radiographs: None Assessment:   1. Pain due to onychomycosis of toenails of both feet   2. Chronic pain in left foot   3. Peripheral vascular disease ([place])    Plan:  Patient was evaluated and treated and all questions answered.  Onychomycosis with pain -Nails palliatively debridement as below. -Educated on self-care  Procedure: Nail Debridement Rationale: Pain Type of Debridement: manual, sharp debridement. Instrumentation: Nail nipper, rotary burr. Number of Nails: 10  -No new findings. No new orders. -Continue diabetic foot care principles. -Toenails 1-5 b/l were debrided in length and girth with sterile nail nippers and dremel without iatrogenic bleeding.  -Patient to report any pedal injuries to medical professional immediately. -Discussed plantar fasciitis and treatment options. She is using  Voltaren Gel and it is not working. I dispensed Gel Steps heel cushions, size small. Patient noted immediate relief of syptoms when weightbearing. She was advised she could move these from shoe to shoe. -Some of her pain may be from radiculopathy. She has h/o back injury. MRI was ordered in July, but not done. -Patient to continue soft, supportive shoe gear daily. -Patient/POA to call should there be question/concern in the interim.  Return in about 9 weeks (around 01/21/2020).  Marzetta Board, DPM

## 2019-11-30 ENCOUNTER — Other Ambulatory Visit (INDEPENDENT_AMBULATORY_CARE_PROVIDER_SITE_OTHER): Payer: Self-pay | Admitting: Nurse Practitioner

## 2019-12-10 DIAGNOSIS — H02823 Cysts of right eye, unspecified eyelid: Secondary | ICD-10-CM | POA: Diagnosis not present

## 2019-12-10 DIAGNOSIS — H524 Presbyopia: Secondary | ICD-10-CM | POA: Diagnosis not present

## 2019-12-10 DIAGNOSIS — H5203 Hypermetropia, bilateral: Secondary | ICD-10-CM | POA: Diagnosis not present

## 2019-12-10 DIAGNOSIS — H25813 Combined forms of age-related cataract, bilateral: Secondary | ICD-10-CM | POA: Diagnosis not present

## 2020-01-02 DIAGNOSIS — H25812 Combined forms of age-related cataract, left eye: Secondary | ICD-10-CM | POA: Diagnosis not present

## 2020-01-02 DIAGNOSIS — H2511 Age-related nuclear cataract, right eye: Secondary | ICD-10-CM | POA: Diagnosis not present

## 2020-01-16 ENCOUNTER — Other Ambulatory Visit (INDEPENDENT_AMBULATORY_CARE_PROVIDER_SITE_OTHER): Payer: Self-pay | Admitting: Internal Medicine

## 2020-01-16 DIAGNOSIS — H59021 Cataract (lens) fragments in eye following cataract surgery, right eye: Secondary | ICD-10-CM | POA: Diagnosis not present

## 2020-01-23 ENCOUNTER — Ambulatory Visit: Payer: Medicare HMO | Admitting: Podiatry

## 2020-01-29 ENCOUNTER — Other Ambulatory Visit (INDEPENDENT_AMBULATORY_CARE_PROVIDER_SITE_OTHER): Payer: Self-pay | Admitting: Internal Medicine

## 2020-02-06 DIAGNOSIS — H2512 Age-related nuclear cataract, left eye: Secondary | ICD-10-CM | POA: Diagnosis not present

## 2020-02-06 DIAGNOSIS — H25012 Cortical age-related cataract, left eye: Secondary | ICD-10-CM | POA: Diagnosis not present

## 2020-02-12 ENCOUNTER — Ambulatory Visit (INDEPENDENT_AMBULATORY_CARE_PROVIDER_SITE_OTHER): Payer: Medicare HMO | Admitting: Podiatry

## 2020-02-12 ENCOUNTER — Other Ambulatory Visit: Payer: Self-pay

## 2020-02-12 ENCOUNTER — Encounter: Payer: Self-pay | Admitting: Podiatry

## 2020-02-12 DIAGNOSIS — B351 Tinea unguium: Secondary | ICD-10-CM

## 2020-02-12 DIAGNOSIS — I739 Peripheral vascular disease, unspecified: Secondary | ICD-10-CM | POA: Diagnosis not present

## 2020-02-12 DIAGNOSIS — M79675 Pain in left toe(s): Secondary | ICD-10-CM | POA: Diagnosis not present

## 2020-02-12 DIAGNOSIS — M79674 Pain in right toe(s): Secondary | ICD-10-CM | POA: Diagnosis not present

## 2020-02-14 ENCOUNTER — Ambulatory Visit (INDEPENDENT_AMBULATORY_CARE_PROVIDER_SITE_OTHER): Payer: Medicare HMO | Admitting: Internal Medicine

## 2020-02-16 NOTE — Progress Notes (Signed)
Subjective:  Patient ID: Nancy Schroeder, female    DOB: 1933-03-16,  MRN: [id number]  87 y.o. female presents with for at risk foot care. Patient has h/o PAD and painful thick toenails that are difficult to trim. Also has h/o neuropathy. Pain interferes with ambulation. Aggravating factors include wearing enclosed shoe gear. Pain is relieved with periodic professional debridement.  She states she is tired due to her thyroid issues.  Review of Systems: Negative except as noted in the HPI.  Past Medical History:  Diagnosis Date  . Anxiety   . Depression   . H/O back injury 2016  . Hypertension    Past Surgical History:  Procedure Laterality Date  . ABDOMINAL HYSTERECTOMY    . CHOLECYSTECTOMY    . NECK SURGERY     Patient Active Problem List   Diagnosis Date Noted  . Rash and nonspecific skin eruption 04/19/2019  . Forgetfulness 02/06/2019  . Acute midline thoracic back pain 02/06/2019  . Obesity (BMI 30.0-34.9) 02/06/2019  . Pain due to onychomycosis of toenails of both feet 09/01/2018  . HEADACHE 03/09/2007  . ANXIETY DEPRESSION 02/20/2007  . NEUROPATHY 02/20/2007  . Essential hypertension 02/20/2007  . LOW BACK PAIN, CHRONIC 02/20/2007  . MALAISE AND FATIGUE 02/20/2007    Current Outpatient Medications:  .  doxycycline (VIBRAMYCIN) 100 MG capsule, Take 1 capsule by mouth daily., Disp: , Rfl:  .  aspirin EC 81 MG tablet, Take 81 mg by mouth daily. Swallow whole., Disp: , Rfl:  .  busPIRone (BUSPAR) 10 MG tablet, Take 1 tablet (10 mg total) by mouth daily as needed (for mood)., Disp: 180 tablet, Rfl: 1 .  diphenhydrAMINE (BENADRYL ALLERGY) 25 mg capsule, Take 25 mg by mouth at bedtime as needed., Disp: , Rfl:  .  hydrochlorothiazide (HYDRODIURIL) 25 MG tablet, TAKE 1 TABLET(25 MG) BY MOUTH DAILY, Disp: 90 tablet, Rfl: 1 .  ibuprofen (ADVIL) 800 MG tablet, TAKE 1 TABLET(800 MG) BY MOUTH DAILY AS NEEDED, Disp: 30 tablet, Rfl: 2 .  lisinopril (ZESTRIL) 40 MG tablet, TAKE 1  TABLET(40 MG) BY MOUTH DAILY, Disp: 90 tablet, Rfl: 1 .  NP THYROID 30 MG tablet, TAKE 1 TABLET(30 MG) BY MOUTH DAILY BEFORE BREAKFAST, Disp: 30 tablet, Rfl: 3 .  ofloxacin (OCUFLOX) 0.3 % ophthalmic solution, , Disp: , Rfl:  .  prednisoLONE acetate (PRED FORTE) 1 % ophthalmic suspension, , Disp: , Rfl:  .  Psyllium (METAMUCIL FIBER PO), Take 2 capsules by mouth 3 (three) times daily., Disp: , Rfl:  Allergies  Allergen Reactions  . Influenza Vaccines Swelling   Social History   Occupational History  . Not on file  Tobacco Use  . Smoking status: Never Smoker  . Smokeless tobacco: Never Used  Vaping Use  . Vaping Use: Never used  Substance and Sexual Activity  . Alcohol use: Yes  . Drug use: Never  . Sexual activity: Not Currently    Objective:   Constitutional Pt is a pleasant 87 y.o. Caucasian female WD, WN in NAD.Marland Kitchen AAO x 3.   Vascular Capillary fill time to digits delayed b/l lower extremities. Nonpalpable DP pulse(s) b/l lower extremities. Nonpalpable PT pulse(s) b/l lower extremities. Pedal hair absent. Lower extremity skin temperature gradient within normal limits. No pain with calf compression b/l. No edema noted b/l lower extremities. No ischemia or gangrene noted b/l lower extremities. No cyanosis or clubbing noted.  Neurologic Normal speech. Oriented to person, place, and time. Protective sensation intact 5/5 intact bilaterally with 10g monofilament b/l.  Vibratory sensation intact b/l. Proprioception intact bilaterally. Clonus negative b/l.  Dermatologic Pedal skin with normal turgor, texture and tone bilaterally. No open wounds bilaterally. No interdigital macerations bilaterally. Toenails 1-5 b/l elongated, discolored, dystrophic, thickened, crumbly with subungual debris and tenderness to dorsal palpation. No hyperkeratotic nor porokeratotic lesions present on today's visit.  Orthopedic: Normal muscle strength 5/5 to all lower extremity muscle groups bilaterally. No pain  crepitus or joint limitation noted with ROM b/l. Hallux valgus with bunion deformity noted b/l lower extremities. Hammertoes noted to the 2-5 bilaterally. Pain noted on palpation medial tubercle left heel. Pain on palpation medial tubercle right heel.   Radiographs: None Assessment:   1. Pain due to onychomycosis of toenails of both feet   2. Peripheral vascular disease ([place])    Plan:  Patient was evaluated and treated and all questions answered.  Onychomycosis with pain -Nails palliatively debridement as below. -Educated on self-care  Procedure: Nail Debridement Rationale: Pain Type of Debridement: manual, sharp debridement. Instrumentation: Nail nipper, rotary burr. Number of Nails: 10 -No new findings. No new orders. -Continue diabetic foot care principles. -Toenails 1-5 b/l were debrided in length and girth with sterile nail nippers and dremel without iatrogenic bleeding.  -Patient to report any pedal injuries to medical professional immediately. -Patient to continue soft, supportive shoe gear daily. -Patient/POA to call should there be question/concern in the interim.  Return in about 3 months (around 05/12/2020).  Marzetta Board, DPM

## 2020-02-18 ENCOUNTER — Ambulatory Visit (INDEPENDENT_AMBULATORY_CARE_PROVIDER_SITE_OTHER): Payer: Medicare HMO | Admitting: Nurse Practitioner

## 2020-02-20 ENCOUNTER — Ambulatory Visit: Payer: Medicare HMO | Admitting: Podiatry

## 2020-03-04 ENCOUNTER — Other Ambulatory Visit: Payer: Self-pay

## 2020-03-04 ENCOUNTER — Encounter (INDEPENDENT_AMBULATORY_CARE_PROVIDER_SITE_OTHER): Payer: Self-pay | Admitting: Internal Medicine

## 2020-03-04 ENCOUNTER — Ambulatory Visit (INDEPENDENT_AMBULATORY_CARE_PROVIDER_SITE_OTHER): Payer: Medicare HMO | Admitting: Internal Medicine

## 2020-03-04 VITALS — BP 133/70 | HR 87 | Temp 97.2°F | Resp 18 | Ht 59.0 in | Wt 170.8 lb

## 2020-03-04 DIAGNOSIS — E559 Vitamin D deficiency, unspecified: Secondary | ICD-10-CM

## 2020-03-04 DIAGNOSIS — G44201 Tension-type headache, unspecified, intractable: Secondary | ICD-10-CM | POA: Diagnosis not present

## 2020-03-04 DIAGNOSIS — E039 Hypothyroidism, unspecified: Secondary | ICD-10-CM

## 2020-03-04 DIAGNOSIS — I10 Essential (primary) hypertension: Secondary | ICD-10-CM | POA: Diagnosis not present

## 2020-03-04 DIAGNOSIS — F341 Dysthymic disorder: Secondary | ICD-10-CM | POA: Diagnosis not present

## 2020-03-04 NOTE — Progress Notes (Signed)
Metrics: Intervention Frequency ACO  Documented Smoking Status Yearly  Screened one or more times in 24 months  Cessation Counseling or  Active cessation medication Past 24 months  Past 24 months   Guideline developer: UpToDate (See UpToDate for funding source) Date Released: 2014       Wellness Office Visit  Subjective:  Patient ID: Nancy Schroeder, female    DOB: 02/17/1933  Age: 87 y.o. MRN: [id number]  CC: This lady comes in for follow-up of hypertension, hypothyroidism, vitamin D deficiency and anxiety and depression. HPI  She has lately been complaining of significant headaches but she does not have any other symptoms of COVID-19 disease.  This relates back to having cataract surgery more recently and she thinks that she has had the headaches after the surgery.  She has gone back to see the ophthalmologist to told her to come and see me about the headaches.  She denies any other visual symptoms or neurological symptoms with the headaches. She continues on NP thyroid for her hypothyroidism. She continues on ACE inhibitor for hypertension. Past Medical History:  Diagnosis Date  . Anxiety   . Depression   . H/O back injury 2016  . Hypertension    Past Surgical History:  Procedure Laterality Date  . ABDOMINAL HYSTERECTOMY    . CHOLECYSTECTOMY    . NECK SURGERY       Family History  Problem Relation Age of Onset  . Diabetes Mother   . Diabetes Father   . Diabetes Sister     Social History   Social History Narrative   Live with Insurance underwriter (middle)   Has 3 sons   Oldest lives in [place] in [place] (with 1 grandchild)   3 cats: lily-16 , troubles, whiskers (boys-5 years)      Enjoyed sewing, and knitting, but back injury made this worse. Read, tv, putt around, likes astrology      Diet: Eats what she wants. Enjoys veggies and fruits   Caffeine: 1-2 cups of coffee, iced tea   Water: 6-8 cups daily       Wear seatbelt   Does not wear  sunscreen   Smoke and carbon monoxide detectors         Social History   Tobacco Use  . Smoking status: Never Smoker  . Smokeless tobacco: Never Used  Substance Use Topics  . Alcohol use: Yes    Current Meds  Medication Sig  . aspirin EC 81 MG tablet Take 81 mg by mouth daily. Swallow whole.  . busPIRone (BUSPAR) 10 MG tablet Take 1 tablet (10 mg total) by mouth daily as needed (for mood).  . diphenhydrAMINE (BENADRYL) 25 mg capsule Take 25 mg by mouth at bedtime as needed.  . doxycycline (VIBRAMYCIN) 100 MG capsule Take 1 capsule by mouth daily.  . hydrochlorothiazide (HYDRODIURIL) 25 MG tablet TAKE 1 TABLET(25 MG) BY MOUTH DAILY  . ibuprofen (ADVIL) 800 MG tablet TAKE 1 TABLET(800 MG) BY MOUTH DAILY AS NEEDED  . lisinopril (ZESTRIL) 40 MG tablet TAKE 1 TABLET(40 MG) BY MOUTH DAILY  . NP THYROID 30 MG tablet TAKE 1 TABLET(30 MG) BY MOUTH DAILY BEFORE BREAKFAST  . ofloxacin (OCUFLOX) 0.3 % ophthalmic solution   . prednisoLONE acetate (PRED FORTE) 1 % ophthalmic suspension   . Psyllium (METAMUCIL FIBER PO) Take 2 capsules by mouth 3 (three) times daily.      Depression screen [facility] 2/9 03/04/2020 04/19/2019 02/06/2019 11/01/2018 08/02/2018  Decreased Interest 0 0 0  0 0  Down, Depressed, Hopeless 3 0 1 2 1   PHQ - 2 Score 3 0 1 2 1   Altered sleeping 0 - - 0 -  Tired, decreased energy 1 - - 3 -  Change in appetite 1 - - 3 -  Feeling bad or failure about yourself  0 - - 3 -  Trouble concentrating 1 - - 1 -  Moving slowly or fidgety/restless 1 - - 0 -  Suicidal thoughts - - - 0 -  PHQ-9 Score 7 - - 12 -  Difficult doing work/chores Somewhat difficult - - - -     Objective:   Today's Vitals: BP 133/70 (BP Location: Left Arm, Patient Position: Sitting, Cuff Size: Normal)   Pulse 87   Temp (!) 97.2 F (36.2 C) (Temporal)   Resp 18   Ht 4\' 11"  (1.499 m)   Wt 170 lb 12.8 oz (77.5 kg)   SpO2 98%   BMI 34.50 kg/m  Vitals with BMI 03/04/2020 11/14/2019 08/14/2019  Height 4\' 11"  4'  11" -  Weight 170 lbs 13 oz 171 lbs 6 oz -  BMI 81.44 81.8 -  Systolic 563 149 702  Diastolic 70 84 80  Pulse 87 80 78     Physical Exam  Her blood pressure is well controlled.  Weight is stable.  She is alert and orientated without any focal neurological signs.     Assessment   1. Essential hypertension   2. Hypothyroidism, adult   3. Vitamin D deficiency disease   4. ANXIETY DEPRESSION   5. Acute intractable tension-type headache       Tests ordered Orders Placed This Encounter  Procedures  . COMPLETE METABOLIC PANEL WITH GFR  . T3, free  . TSH  . Sedimentation rate     Plan: 1. She will continue with ACE inhibitor and we will check electrolytes today. 2. She will continue with the current dose of NP thyroid and I will check thyroid function test today. 3. As far as the headache is concerned, I want to make sure she does not have temporal arteritis and we will check a sed rate. 4. Further recommendations will depend on blood results and she will follow-up with Sarah in 3 to 4 months.   No orders of the defined types were placed in this encounter.   Doree Albee, MD

## 2020-03-05 ENCOUNTER — Other Ambulatory Visit (INDEPENDENT_AMBULATORY_CARE_PROVIDER_SITE_OTHER): Payer: Self-pay | Admitting: Internal Medicine

## 2020-03-05 LAB — COMPLETE METABOLIC PANEL WITHOUT GFR
AG Ratio: 1.5 (calc) (ref 1.0–2.5)
ALT: 20 U/L (ref 6–29)
AST: 17 U/L (ref 10–35)
Albumin: 3.7 g/dL (ref 3.6–5.1)
Alkaline phosphatase (APISO): 65 U/L (ref 37–153)
BUN: 11 mg/dL (ref 7–25)
CO2: 24 mmol/L (ref 20–32)
Calcium: 9.2 mg/dL (ref 8.6–10.4)
Chloride: 111 mmol/L — ABNORMAL HIGH (ref 98–110)
Creat: 0.82 mg/dL (ref 0.60–0.88)
GFR, Est African American: 76 mL/min/1.73m2
GFR, Est Non African American: 66 mL/min/1.73m2
Globulin: 2.5 g/dL (ref 1.9–3.7)
Glucose, Bld: 107 mg/dL (ref 65–139)
Potassium: 4.1 mmol/L (ref 3.5–5.3)
Sodium: 142 mmol/L (ref 135–146)
Total Bilirubin: 0.4 mg/dL (ref 0.2–1.2)
Total Protein: 6.2 g/dL (ref 6.1–8.1)

## 2020-03-05 LAB — T3, FREE: T3, Free: 3.2 pg/mL (ref 2.3–4.2)

## 2020-03-05 LAB — SEDIMENTATION RATE: Sed Rate: 33 mm/h — ABNORMAL HIGH (ref 0–30)

## 2020-03-05 LAB — TSH: TSH: 2.05 mIU/L (ref 0.40–4.50)

## 2020-03-05 MED ORDER — MUPIROCIN CALCIUM 2 % EX CREA: 1.0000 "application " | TOPICAL_CREAM | Freq: Two times a day (BID) | CUTANEOUS | 0 refills | Status: DC

## 2020-03-05 NOTE — Progress Notes (Signed)
Please call this patient and let her know that blood work is okay.  If she continues to get the headaches, she needs to make another appointment for [place] to see her, she should get an appointment with Judson Roch next week if she needs to see [place]. Follow-up is scheduled.

## 2020-03-05 NOTE — Progress Notes (Signed)
Patient called.  Pt agreed to being seen by sarah Gray,NP. She feels the pain in her skull where she had eye surgery. Not happy about the way she is feeling. Very tearful and nervious. Pt will be back 2/9 for acute with sarah to be seen.

## 2020-03-05 NOTE — Progress Notes (Signed)
Pt said she would like to do more testing & maybe see what is going on.   Also ask for a refill on med for the rash tat she got at urgent care. She had a tube with name of medication. Mupirocin 2% cream; to apply to rash on leg area. PRN.

## 2020-03-06 ENCOUNTER — Other Ambulatory Visit (INDEPENDENT_AMBULATORY_CARE_PROVIDER_SITE_OTHER): Payer: Self-pay | Admitting: Internal Medicine

## 2020-03-06 ENCOUNTER — Other Ambulatory Visit (INDEPENDENT_AMBULATORY_CARE_PROVIDER_SITE_OTHER): Payer: Self-pay

## 2020-03-06 NOTE — Telephone Encounter (Signed)
I do not understand.  What alternative does the pharmacy want me to prescribe?

## 2020-03-07 NOTE — Telephone Encounter (Signed)
Faxed asked for a alternative for her cream. That's all it was from fax. Will call and ask.

## 2020-03-12 ENCOUNTER — Ambulatory Visit (INDEPENDENT_AMBULATORY_CARE_PROVIDER_SITE_OTHER): Payer: Medicare HMO | Admitting: Nurse Practitioner

## 2020-04-03 ENCOUNTER — Other Ambulatory Visit (INDEPENDENT_AMBULATORY_CARE_PROVIDER_SITE_OTHER): Payer: Self-pay | Admitting: Internal Medicine

## 2020-04-07 ENCOUNTER — Telehealth (INDEPENDENT_AMBULATORY_CARE_PROVIDER_SITE_OTHER): Payer: Self-pay

## 2020-04-07 NOTE — Telephone Encounter (Signed)
Would you recommend we switch her to levothyroxine?

## 2020-04-07 NOTE — Telephone Encounter (Signed)
Yes, we can switch to levothyroxine if the patient does not want to pay out-of-pocket for the NP thyroid.

## 2020-04-07 NOTE — Telephone Encounter (Signed)
Received a fax from [facility] on [address] requesting a change since the following medication is not covered under the patients insurance plan:  NP THYROID 30 MG tablet   Fax states that the following are covered: Levothyroxine Sodium, Erthyrox, Liothyronine Sodium  Not sure if patient pays out of pocket for the NP Thyroid or if you need to change the prescription?  Please advise.

## 2020-04-08 NOTE — Telephone Encounter (Signed)
Called patient and let her know about her NP Thyroid and she stated that it cost her $11 and she will pay out of pocket for now and continue this medication as she feels like it is helping her. Patient verbalized an understanding and thanked [place].

## 2020-04-08 NOTE — Telephone Encounter (Signed)
Will you call this patient and let her know that insurance will not pay for her NP thyroid. She can either pay for it out of pocket or we can change it to a covered medication. Let me know what she would prefer. If she wants to change I will order levothyroxine.

## 2020-04-12 ENCOUNTER — Other Ambulatory Visit (INDEPENDENT_AMBULATORY_CARE_PROVIDER_SITE_OTHER): Payer: Self-pay | Admitting: Internal Medicine

## 2020-04-16 ENCOUNTER — Emergency Department (HOSPITAL_COMMUNITY): Payer: Medicare HMO

## 2020-04-16 ENCOUNTER — Encounter (HOSPITAL_COMMUNITY): Payer: Self-pay

## 2020-04-16 ENCOUNTER — Other Ambulatory Visit: Payer: Self-pay

## 2020-04-16 ENCOUNTER — Emergency Department (HOSPITAL_COMMUNITY)
Admission: EM | Admit: 2020-04-16 | Discharge: 2020-04-16 | Disposition: A | Discharge status: Home / Self Care | Payer: Medicare HMO | Attending: Emergency Medicine | Admitting: Emergency Medicine

## 2020-04-16 DIAGNOSIS — R079 Chest pain, unspecified: Secondary | ICD-10-CM | POA: Diagnosis not present

## 2020-04-16 DIAGNOSIS — R531 Weakness: Secondary | ICD-10-CM | POA: Insufficient documentation

## 2020-04-16 DIAGNOSIS — Z79899 Other long term (current) drug therapy: Secondary | ICD-10-CM | POA: Insufficient documentation

## 2020-04-16 DIAGNOSIS — I10 Essential (primary) hypertension: Secondary | ICD-10-CM | POA: Insufficient documentation

## 2020-04-16 DIAGNOSIS — R0789 Other chest pain: Secondary | ICD-10-CM | POA: Diagnosis not present

## 2020-04-16 DIAGNOSIS — R0609 Other forms of dyspnea: Secondary | ICD-10-CM | POA: Insufficient documentation

## 2020-04-16 DIAGNOSIS — R0602 Shortness of breath: Secondary | ICD-10-CM | POA: Diagnosis not present

## 2020-04-16 LAB — COMPREHENSIVE METABOLIC PANEL
ALT: 27 U/L (ref 0–44)
AST: 24 U/L (ref 15–41)
Albumin: 3.4 g/dL — ABNORMAL LOW (ref 3.5–5.0)
Alkaline Phosphatase: 52 U/L (ref 38–126)
Anion gap: 10 (ref 5–15)
BUN: 19 mg/dL (ref 8–23)
CO2: 22 mmol/L (ref 22–32)
Calcium: 8.7 mg/dL — ABNORMAL LOW (ref 8.9–10.3)
Chloride: 108 mmol/L (ref 98–111)
Creatinine, Ser: 0.99 mg/dL (ref 0.44–1.00)
GFR, Estimated: 56 mL/min — ABNORMAL LOW (ref >60)
Glucose, Bld: 190 mg/dL — ABNORMAL HIGH (ref 70–99)
Potassium: 3.8 mmol/L (ref 3.5–5.1)
Sodium: 140 mmol/L (ref 135–145)
Total Bilirubin: 0.5 mg/dL (ref 0.3–1.2)
Total Protein: 6.3 g/dL — ABNORMAL LOW (ref 6.5–8.1)

## 2020-04-16 LAB — LIPASE, BLOOD: Lipase: 37 U/L (ref 11–51)

## 2020-04-16 LAB — CBC WITH DIFFERENTIAL/PLATELET
Abs Immature Granulocytes: 0.02 10*3/uL (ref 0.00–0.07)
Basophils Absolute: 0.1 10*3/uL (ref 0.0–0.1)
Basophils Relative: 1 %
Eosinophils Absolute: 0.1 10*3/uL (ref 0.0–0.5)
Eosinophils Relative: 1 %
HCT: 37.5 % (ref 36.0–46.0)
Hemoglobin: 12.4 g/dL (ref 12.0–15.0)
Immature Granulocytes: 0 %
Lymphocytes Relative: 25 %
Lymphs Abs: 1.8 10*3/uL (ref 0.7–4.0)
MCH: 31.2 pg (ref 26.0–34.0)
MCHC: 33.1 g/dL (ref 30.0–36.0)
MCV: 94.5 fL (ref 80.0–100.0)
Monocytes Absolute: 0.5 10*3/uL (ref 0.1–1.0)
Monocytes Relative: 7 %
Neutro Abs: 4.9 10*3/uL (ref 1.7–7.7)
Neutrophils Relative %: 66 %
Platelets: 255 10*3/uL (ref 150–400)
RBC: 3.97 MIL/uL (ref 3.87–5.11)
RDW: 12.2 % (ref 11.5–15.5)
WBC: 7.4 10*3/uL (ref 4.0–10.5)
nRBC: 0 % (ref 0.0–0.2)

## 2020-04-16 LAB — TROPONIN I (HIGH SENSITIVITY)
Troponin I (High Sensitivity): 6 ng/L (ref <18)
Troponin I (High Sensitivity): 6 ng/L (ref <18)

## 2020-04-16 MED ORDER — ACETAMINOPHEN 325 MG PO TABS
650.0000 mg | ORAL_TABLET | Freq: Once | ORAL | Status: AC
  Administered 2020-04-16: 650 mg via ORAL
  Filled 2020-04-16: qty 2

## 2020-04-16 NOTE — Discharge Instructions (Addendum)
Your history and exam is concerning for angina.  I have provided you with a referral to cardiology and you should receive a phone call from them in the next 48 hours to schedule an appointment.  Your comprehensive work-up today was reassuring.  You have been experiencing ongoing headache symptoms ever since your ophthalmic procedure and I encourage you to follow-up with your primary care provider or ophthalmologist about these symptoms.  You were offered a head CT today, but declined.  If your symptoms continue to persist, you may be able to arrange for outpatient head CT.  Please follow-up with your new primary care provider regarding today's encounter for ongoing evaluation and management.  Please have a low threshold to return to the ED or seek immediate medical attention should you experience any new or worsening symptoms.

## 2020-04-16 NOTE — ED Triage Notes (Signed)
Pt to er, pt states that Friday when she was in the bath she started having some chest pain, states that it was a debilitating pain and she had to crawl out of the tub, states that since then she has had a constant pain, states that today she is also having some shortness of breath, states that his has gotten worse.

## 2020-04-16 NOTE — ED Provider Notes (Signed)
[facility] EMERGENCY DEPARTMENT Provider Note   CSN: [id number] Arrival date & time: 04/16/20  1221     History Chief Complaint  Patient presents with  . Chest Pain    Nancy Schroeder is a 87 y.o. female with past medical history of hypertension presents the ED with complaints of chest pain.  On my examination, patient is anxious appearing.  She states that on 04/11/2020 she developed central chest discomfort while in the bathtub.  She states that it went to her arms bilaterally.  She also states that she has a severe pressure in the middle of her back, worse when doing chores around the house.  She states that she feels very weak and fatigued when doing chores, but does not want to be labeled as lazy.  This morning she felt particularly short of breath, worse with exertion.  Patient also states that she has been having chills recently.  Denies any fevers or cough.  No history of clots or clotting disorder.  Denies any abdominal pain, nausea vomiting, diaphoresis, or other symptoms.  She is dissatisfied with her primary care provider and plans to reestablish elsewhere.  She has not seen a cardiologist in the past.   HPI   Past Medical History:  Diagnosis Date  . Anxiety   . Depression   . H/O back injury 2016  . Hypertension     Patient Active Problem List   Diagnosis Date Noted  . Rash and nonspecific skin eruption 04/19/2019  . Forgetfulness 02/06/2019  . Acute midline thoracic back pain 02/06/2019  . Obesity (BMI 30.0-34.9) 02/06/2019  . Pain due to onychomycosis of toenails of both feet 09/01/2018  . Headache 03/09/2007  . ANXIETY DEPRESSION 02/20/2007  . NEUROPATHY 02/20/2007  . Essential hypertension 02/20/2007  . LOW BACK PAIN, CHRONIC 02/20/2007  . MALAISE AND FATIGUE 02/20/2007    Past Surgical History:  Procedure Laterality Date  . ABDOMINAL HYSTERECTOMY    . CHOLECYSTECTOMY    . NECK SURGERY       OB History   No obstetric history on file.      Family History  Problem Relation Age of Onset  . Diabetes Mother   . Diabetes Father   . Diabetes Sister     Social History   Tobacco Use  . Smoking status: Never Smoker  . Smokeless tobacco: Never Used  Vaping Use  . Vaping Use: Never used  Substance Use Topics  . Alcohol use: Yes  . Drug use: Never    Home Medications Prior to Admission medications   Medication Sig Start Date End Date Taking? Authorizing Provider  mupirocin cream (BACTROBAN) 2 % Apply 1 application topically 2 (two) times daily. 03/05/20   Doree Albee, MD  aspirin EC 81 MG tablet Take 81 mg by mouth daily. Swallow whole.    [provider]  busPIRone (BUSPAR) 10 MG tablet Take 1 tablet (10 mg total) by mouth daily as needed (for mood). 11/14/19   Ailene Ards, NP  diphenhydrAMINE (BENADRYL) 25 mg capsule Take 25 mg by mouth at bedtime as needed.    [provider]  doxycycline (VIBRAMYCIN) 100 MG capsule Take 1 capsule by mouth daily. 11/03/19   [provider]  hydrochlorothiazide (HYDRODIURIL) 25 MG tablet TAKE 1 TABLET(25 MG) BY MOUTH DAILY 01/16/20   Hurshel Party C, MD  ibuprofen (ADVIL) 800 MG tablet TAKE 1 TABLET(800 MG) BY MOUTH DAILY AS NEEDED 11/30/19   Ailene Ards, NP  lisinopril (ZESTRIL) 40  MG tablet TAKE 1 TABLET(40 MG) BY MOUTH DAILY 04/13/20   Hurshel Party C, MD  mupirocin cream (BACTROBAN) 2 % Apply 1 application topically 2 (two) times daily.    [provider]  NP THYROID 30 MG tablet TAKE 1 TABLET(30 MG) BY MOUTH DAILY BEFORE BREAKFAST 04/03/20   Ailene Ards, NP  ofloxacin (OCUFLOX) 0.3 % ophthalmic solution  01/16/20   [provider]  prednisoLONE acetate (PRED FORTE) 1 % ophthalmic suspension  01/16/20   [provider]  Psyllium (METAMUCIL FIBER PO) Take 2 capsules by mouth 3 (three) times daily.    [provider]    Allergies    Influenza vaccines  Review of Systems   Review of Systems  Physical  Exam Updated Vital Signs BP (!) 177/61   Pulse 67   Temp 98 F (36.7 C) (Oral)   Resp 19   Ht 5' (1.524 m)   Wt 78.9 kg   SpO2 97%   BMI 33.98 kg/m   Physical Exam  ED Results / Procedures / Treatments   Labs (all labs ordered are listed, but only abnormal results are displayed) Labs Reviewed  COMPREHENSIVE METABOLIC PANEL - Abnormal; Notable for the following components:      Result Value   Glucose, Bld 190 (*)    Calcium 8.7 (*)    Total Protein 6.3 (*)    Albumin 3.4 (*)    GFR, Estimated 56 (*)    All other components within normal limits  CBC WITH DIFFERENTIAL/PLATELET  LIPASE, BLOOD  TROPONIN I (HIGH SENSITIVITY)  TROPONIN I (HIGH SENSITIVITY)    EKG EKG Interpretation  Date/Time:  Wednesday April 16 2020 12:37:25 EDT Ventricular Rate:  89 PR Interval:    QRS Duration: 94 QT Interval:  404 QTC Calculation: 492 R Axis:   -10 Text Interpretation: Sinus rhythm LVH by voltage Borderline T abnormalities, diffuse leads Borderline prolonged QT interval Confirmed by Thamas Jaegers ([id number]) on 04/16/2020 2:18:54 PM   Radiology DG Chest Portable 1 View  Result Date: 04/16/2020 CLINICAL DATA:  Chest pain, weakness and shortness of breath. EXAM: PORTABLE CHEST 1 VIEW COMPARISON:  None. FINDINGS: The lungs are clear. Heart size is normal. Aortic atherosclerosis. No pneumothorax or pleural fluid. No acute or focal bony abnormality. IMPRESSION: No acute disease. Aortic Atherosclerosis (ICD10-I70.0). Electronically Signed   By: Inge Rise M.D.   On: 04/16/2020 12:59    Procedures Procedures   Medications Ordered in ED Medications  acetaminophen (TYLENOL) tablet 650 mg (650 mg Oral Given 04/16/20 1504)    ED Course  I have reviewed the triage vital signs and the nursing notes.  Pertinent labs & imaging results that were available during my care of the patient were reviewed by me and considered in my medical decision making (see chart for details).    MDM  Rules/Calculators/A&P HEAR Score: 6                        Nancy Schroeder was evaluated in Emergency Department on 04/16/2020 for the symptoms described in the history of present illness. She was evaluated in the context of the global COVID-19 pandemic, which necessitated consideration that the patient might be at risk for infection with the SARS-CoV-2 virus that causes COVID-19. Institutional protocols and algorithms that pertain to the evaluation of patients at risk for COVID-19 are in a state of rapid change based on information released by regulatory bodies including the CDC and federal and state  organizations. These policies and algorithms were followed during the patient's care in the ED.  I personally reviewed patient's medical chart and all notes from triage and staff during today's encounter. I have also ordered and reviewed all labs and imaging that I felt to be medically necessary in the evaluation of this patient's complaints and with consideration of their physical exam. If needed, translation services were available and utilized.   DG chest is personally reviewed and without any evidence of acute cardiopulmonary disease.  CMP is largely unremarkable aside from mildly elevated blood sugar to 190.  Lipase is within normal limits.  CBC without any abnormalities.  Troponin trended x2 given patient's elevated HEAR score of 6.   Troponin: 6 >> 6.  Comprehensive work-up obtained to assess for cause of symptoms.  EKG without evidence of STEMI.  Troponin is negative, lowering concern for NSTEMI.  I have low suspicion for dissection given normal pulses in extremities and normal mediastinum on CXR.  I reviewed DG Chest and there is no evidence of pneumothorax or consolidation concerning for pneumonia.  There is also no pleural effusion on x-ray or history suggestive of possible esophageal rupture.  Patient is low risk for PE per Wells Criteria.  I discussed the patient the exact etiology of their  chest pain is unclear and warrants follow up with a primary care provider. Also discussed that while their risk for ACS is low, it is not completely eliminated and I discussed with patient specific warning signs and return precautions.  She states that she has been having pretty significant headaches ever since she had cataract surgery with Dr. Gershon Crane on 03/11/2020.  She endorses a history of headaches, but states that they have been worse lately.  She points to the left side of her head.  She is asking for something for pain here in the ED.  Will obtain CT head without contrast to assess for space-occupying lesion or other cause for her progressively worsening headache symptoms.  She has not had any imaging of her head that I can see on record.  Patient notified RN that she would like to leave.  She does not want to wait for head CT.  She states that her significant headache improved after Tylenol administration and would like to simply follow-up with her primary care provider.  I communicated my concern, but she voiced understanding of the risks and would like to proceed to go home.  Given her resolution of symptoms and normal neuro exam, I have low concern for emergent pathology.  I feel as though it is reasonable.    Patient has been charted as being tachypneic, however she has been demonstrating no significant increased work of breathing during my examination.  Patient also states that she has not had any further chest pain since arrival to the ED.  She is reassured by today's conference work-up.  Her son is at bedside.  She lives here in [place], [place].  Will provide ambulatory referral to [facility].  Discussed case with Dr. Almyra Free who personally evaluated patient and agrees with assessment and plan.     Final Clinical Impression(s) / ED Diagnoses Final diagnoses:  Nonspecific chest pain    Rx / DC Orders ED Discharge Orders         Ordered    Ambulatory referral to Cardiology         04/16/20 1710           Corena Herter, Hershal Coria 04/16/20 1713  Luna Fuse, MD 04/23/20 [phone]

## 2020-04-21 ENCOUNTER — Ambulatory Visit: Payer: Medicare HMO | Admitting: Cardiology

## 2020-04-21 ENCOUNTER — Encounter: Payer: Self-pay | Admitting: Nurse Practitioner

## 2020-04-21 ENCOUNTER — Encounter: Payer: Self-pay | Admitting: *Deleted

## 2020-04-21 ENCOUNTER — Other Ambulatory Visit: Payer: Self-pay

## 2020-04-21 ENCOUNTER — Ambulatory Visit (INDEPENDENT_AMBULATORY_CARE_PROVIDER_SITE_OTHER): Payer: Medicare HMO | Admitting: Nurse Practitioner

## 2020-04-21 ENCOUNTER — Encounter: Payer: Self-pay | Admitting: Cardiology

## 2020-04-21 VITALS — BP 132/74 | HR 76 | Wt 171.6 lb

## 2020-04-21 DIAGNOSIS — E039 Hypothyroidism, unspecified: Secondary | ICD-10-CM | POA: Diagnosis not present

## 2020-04-21 DIAGNOSIS — R079 Chest pain, unspecified: Secondary | ICD-10-CM | POA: Diagnosis not present

## 2020-04-21 DIAGNOSIS — Z7689 Persons encountering health services in other specified circumstances: Secondary | ICD-10-CM

## 2020-04-21 DIAGNOSIS — R0600 Dyspnea, unspecified: Secondary | ICD-10-CM | POA: Diagnosis not present

## 2020-04-21 DIAGNOSIS — R0602 Shortness of breath: Secondary | ICD-10-CM | POA: Diagnosis not present

## 2020-04-21 DIAGNOSIS — E038 Other specified hypothyroidism: Secondary | ICD-10-CM | POA: Insufficient documentation

## 2020-04-21 DIAGNOSIS — I10 Essential (primary) hypertension: Secondary | ICD-10-CM

## 2020-04-21 DIAGNOSIS — R0609 Other forms of dyspnea: Secondary | ICD-10-CM | POA: Insufficient documentation

## 2020-04-21 MED ORDER — AMLODIPINE BESYLATE 5 MG PO TABS: 5.0000 mg | ORAL_TABLET | Freq: Every day | ORAL | 3 refills | Status: DC

## 2020-04-21 NOTE — Assessment & Plan Note (Signed)
-  has been taking NP thyroid 30 mg daily -last labs from Feb 2022 were WNL

## 2020-04-21 NOTE — Progress Notes (Signed)
New Patient Office Visit  Subjective:  Patient ID: Nancy Schroeder, female    DOB: [birth year]  Age: 87 y.o. MRN: [id number]  CC:  Chief Complaint  Patient presents with   New Patient (Initial Visit)    HPI Nancy Schroeder presents for new patient visit. Transferring care from Dr. Anastasio Champion. Last physical was over a year ago. Had labs drawn at ED on 04/17/19  She has appointment today with cardiology.  She is getting SOB with exertion. She has been having chest pain. Went to ED, and work-up was negative for NSTEMI.  Past Medical History:  Diagnosis Date   Anxiety    Depression    Essential hypertension    H/O back injury 2016    Past Surgical History:  Procedure Laterality Date   ABDOMINAL HYSTERECTOMY     total   CHOLECYSTECTOMY     NECK SURGERY      Family History  Problem Relation Age of Onset   Diabetes Mother    Diabetes Father    Diabetes Sister     Social History   Socioeconomic History   Marital status: Widowed    Spouse name: Not on file   Number of children: 3   Years of education: Not on file   Highest education level: 8th grade  Occupational History   Occupation: retired  Tobacco Use   Smoking status: Never Smoker   Smokeless tobacco: Never Used  Scientific laboratory technician Use: Never used  Substance and Sexual Activity   Alcohol use: Yes    Comment: 1-2 drinks per 6 months   Drug use: Never   Sexual activity: Not Currently    Birth control/protection: Surgical, Post-menopausal  Other Topics Concern   Not on file  Social History Narrative   Live with Insurance underwriter (middle)   Has 3 sons   Oldest lives in [place] lives in [place] (with 1 grandchild)   3 cats: lily-16 , troubles, whiskers (boys-5 years)      Enjoyed sewing, and knitting, but back injury made this worse. Read, tv, putt around, likes astrology      Diet: Eats what she wants. Enjoys veggies and fruits   Caffeine: 1-2 cups of coffee, iced tea    Water: 6-8 cups daily       Wear seatbelt   Does not wear sunscreen   Smoke and carbon monoxide detectors         Social Determinants of Health   Financial Resource Strain: Not on file  Food Insecurity: Not on file  Transportation Needs: Not on file  Physical Activity: Not on file  Stress: Not on file  Social Connections: Not on file  Intimate Partner Violence: Not on file    ROS Review of Systems  Constitutional: Negative.   Respiratory: Positive for shortness of breath. Negative for cough, chest tightness and wheezing.        SOB with mild exertion  Cardiovascular: Negative.   Musculoskeletal: Negative.   Psychiatric/Behavioral: Negative.     Objective:   Today's Vitals: BP (!) 185/77    Pulse 74    Temp 97.7 F (36.5 C)    Resp 20    Ht 5' (1.524 m)    Wt 171 lb (77.6 kg)    SpO2 98%    BMI 33.40 kg/m   Physical Exam Constitutional:      Comments: SOB after walking down the hallway; O2 sat 98%; SOB resolved after sitting and resting  Cardiovascular:  Rate and Rhythm: Normal rate and regular rhythm.     Pulses: Normal pulses.     Heart sounds: Normal heart sounds.  Pulmonary:     Effort: Pulmonary effort is normal.     Breath sounds: Normal breath sounds.  Musculoskeletal:        General: Normal range of motion.  Neurological:     Mental Status: She is alert.  Psychiatric:        Mood and Affect: Mood normal.        Behavior: Behavior normal.        Thought Content: Thought content normal.        Judgment: Judgment normal.     Assessment & Plan:   Problem List Items Addressed This Visit      Cardiovascular and Mediastinum   Essential hypertension    BP Readings from Last 3 Encounters:  04/21/20 (!) 185/77  04/16/20 (!) 144/71  03/04/20 133/70  -having SOB with exertion -taking lisinopril and HCTZ       Relevant Medications   amLODipine (NORVASC) 5 MG tablet   Other Relevant Orders   Lipid Panel With LDL/HDL Ratio   Basic Metabolic  Panel (BMET)     Endocrine   Hypothyroidism    -has been taking NP thyroid 30 mg daily -last labs from Feb 2022 were WNL        Other   Encounter to establish care    -transferring from [facility]; records in [facility]      Relevant Orders   Lipid Panel With LDL/HDL Ratio   Basic Metabolic Panel (BMET)   Exertional dyspnea    -BP elevated -has cardiology appointment this afternoon -EKG today shows NSR with LVH -had workup at ED on 04/16/20 that was negative for NSTEMI      Relevant Orders   Lipid Panel With LDL/HDL Ratio   Basic Metabolic Panel (BMET)      Outpatient Encounter Medications as of 04/21/2020  Medication Sig   amLODipine (NORVASC) 5 MG tablet Take 1 tablet (5 mg total) by mouth daily.   aspirin EC 81 MG tablet Take 81 mg by mouth daily. Swallow whole.   busPIRone (BUSPAR) 10 MG tablet Take 1 tablet (10 mg total) by mouth daily as needed (for mood).   diphenhydrAMINE (BENADRYL) 25 mg capsule Take 25 mg by mouth at bedtime as needed.   hydrochlorothiazide (HYDRODIURIL) 25 MG tablet TAKE 1 TABLET(25 MG) BY MOUTH DAILY   ibuprofen (ADVIL) 800 MG tablet TAKE 1 TABLET(800 MG) BY MOUTH DAILY AS NEEDED   lisinopril (ZESTRIL) 40 MG tablet TAKE 1 TABLET(40 MG) BY MOUTH DAILY   NP THYROID 30 MG tablet TAKE 1 TABLET(30 MG) BY MOUTH DAILY BEFORE BREAKFAST   ofloxacin (OCUFLOX) 0.3 % ophthalmic solution    prednisoLONE acetate (PRED FORTE) 1 % ophthalmic suspension    Psyllium (METAMUCIL FIBER PO) Take 2 capsules by mouth 3 (three) times daily.   [DISCONTINUED] doxycycline (VIBRAMYCIN) 100 MG capsule Take 1 capsule by mouth daily.   [DISCONTINUED] mupirocin cream (BACTROBAN) 2 % Apply 1 application topically 2 (two) times daily.   [DISCONTINUED] mupirocin cream (BACTROBAN) 2 % Apply 1 application topically 2 (two) times daily.   No facility-administered encounter medications on file as of 04/21/2020.    Follow-up: Return in about 1 week (around  04/28/2020) for BP check.   Nancy Larsson, NP

## 2020-04-21 NOTE — Assessment & Plan Note (Addendum)
BP Readings from Last 3 Encounters:  04/21/20 (!) 185/77  04/16/20 (!) 144/71  03/04/20 133/70  -having SOB with exertion -taking lisinopril and HCTZ

## 2020-04-21 NOTE — Assessment & Plan Note (Addendum)
-  BP elevated -has cardiology appointment this afternoon -EKG today shows NSR with LVH -had workup at ED on 04/16/20 that was negative for NSTEMI

## 2020-04-21 NOTE — Patient Instructions (Addendum)
Medication Instructions:  Your physician recommends that you continue on your current medications as directed. Please refer to the Current Medication list given to you today.  Labwork: none  Testing/Procedures: Your physician has requested that you have an echocardiogram. Echocardiography is a painless test that uses sound waves to create images of your heart. It provides your doctor with information about the size and shape of your heart and how well your heart's chambers and valves are working. This procedure takes approximately one hour. There are no restrictions for this procedure. Your physician has requested that you have a lexiscan myoview. For further information please visit www.cardiosmart.org. Please follow instruction sheet, as given.  Follow-Up: Your physician recommends that you schedule a follow-up appointment in: pending  Any Other Special Instructions Will Be Listed Below (If Applicable).  If you need a refill on your cardiac medications before your next appointment, please call your pharmacy. 

## 2020-04-21 NOTE — Patient Instructions (Signed)
I sent in a prescription for amlodipine for your elevated BP. This may help with your shortness of breath, but don't start taking it until you talk to cardiology. They may change your medication.

## 2020-04-21 NOTE — Assessment & Plan Note (Signed)
-  transferring from [facility]; records in [facility]

## 2020-04-21 NOTE — Progress Notes (Signed)
Cardiology Office Note  Date: 04/21/2020   ID: [name], DOB [birth year], MRN [id number]  PCP:  Noreene Larsson, NP  Cardiologist:  Rozann Lesches, MD Electrophysiologist:  None   Chief Complaint  Patient presents with  . Chest Pain    History of Present Illness: Nancy Schroeder is an 87 y.o. female referred for cardiology consultation by Mr. Green PA-C for the evaluation of chest discomfort.  She was seen in the [facility] on March 16, I reviewed the records.  She is here today with her son.  She states that over the last few years she has been experiencing fatigue overall.  She and her son were living in [place] at the time, she states that she saw a cardiovascular specialist and underwent reassuring cardiac testing, possibly a stress test based on discussion today.  More recently within the last 6 months or so she has been experiencing intermittent discomfort in her chest and shortness of breath, also pain in her mid scapular area.  This is not consistently exertional.  She describes the chest discomfort as a tightness.  Sometimes soreness in her upper back and chest as well.  No wheezing or cough.  No palpitations or frank syncope.  She still feels fatigued.  She just established with Mr. Pearline Cables NP for primary care, saw him in the office this morning, I reviewed the note.  Systolic was in the 409W at that time and she was prescribed Norvasc 5 mg daily in addition to her present medications.  She has not picked this up yet.  Does not have a blood pressure cuff for home assessment.  Her systolic is in the 119J here.  I reviewed the testing from her recent ER evaluation, outlined below.  I talked with her about arranging additional cardiac structural and ischemic testing.  Past Medical History:  Diagnosis Date  . Anxiety   . Depression   . Essential hypertension   . H/O back injury 2016    Past Surgical History:  Procedure Laterality Date  . ABDOMINAL  HYSTERECTOMY     total  . CATARACT EXTRACTION, BILATERAL  01/2020  . CHOLECYSTECTOMY    . NECK SURGERY      Current Outpatient Medications  Medication Sig Dispense Refill  . amLODipine (NORVASC) 5 MG tablet Take 1 tablet (5 mg total) by mouth daily. 90 tablet 3  . aspirin EC 81 MG tablet Take 81 mg by mouth daily. Swallow whole.    . busPIRone (BUSPAR) 10 MG tablet Take 1 tablet (10 mg total) by mouth daily as needed (for mood). 180 tablet 1  . diphenhydrAMINE (BENADRYL) 25 mg capsule Take 25 mg by mouth at bedtime as needed.    . hydrochlorothiazide (HYDRODIURIL) 25 MG tablet TAKE 1 TABLET(25 MG) BY MOUTH DAILY 90 tablet 1  . ibuprofen (ADVIL) 800 MG tablet TAKE 1 TABLET(800 MG) BY MOUTH DAILY AS NEEDED 30 tablet 2  . lisinopril (ZESTRIL) 40 MG tablet TAKE 1 TABLET(40 MG) BY MOUTH DAILY 90 tablet 1  . NP THYROID 30 MG tablet TAKE 1 TABLET(30 MG) BY MOUTH DAILY BEFORE BREAKFAST 30 tablet 3  . Psyllium (METAMUCIL FIBER PO) Take 2 capsules by mouth 3 (three) times daily.     No current facility-administered medications for this visit.   Allergies:  Influenza vaccines   Social History: The patient  reports that she has never smoked. She has never used smokeless tobacco. She reports current alcohol use. She reports that she  does not use drugs.   Family History: The patient's family history includes Diabetes in her father, mother, and sister.   ROS: No palpitations or syncope.  Physical Exam: VS:  BP 132/74 (BP Location: Right Arm, Patient Position: Sitting, Cuff Size: Normal)   Pulse 76   Wt 171 lb 9.6 oz (77.8 kg)   SpO2 99%   BMI 33.51 kg/m , BMI Body mass index is 33.51 kg/m.  Wt Readings from Last 3 Encounters:  04/21/20 171 lb 9.6 oz (77.8 kg)  04/21/20 171 lb (77.6 kg)  04/16/20 174 lb (78.9 kg)    General: Elderly woman, appears comfortable at rest. HEENT: Conjunctiva and lids normal, wearing a mask. Neck: Supple, no elevated JVP or carotid bruits, no  thyromegaly. Lungs: Clear to auscultation, nonlabored breathing at rest. Cardiac: Regular rate and rhythm, no S3, soft systolic murmur, no pericardial rub. Abdomen: Soft, nontender, bowel sounds present. Extremities: No pitting edema, distal pulses 2+. Skin: Warm and dry. Musculoskeletal: No kyphosis. Neuropsychiatric: Alert and oriented x3, affect grossly appropriate.  ECG:  An ECG dated 04/16/2020 was personally reviewed today and demonstrated:  Sinus rhythm with increased voltage, nonspecific T wave changes.  Recent Labwork: 03/04/2020: TSH 2.05 04/16/2020: ALT 27; AST 24; BUN 19; Creatinine, Ser 0.99; Hemoglobin 12.4; Platelets 255; Potassium 3.8; Sodium 140, high-sensitivity troponin I normal x2    Component Value Date/Time   CHOL 184 08/02/2018 1117   TRIG 138 08/02/2018 1117   HDL 51 08/02/2018 1117   CHOLHDL 3.6 08/02/2018 1117   LDLCALC 107 (H) 08/02/2018 1117    Other Studies Reviewed Today:  Chest x-ray 04/16/2020: FINDINGS: The lungs are clear. Heart size is normal. Aortic atherosclerosis. No pneumothorax or pleural fluid. No acute or focal bony abnormality.  IMPRESSION: No acute disease.  Aortic Atherosclerosis (ICD10-I70.0).  Assessment and Plan:  1.  Longstanding history of fatigue with more recent intermittent chest discomfort and shortness of breath with both typical and atypical features.  Evaluation in the [facility], ER recently revealed overall nonspecific ECG and normal high-sensitivity troponin I levels, chest x-ray without acute findings.  Incidentally noted aortic atherosclerosis was seen.  She has a history of hypertension at baseline.  Plan is to proceed with an echocardiogram for cardiac structural evaluation and a Lexiscan Myoview for ischemic assessment.  2.  Essential hypertension, on lisinopril and HCTZ.  Norvasc was just prescribed by her new PCP.  Her blood pressure in our office is 209 systolic.  I have asked her to get an automatic blood  pressure cuff to check measurements at home prior to starting any new medications.   Medication Adjustments/Labs and Tests Ordered: Current medicines are reviewed at length with the patient today.  Concerns regarding medicines are outlined above.   Tests Ordered: Orders Placed This Encounter  Procedures  . NM Myocar Multi W/Spect W/Wall Motion / EF  . ECHOCARDIOGRAM COMPLETE    Medication Changes: No orders of the defined types were placed in this encounter.   Disposition:  Follow up test results.  Signed, Satira Sark, MD, [facility] 04/21/2020 1:37 PM    [facility] [place], [place], [place] [address] Phone: [phone]; Fax: [phone]

## 2020-04-22 LAB — BASIC METABOLIC PANEL
BUN/Creatinine Ratio: 15 (ref 12–28)
BUN: 11 mg/dL (ref 8–27)
CO2: 20 mmol/L (ref 20–29)
Calcium: 9.4 mg/dL (ref 8.7–10.3)
Chloride: 105 mmol/L (ref 96–106)
Creatinine, Ser: 0.75 mg/dL (ref 0.57–1.00)
Glucose: 118 mg/dL — ABNORMAL HIGH (ref 65–99)
Potassium: 4.1 mmol/L (ref 3.5–5.2)
Sodium: 141 mmol/L (ref 134–144)
eGFR: 78 mL/min/{1.73_m2} (ref >59)

## 2020-04-22 LAB — LIPID PANEL WITH LDL/HDL RATIO
Cholesterol, Total: 186 mg/dL (ref 100–199)
HDL: 51 mg/dL (ref >39)
LDL Chol Calc (NIH): 111 mg/dL — ABNORMAL HIGH (ref 0–99)
LDL/HDL Ratio: 2.2 ratio (ref 0.0–3.2)
Triglycerides: 137 mg/dL (ref 0–149)
VLDL Cholesterol Cal: 24 mg/dL (ref 5–40)

## 2020-04-22 NOTE — Progress Notes (Signed)
Glucose was elevated, so I added an A1c. Her LDL is slightly elevated, so try cutting back on fried/fatty foods.

## 2020-04-23 ENCOUNTER — Encounter (HOSPITAL_COMMUNITY): Payer: Medicare HMO

## 2020-04-23 ENCOUNTER — Ambulatory Visit (HOSPITAL_COMMUNITY): Payer: Medicare HMO

## 2020-04-28 ENCOUNTER — Ambulatory Visit: Payer: Medicare HMO | Admitting: Podiatry

## 2020-04-28 ENCOUNTER — Encounter: Payer: Self-pay | Admitting: Podiatry

## 2020-04-28 ENCOUNTER — Other Ambulatory Visit: Payer: Self-pay

## 2020-04-28 DIAGNOSIS — I739 Peripheral vascular disease, unspecified: Secondary | ICD-10-CM

## 2020-04-28 DIAGNOSIS — M79675 Pain in left toe(s): Secondary | ICD-10-CM | POA: Diagnosis not present

## 2020-04-28 DIAGNOSIS — M79674 Pain in right toe(s): Secondary | ICD-10-CM

## 2020-04-28 DIAGNOSIS — B351 Tinea unguium: Secondary | ICD-10-CM | POA: Diagnosis not present

## 2020-04-29 ENCOUNTER — Encounter: Payer: Self-pay | Admitting: Nurse Practitioner

## 2020-04-29 ENCOUNTER — Ambulatory Visit (INDEPENDENT_AMBULATORY_CARE_PROVIDER_SITE_OTHER): Payer: Medicare HMO | Admitting: Nurse Practitioner

## 2020-04-29 DIAGNOSIS — R911 Solitary pulmonary nodule: Secondary | ICD-10-CM | POA: Diagnosis not present

## 2020-04-29 DIAGNOSIS — G44201 Tension-type headache, unspecified, intractable: Secondary | ICD-10-CM

## 2020-04-29 DIAGNOSIS — I10 Essential (primary) hypertension: Secondary | ICD-10-CM | POA: Diagnosis not present

## 2020-04-29 DIAGNOSIS — R5383 Other fatigue: Secondary | ICD-10-CM

## 2020-04-29 NOTE — Patient Instructions (Signed)
We will meet back up in May because that will be 3 months since you had labs with your previous PCP.  I sent in a referral to neurology for your headaches, and we will try to get a follow-up CT of your lungs.  Please have fasting labs drawn 2-3 days prior to your appointment so we can discuss the results during your office visit.

## 2020-04-29 NOTE — Progress Notes (Signed)
Acute Office Visit  Subjective:    Patient ID: Nancy Schroeder, female    DOB: 1933-11-25, 87 y.o.   MRN: [id number]  Chief Complaint  Patient presents with  . Hypertension    HPI Patient is in today for BP check.  At last OV, her BP was 185/77 and she was having SOB with exertion. She saw cardiology on the same day as her last OV, and at that time her BP was 828 systolic, so they advised her not to start amlodipine unless her home BPs were elevated. She states that she has not picked up a BP cuff, so she doesn't check her BP at home.  She currently takes lisinopril and HCTZ.   She states that she had cataracts removed, and her BP was extremely elevated so they didn't do surgery.  She states that she has been having severe headaches. She is unsure what her BP is when her head is hurting.  Past Medical History:  Diagnosis Date  . Anxiety   . Depression   . Essential hypertension   . H/O back injury 2016    Past Surgical History:  Procedure Laterality Date  . ABDOMINAL HYSTERECTOMY     total  . CATARACT EXTRACTION, BILATERAL  01/2020  . CHOLECYSTECTOMY    . NECK SURGERY      Family History  Problem Relation Age of Onset  . Diabetes Mother   . Diabetes Father   . Diabetes Sister     Social History   Socioeconomic History  . Marital status: Widowed    Spouse name: Not on file  . Number of children: 3  . Years of education: Not on file  . Highest education level: 8th grade  Occupational History  . Occupation: retired  Tobacco Use  . Smoking status: Never Smoker  . Smokeless tobacco: Never Used  Vaping Use  . Vaping Use: Never used  Substance and Sexual Activity  . Alcohol use: Yes    Comment: 1-2 drinks per 6 months  . Drug use: Never  . Sexual activity: Not Currently    Birth control/protection: Surgical, Post-menopausal  Other Topics Concern  . Not on file  Social History Narrative   Live with [place] (middle)   Has 3 sons   Oldest lives in  [place] lives in [place] (with 1 grandchild)   3 cats: lily-16 , troubles, whiskers (boys-5 years)      Enjoyed sewing, and knitting, but back injury made this worse. Read, tv, putt around, likes astrology      Diet: Eats what she wants. Enjoys veggies and fruits   Caffeine: 1-2 cups of coffee, iced tea   Water: 6-8 cups daily       Wear seatbelt   Does not wear sunscreen   Smoke and carbon monoxide detectors         Social Determinants of Health   Financial Resource Strain: Not on file  Food Insecurity: Not on file  Transportation Needs: Not on file  Physical Activity: Not on file  Stress: Not on file  Social Connections: Not on file  Intimate Partner Violence: Not on file    Outpatient Medications Prior to Visit  Medication Sig Dispense Refill  . amLODipine (NORVASC) 5 MG tablet Take 1 tablet (5 mg total) by mouth daily. 90 tablet 3  . aspirin EC 81 MG tablet Take 81 mg by mouth daily. Swallow whole.    . busPIRone (BUSPAR) 10 MG tablet Take 1 tablet (10  mg total) by mouth daily as needed (for mood). 180 tablet 1  . diphenhydrAMINE (BENADRYL) 25 mg capsule Take 25 mg by mouth at bedtime as needed.    . hydrochlorothiazide (HYDRODIURIL) 25 MG tablet TAKE 1 TABLET(25 MG) BY MOUTH DAILY 90 tablet 1  . ibuprofen (ADVIL) 800 MG tablet TAKE 1 TABLET(800 MG) BY MOUTH DAILY AS NEEDED 30 tablet 2  . lisinopril (ZESTRIL) 40 MG tablet TAKE 1 TABLET(40 MG) BY MOUTH DAILY 90 tablet 1  . NP THYROID 30 MG tablet TAKE 1 TABLET(30 MG) BY MOUTH DAILY BEFORE BREAKFAST 30 tablet 3  . Psyllium (METAMUCIL FIBER PO) Take 2 capsules by mouth 3 (three) times daily.     No facility-administered medications prior to visit.    Allergies  Allergen Reactions  . Influenza Vaccines Swelling    Review of Systems  Constitutional: Negative.   Respiratory: Positive for shortness of breath. Negative for cough, chest tightness and wheezing.   Cardiovascular: Negative.   Neurological:  Positive for headaches.  Psychiatric/Behavioral: Negative.        Objective:    Physical Exam Constitutional:      Appearance: She is obese.  Cardiovascular:     Rate and Rhythm: Normal rate and regular rhythm.     Pulses: Normal pulses.     Heart sounds: Normal heart sounds.  Pulmonary:     Effort: Pulmonary effort is normal.     Breath sounds: Normal breath sounds.  Neurological:     General: No focal deficit present.     Mental Status: She is alert and oriented to person, place, and time.     Cranial Nerves: No cranial nerve deficit.     Motor: No weakness.  Psychiatric:        Mood and Affect: Mood normal.        Behavior: Behavior normal.        Thought Content: Thought content normal.        Judgment: Judgment normal.     BP (!) 150/68   Pulse 74   Temp 97.8 F (36.6 C)   Resp 20   Ht 5' (1.524 m)   Wt 170 lb (77.1 kg)   SpO2 96%   BMI 33.20 kg/m  Wt Readings from Last 3 Encounters:  04/29/20 170 lb (77.1 kg)  04/21/20 171 lb 9.6 oz (77.8 kg)  04/21/20 171 lb (77.6 kg)    Health Maintenance Due  Topic Date Due  . TETANUS/TDAP  Never done  . DEXA SCAN  Never done  . PNA vac Low Risk Adult (2 of 2 - PPSV23) 06/02/2018    There are no preventive care reminders to display for this patient.   Lab Results  Component Value Date   TSH 2.05 03/04/2020   Lab Results  Component Value Date   WBC 7.4 04/16/2020   HGB 12.4 04/16/2020   HCT 37.5 04/16/2020   MCV 94.5 04/16/2020   PLT 255 04/16/2020   Lab Results  Component Value Date   NA 141 04/21/2020   K 4.1 04/21/2020   CO2 20 04/21/2020   GLUCOSE 118 (H) 04/21/2020   BUN 11 04/21/2020   CREATININE 0.75 04/21/2020   BILITOT 0.5 04/16/2020   ALKPHOS 52 04/16/2020   AST 24 04/16/2020   ALT 27 04/16/2020   PROT 6.3 (L) 04/16/2020   ALBUMIN 3.4 (L) 04/16/2020   CALCIUM 9.4 04/21/2020   ANIONGAP 10 04/16/2020   Lab Results  Component Value Date   CHOL 186  04/21/2020   Lab Results   Component Value Date   HDL 51 04/21/2020   Lab Results  Component Value Date   LDLCALC 111 (H) 04/21/2020   Lab Results  Component Value Date   TRIG 137 04/21/2020   Lab Results  Component Value Date   CHOLHDL 3.6 08/02/2018   Lab Results  Component Value Date   HGBA1C 5.5 08/02/2018       Assessment & Plan:   Problem List Items Addressed This Visit      Cardiovascular and Mediastinum   Essential hypertension    -she will start amlodipine in evening -we discussed checking BP in AM and/or in PM several times per week as well as when she has headaches      Relevant Orders   CBC with Differential/Platelet   CMP14+EGFR   Lipid Panel With LDL/HDL Ratio     Other   Headache    -Rx. Ibuprofen, use sparingly with ASA -referral to Neuro      Relevant Orders   Ambulatory referral to Neurology   Pulmonary nodule    -on previous CT scan -will get f/u CT scan      Relevant Orders   CT Chest Wo Contrast   Fatigue    -had labs with previous PCP on 03/04/20 -will check full set of labs in May including thyroid labs and Vit D      Relevant Orders   TSH + free T4   VITAMIN D 25 Hydroxy (Vit-D Deficiency, Fractures)       No orders of the defined types were placed in this encounter.    Noreene Larsson, NP

## 2020-04-29 NOTE — Assessment & Plan Note (Signed)
-  she will start amlodipine in evening -we discussed checking BP in AM and/or in PM several times per week as well as when she has headaches

## 2020-04-29 NOTE — Assessment & Plan Note (Signed)
-  on previous CT scan -will get f/u CT scan

## 2020-04-29 NOTE — Assessment & Plan Note (Signed)
-  Rx. Ibuprofen, use sparingly with ASA -referral to Neuro

## 2020-04-29 NOTE — Assessment & Plan Note (Signed)
-  had labs with previous PCP on 03/04/20 -will check full set of labs in May including thyroid labs and Vit D

## 2020-05-01 NOTE — Progress Notes (Signed)
  Subjective:  Patient ID: Nancy Schroeder, female    DOB: 10-Nov-1933,  MRN: [id number]  87 y.o. female presents with for at risk foot care. Patient has h/o PAD and painful thick toenails that are difficult to trim. Also has h/o neuropathy. Pain interferes with ambulation. Aggravating factors include wearing enclosed shoe gear. Pain is relieved with periodic professional debridement.  She voices no new pedal concerns on today's visit. PCP is Noreene Larsson, NP. Last visit was one week ago.  Review of Systems: Negative except as noted in the HPI.   Allergies  Allergen Reactions  . Influenza Vaccines Swelling   Objective:   Constitutional Pt is a pleasant 87 y.o. Caucasian female WD, WN in NAD.Marland Kitchen AAO x 3.   Vascular Capillary fill time to digits delayed b/l lower extremities. Nonpalpable DP pulse(s) b/l lower extremities. Nonpalpable PT pulse(s) b/l lower extremities. Pedal hair absent. Lower extremity skin temperature gradient within normal limits. No pain with calf compression b/l. No edema noted b/l lower extremities. No ischemia or gangrene noted b/l lower extremities. No cyanosis or clubbing noted.  Neurologic Normal speech. Oriented to person, place, and time. Protective sensation intact 5/5 intact bilaterally with 10g monofilament b/l. Vibratory sensation intact b/l. Proprioception intact bilaterally. Clonus negative b/l.  Dermatologic Pedal skin with normal turgor, texture and tone bilaterally. No open wounds bilaterally. No interdigital macerations bilaterally. Toenails 1-5 b/l elongated, discolored, dystrophic, thickened, crumbly with subungual debris and tenderness to dorsal palpation. No hyperkeratotic nor porokeratotic lesions present on today's visit.  Orthopedic: Normal muscle strength 5/5 to all lower extremity muscle groups bilaterally. No pain crepitus or joint limitation noted with ROM b/l. Hallux valgus with bunion deformity noted b/l lower extremities. Hammertoes noted to the  2-5 bilaterally. Pain noted on palpation medial tubercle left heel. Pain on palpation medial tubercle right heel.   Radiographs: None Assessment:   1. Pain due to onychomycosis of toenails of both feet   2. Peripheral vascular disease ([place])    Plan:  Patient was evaluated and treated and all questions answered.  Onychomycosis with pain -Nails palliatively debridement as below. -Educated on self-care  Procedure: Nail Debridement Rationale: Pain Type of Debridement: manual, sharp debridement. Instrumentation: Nail nipper, rotary burr. Number of Nails: 10 -No new findings. No new orders. -Continue diabetic foot care principles. -Toenails 1-5 b/l were debrided in length and girth with sterile nail nippers and dremel without iatrogenic bleeding.  -Patient to report any pedal injuries to medical professional immediately. -Patient to continue soft, supportive shoe gear daily. -Patient/POA to call should there be question/concern in the interim.  Return in about 3 months (around 07/29/2020).  Marzetta Board, DPM

## 2020-05-06 ENCOUNTER — Telehealth: Payer: Self-pay

## 2020-05-06 ENCOUNTER — Other Ambulatory Visit: Payer: Self-pay | Admitting: Nurse Practitioner

## 2020-05-06 NOTE — Progress Notes (Signed)
-  Talked to radiologist Dr. Dene Gentry at [phone] for prior authorization -CT chest was denied d/t 2+ year stability of her pulmonary nodule; if she has new symptoms, we can try PA again

## 2020-05-06 NOTE — Telephone Encounter (Signed)
Dr. Dene Gentry needs peer to peer review with you in regards to CT of chest for this pt. Case number is [id number].

## 2020-05-06 NOTE — Telephone Encounter (Signed)
[phone]  

## 2020-05-06 NOTE — Telephone Encounter (Signed)
What is their phone number?? 

## 2020-05-09 ENCOUNTER — Telehealth: Payer: Self-pay | Admitting: Cardiology

## 2020-05-09 ENCOUNTER — Other Ambulatory Visit: Payer: Self-pay

## 2020-05-09 ENCOUNTER — Telehealth: Payer: Self-pay | Admitting: *Deleted

## 2020-05-09 ENCOUNTER — Ambulatory Visit (HOSPITAL_COMMUNITY)
Admission: RE | Admit: 2020-05-09 | Discharge: 2020-05-09 | Disposition: A | Discharge status: Home / Self Care | Payer: Medicare HMO | Source: Ambulatory Visit | Attending: Cardiology | Admitting: Cardiology

## 2020-05-09 DIAGNOSIS — R0602 Shortness of breath: Secondary | ICD-10-CM | POA: Diagnosis not present

## 2020-05-09 DIAGNOSIS — R079 Chest pain, unspecified: Secondary | ICD-10-CM | POA: Diagnosis not present

## 2020-05-09 LAB — ECHOCARDIOGRAM COMPLETE
AR max vel: 1.84 cm2
AV Area VTI: 1.91 cm2
AV Area mean vel: 1.76 cm2
AV Mean grad: 4.6 mmHg
AV Peak grad: 9.6 mmHg
Ao pk vel: 1.55 m/s
Area-P 1/2: 2.4 cm2
P 1/2 time: 386 msec
S' Lateral: 2.6 cm

## 2020-05-09 NOTE — Telephone Encounter (Signed)
-----   Message from Satira Sark, MD sent at 05/09/2020 12:10 PM EDT ----- Results reviewed.  LVEF is normal range at 60 to 65% with mild diastolic dysfunction.  Ascending aorta is mildly dilated which would not be expected to be causing any symptoms.  There is also moderate aortic regurgitation.  More evidence for continued blood pressure control.  We will follow up on stress test results when available.

## 2020-05-09 NOTE — Telephone Encounter (Signed)
Pre-cert Verification for the following procedure     LEXISCAN   DATE:05/19/2020  LOCATION: [facility]

## 2020-05-09 NOTE — Progress Notes (Signed)
*  PRELIMINARY RESULTS* Echocardiogram 2D Echocardiogram has been performed.  Samuel Germany 05/09/2020, 10:06 AM

## 2020-05-09 NOTE — Telephone Encounter (Signed)
Patient informed. Copy sent to PCP °

## 2020-05-14 ENCOUNTER — Telehealth (INDEPENDENT_AMBULATORY_CARE_PROVIDER_SITE_OTHER): Payer: Medicare HMO | Admitting: Internal Medicine

## 2020-05-14 ENCOUNTER — Other Ambulatory Visit (INDEPENDENT_AMBULATORY_CARE_PROVIDER_SITE_OTHER): Payer: Self-pay | Admitting: Nurse Practitioner

## 2020-05-14 ENCOUNTER — Encounter: Payer: Self-pay | Admitting: Internal Medicine

## 2020-05-14 VITALS — Ht 60.0 in | Wt 171.0 lb

## 2020-05-14 DIAGNOSIS — R1013 Epigastric pain: Secondary | ICD-10-CM

## 2020-05-14 DIAGNOSIS — E038 Other specified hypothyroidism: Secondary | ICD-10-CM

## 2020-05-14 MED ORDER — PANTOPRAZOLE SODIUM 40 MG PO TBEC: 40.0000 mg | DELAYED_RELEASE_TABLET | Freq: Every day | ORAL | 3 refills | Status: DC

## 2020-05-14 NOTE — Patient Instructions (Signed)
Please take Pantoprazole as prescribed.  Avoid hot, spicy and oily food.  Please stop taking NP thyroid.

## 2020-05-14 NOTE — Progress Notes (Signed)
Virtual Visit via Telephone Note   This visit type was conducted due to national recommendations for restrictions regarding the COVID-19 Pandemic (e.g. social distancing) in an effort to limit this patient's exposure and mitigate transmission in our community.  Due to her co-morbid illnesses, this patient is at least at moderate risk for complications without adequate follow up.  This format is felt to be most appropriate for this patient at this time.  The patient did not have access to video technology/had technical difficulties with video requiring transitioning to audio format only (telephone).  All issues noted in this document were discussed and addressed.  No physical exam could be performed with this format.  Evaluation Performed:  Follow-up visit  Date:  05/14/2020   ID:  Nancy Schroeder, DOB November 22, 1933, MRN [id number]  Patient Location: Home Provider Location: Office/Clinic  Participants: Patient Location of Patient: Home Location of Provider: Telehealth Consent was obtain for visit to be over via telehealth. I verified that I am speaking with the correct person using two identifiers.  PCP:  Noreene Larsson, NP   Chief Complaint:  Epigastric pain  History of Present Illness:    Nancy Schroeder is a 87 y.o. female who has a televisit for c/o chronic epigastric pain, which has been getting worse for last 1 week. She denies any fever, chills, nausea, vomiting, dysphagia, odynophagia, melena or hematochezia. She takes Metamucil for constipation. She takes Ibuprofen for back pain/joint pain.  The patient does not have symptoms concerning for COVID-19 infection (fever, chills, cough, or new shortness of breath).   Past Medical, Surgical, Social History, Allergies, and Medications have been Reviewed.  Past Medical History:  Diagnosis Date  . Anxiety   . Depression   . Essential hypertension   . H/O back injury 2016   Past Surgical History:  Procedure Laterality Date   . ABDOMINAL HYSTERECTOMY     total  . CATARACT EXTRACTION, BILATERAL  01/2020  . CHOLECYSTECTOMY    . NECK SURGERY       Current Meds  Medication Sig  . amLODipine (NORVASC) 5 MG tablet Take 1 tablet (5 mg total) by mouth daily.  Marland Kitchen aspirin EC 81 MG tablet Take 81 mg by mouth daily. Swallow whole.  . busPIRone (BUSPAR) 10 MG tablet Take 1 tablet (10 mg total) by mouth daily as needed (for mood).  . diphenhydrAMINE (BENADRYL) 25 mg capsule Take 25 mg by mouth at bedtime as needed.  . hydrochlorothiazide (HYDRODIURIL) 25 MG tablet TAKE 1 TABLET(25 MG) BY MOUTH DAILY  . ibuprofen (ADVIL) 800 MG tablet TAKE 1 TABLET(800 MG) BY MOUTH DAILY AS NEEDED  . lisinopril (ZESTRIL) 40 MG tablet TAKE 1 TABLET(40 MG) BY MOUTH DAILY  . NP THYROID 30 MG tablet TAKE 1 TABLET(30 MG) BY MOUTH DAILY BEFORE BREAKFAST  . pantoprazole (PROTONIX) 40 MG tablet Take 1 tablet (40 mg total) by mouth daily.  . Psyllium (METAMUCIL FIBER PO) Take 2 capsules by mouth 3 (three) times daily.     Allergies:   Influenza vaccines   ROS:   Please see the history of present illness.     All other systems reviewed and are negative.   Labs/Other Tests and Data Reviewed:    Recent Labs: 03/04/2020: TSH 2.05 04/16/2020: ALT 27; Hemoglobin 12.4; Platelets 255 04/21/2020: BUN 11; Creatinine, Ser 0.75; Potassium 4.1; Sodium 141   Recent Lipid Panel Lab Results  Component Value Date/Time   CHOL 186 04/21/2020 10:36 AM   TRIG 137 04/21/2020  10:36 AM   HDL 51 04/21/2020 10:36 AM   CHOLHDL 3.6 08/02/2018 11:17 AM   LDLCALC 111 (H) 04/21/2020 10:36 AM   LDLCALC 107 (H) 08/02/2018 11:17 AM    Wt Readings from Last 3 Encounters:  05/14/20 171 lb (77.6 kg)  04/29/20 170 lb (77.1 kg)  04/21/20 171 lb 9.6 oz (77.8 kg)      ASSESSMENT & PLAN:    Epigastric pain Could be due to GERD and/or gastritis Has been on PRN Ibuprofen Advised to take Tylenol instead Will give trial of PPI If persistent pain, obtain imaging She  has had cholecystectomy.  Subclinical Hypothyroidism On NP thyroid Advised to hold it until we check TSH in the next visit  Time:   Today, I have spent 15 minutes reviewing the chart, including problem list, medications, and with the patient with telehealth technology discussing the above problems.   Medication Adjustments/Labs and Tests Ordered: Current medicines are reviewed at length with the patient today.  Concerns regarding medicines are outlined above.   Tests Ordered: No orders of the defined types were placed in this encounter.   Medication Changes: Meds ordered this encounter  Medications  . pantoprazole (PROTONIX) 40 MG tablet    Sig: Take 1 tablet (40 mg total) by mouth daily.    Dispense:  30 tablet    Refill:  3     Note: This dictation was prepared with Dragon dictation along with smaller phrase technology. Similar sounding words can be transcribed inadequately or may not be corrected upon review. Any transcriptional errors that result from this process are unintentional.      Disposition:  Follow up  Signed, Lindell Spar, MD  05/14/2020 9:01 AM     [facility]

## 2020-05-17 ENCOUNTER — Encounter: Payer: Self-pay | Admitting: Emergency Medicine

## 2020-05-17 ENCOUNTER — Ambulatory Visit
Admission: EM | Admit: 2020-05-17 | Discharge: 2020-05-17 | Disposition: A | Discharge status: Home / Self Care | Payer: Medicare HMO | Attending: Emergency Medicine | Admitting: Emergency Medicine

## 2020-05-17 ENCOUNTER — Other Ambulatory Visit: Payer: Self-pay

## 2020-05-17 DIAGNOSIS — M79672 Pain in left foot: Secondary | ICD-10-CM | POA: Diagnosis not present

## 2020-05-17 DIAGNOSIS — R1011 Right upper quadrant pain: Secondary | ICD-10-CM | POA: Diagnosis not present

## 2020-05-17 MED ORDER — PREDNISONE 20 MG PO TABS: 20.0000 mg | ORAL_TABLET | Freq: Two times a day (BID) | ORAL | 0 refills | Status: AC

## 2020-05-17 NOTE — Discharge Instructions (Signed)
Continue conservative management of rest, ice, and elevation Will trial a short course of steroids. Follow up with PCP if symptoms persist Return or go to the ER if you have any new or worsening symptoms (fever, chills, chest pain, redness, swelling, deformity, bruising etc...)

## 2020-05-17 NOTE — ED Provider Notes (Signed)
[facility]   [id number] 05/17/20 Arrival Time: 2505  CC: foot PAIN  SUBJECTIVE: History from: patient. Nancy Schroeder is a 87 y.o. female complains of LT foot pain x few days.  Denies a precipitating event or specific injury.  Hx significant for heel spurs, and underwent surgery to have removed in the 1990s. Had nerve damage following procedure.  Localizes the pain to the bottom of foot.  Describes the pain as constant and burning/ pins and needles in character.  Has tried OTC medications without relief.  Denies aggravating factors.  Reports similar symptoms in the past seen in the ED and treated with morphine.  Denies fever, chills, erythema, ecchymosis, effusion, weakness.  Patient also mentions RT side pain x 6 months.  Has been seen by PCP and ED for similar.  Describes as burning.    ROS: As per HPI.  All other pertinent ROS negative.     Past Medical History:  Diagnosis Date  . Anxiety   . Depression   . Essential hypertension   . H/O back injury 2016   Past Surgical History:  Procedure Laterality Date  . ABDOMINAL HYSTERECTOMY     total  . CATARACT EXTRACTION, BILATERAL  01/2020  . CHOLECYSTECTOMY    . NECK SURGERY     Allergies  Allergen Reactions  . Influenza Vaccines Swelling   No current facility-administered medications on file prior to encounter.   Current Outpatient Medications on File Prior to Encounter  Medication Sig Dispense Refill  . amLODipine (NORVASC) 5 MG tablet Take 1 tablet (5 mg total) by mouth daily. 90 tablet 3  . aspirin EC 81 MG tablet Take 81 mg by mouth daily. Swallow whole.    . busPIRone (BUSPAR) 10 MG tablet Take 1 tablet (10 mg total) by mouth daily as needed (for mood). 180 tablet 1  . diphenhydrAMINE (BENADRYL) 25 mg capsule Take 25 mg by mouth at bedtime as needed.    . hydrochlorothiazide (HYDRODIURIL) 25 MG tablet TAKE 1 TABLET(25 MG) BY MOUTH DAILY 90 tablet 1  . ibuprofen (ADVIL) 800 MG tablet TAKE 1 TABLET(800 MG)  BY MOUTH DAILY AS NEEDED 30 tablet 2  . lisinopril (ZESTRIL) 40 MG tablet TAKE 1 TABLET(40 MG) BY MOUTH DAILY 90 tablet 1  . NP THYROID 30 MG tablet TAKE 1 TABLET(30 MG) BY MOUTH DAILY BEFORE BREAKFAST 30 tablet 3  . pantoprazole (PROTONIX) 40 MG tablet Take 1 tablet (40 mg total) by mouth daily. 30 tablet 3  . Psyllium (METAMUCIL FIBER PO) Take 2 capsules by mouth 3 (three) times daily.     Social History   Socioeconomic History  . Marital status: Widowed    Spouse name: Not on file  . Number of children: 3  . Years of education: Not on file  . Highest education level: 8th grade  Occupational History  . Occupation: retired  Tobacco Use  . Smoking status: Never Smoker  . Smokeless tobacco: Never Used  Vaping Use  . Vaping Use: Never used  Substance and Sexual Activity  . Alcohol use: Yes    Comment: 1-2 drinks per 6 months  . Drug use: Never  . Sexual activity: Not Currently    Birth control/protection: Surgical, Post-menopausal  Other Topics Concern  . Not on file  Social History Narrative   Live with [place] (middle)   Has 3 sons   Oldest lives in [place] lives in [place] (with 1 grandchild)   3 cats: lily-16 , troubles, whiskers (  boys-5 years)      Enjoyed sewing, and knitting, but back injury made this worse. Read, tv, putt around, likes astrology      Diet: Eats what she wants. Enjoys veggies and fruits   Caffeine: 1-2 cups of coffee, iced tea   Water: 6-8 cups daily       Wear seatbelt   Does not wear sunscreen   Smoke and carbon monoxide detectors         Social Determinants of Health   Financial Resource Strain: Not on file  Food Insecurity: Not on file  Transportation Needs: Not on file  Physical Activity: Not on file  Stress: Not on file  Social Connections: Not on file  Intimate Partner Violence: Not on file   Family History  Problem Relation Age of Onset  . Diabetes Mother   . Diabetes Father   . Diabetes Sister      OBJECTIVE:  Vitals:   05/17/20 1109  BP: 117/81  Pulse: 97  Resp: 16  Temp: 97.6 F (36.4 C)  TempSrc: Tympanic  SpO2: 98%    General appearance: ALERT; in no acute distress.  Head: NCAT Lungs: Normal respiratory effort CV: dorsalis pedis pulse 2+ Musculoskeletal: LT Foot Inspection: Skin warm, dry, clear and intact without obvious erythema, effusion, or ecchymosis.  Palpation: diffusely TTP over plantar aspect of LT foot Skin: warm and dry Neurologic: Ambulates with a cane Psychological: alert and cooperative; normal mood and affect  ASSESSMENT & PLAN:  1. Left foot pain   2. Abdominal discomfort in right upper quadrant    Meds ordered this encounter  Medications  . predniSONE (DELTASONE) 20 MG tablet    Sig: Take 1 tablet (20 mg total) by mouth 2 (two) times daily with a meal for 5 days.    Dispense:  10 tablet    Refill:  0    Order Specific Question:   Supervising Provider    Answer:   Raylene Everts [[id number]]   Cannot determine cause of chronic RT sided abdominal pain in urgent care setting. Patient maybe would benefit from further work-up with PCP or in the ED.  Offered patient further evaluation and management in the ED.  Patient declines at this time and would like to try outpatient therapy first.  Aware of the risk associated with this decision including missed diagnosis, organ damage, organ failure, and/or death.  Patient aware and in agreement.     Steroid shot given in office Continue conservative management of rest, ice, and elevation Will trial a short course of steroids. Follow up with PCP if symptoms persist Return or go to the ER if you have any new or worsening symptoms (fever, chills, chest pain, redness, swelling, deformity, bruising etc...)    Reviewed expectations re: course of current medical issues. Questions answered. Outlined signs and symptoms indicating need for more acute intervention. Patient verbalized understanding. After  Visit Summary given.    Lestine Box, PA-C 05/17/20 1202

## 2020-05-17 NOTE — ED Triage Notes (Signed)
Left foot pain, hx of having spurs removed and states she received nerve damage when the spurs were removed.  Hard to walk on that foot today.    Right side pain that feels sore and a burning sensation.  Pain x a couple of weeks.

## 2020-05-19 ENCOUNTER — Encounter (HOSPITAL_COMMUNITY): Payer: Self-pay

## 2020-05-19 ENCOUNTER — Encounter (HOSPITAL_COMMUNITY)
Admission: RE | Admit: 2020-05-19 | Discharge: 2020-05-19 | Disposition: A | Discharge status: Home / Self Care | Payer: Medicare HMO | Source: Ambulatory Visit | Attending: Cardiology | Admitting: Cardiology

## 2020-05-19 ENCOUNTER — Encounter (HOSPITAL_BASED_OUTPATIENT_CLINIC_OR_DEPARTMENT_OTHER)
Admission: RE | Admit: 2020-05-19 | Discharge: 2020-05-19 | Disposition: A | Discharge status: Home / Self Care | Payer: Medicare HMO | Source: Ambulatory Visit | Attending: Cardiology | Admitting: Cardiology

## 2020-05-19 DIAGNOSIS — R079 Chest pain, unspecified: Secondary | ICD-10-CM

## 2020-05-19 DIAGNOSIS — R0602 Shortness of breath: Secondary | ICD-10-CM

## 2020-05-19 LAB — NM MYOCAR MULTI W/SPECT W/WALL MOTION / EF
LV dias vol: 70 mL (ref 46–106)
LV sys vol: 21 mL
Peak HR: 75 {beats}/min
RATE: 0.35
Rest HR: 57 {beats}/min
SDS: 3
SRS: 0
SSS: 3
TID: 0.78

## 2020-05-19 MED ORDER — TECHNETIUM TC 99M TETROFOSMIN IV KIT
10.0000 | PACK | Freq: Once | INTRAVENOUS | Status: AC | PRN
  Administered 2020-05-19: 11 via INTRAVENOUS

## 2020-05-19 MED ORDER — SODIUM CHLORIDE FLUSH 0.9 % IV SOLN
INTRAVENOUS | Status: AC
  Administered 2020-05-19: 10 mL via INTRAVENOUS
  Filled 2020-05-19: qty 10

## 2020-05-19 MED ORDER — REGADENOSON 0.4 MG/5ML IV SOLN
INTRAVENOUS | Status: AC
  Administered 2020-05-19: 0.4 mg via INTRAVENOUS
  Filled 2020-05-19: qty 5

## 2020-05-19 MED ORDER — TECHNETIUM TC 99M TETROFOSMIN IV KIT
30.0000 | PACK | Freq: Once | INTRAVENOUS | Status: AC | PRN
  Administered 2020-05-19: 29 via INTRAVENOUS

## 2020-05-20 ENCOUNTER — Telehealth: Payer: Self-pay | Admitting: *Deleted

## 2020-05-20 NOTE — Telephone Encounter (Signed)
Patient informed. Copy sent to PCP °

## 2020-05-20 NOTE — Telephone Encounter (Signed)
-----   Message from Satira Sark, MD sent at 05/19/2020  2:19 PM EDT ----- Results reviewed.  Please let her know that the stress test did not show any ischemia to indicate obstructive CAD as cause of her symptoms.  Also normal LVEF greater than 65%.  Low risk study overall.  No further cardiac testing planned at this time.  Recommend that she keep regular follow-up with PCP.

## 2020-05-23 ENCOUNTER — Telehealth (INDEPENDENT_AMBULATORY_CARE_PROVIDER_SITE_OTHER): Payer: Self-pay | Admitting: Nurse Practitioner

## 2020-05-23 NOTE — Telephone Encounter (Signed)
Please see if patient can do virtual on 05/29/20.

## 2020-05-23 NOTE — Telephone Encounter (Signed)
Left message for patient to call back and schedule Medicare Annual Wellness Visit (AWV) either virtually or in office.   awvs 02/01/09 per [facility]   please schedule at anytime with  health coach  This should be a 45 minute visit.

## 2020-05-28 ENCOUNTER — Ambulatory Visit: Payer: Medicare HMO | Admitting: Cardiology

## 2020-06-04 ENCOUNTER — Ambulatory Visit (INDEPENDENT_AMBULATORY_CARE_PROVIDER_SITE_OTHER): Payer: Medicare HMO | Admitting: Nurse Practitioner

## 2020-06-05 ENCOUNTER — Ambulatory Visit (INDEPENDENT_AMBULATORY_CARE_PROVIDER_SITE_OTHER): Payer: Medicare HMO | Admitting: Nurse Practitioner

## 2020-06-16 ENCOUNTER — Ambulatory Visit: Payer: Medicare HMO | Admitting: Nurse Practitioner

## 2020-07-10 ENCOUNTER — Other Ambulatory Visit (INDEPENDENT_AMBULATORY_CARE_PROVIDER_SITE_OTHER): Payer: Self-pay | Admitting: Internal Medicine

## 2020-07-10 ENCOUNTER — Ambulatory Visit: Payer: Medicare HMO | Admitting: Neurology

## 2020-08-13 ENCOUNTER — Encounter: Payer: Self-pay | Admitting: Podiatry

## 2020-08-13 ENCOUNTER — Ambulatory Visit: Payer: Medicare HMO | Admitting: Podiatry

## 2020-08-13 ENCOUNTER — Other Ambulatory Visit: Payer: Self-pay

## 2020-08-13 DIAGNOSIS — B351 Tinea unguium: Secondary | ICD-10-CM | POA: Diagnosis not present

## 2020-08-13 DIAGNOSIS — M79675 Pain in left toe(s): Secondary | ICD-10-CM | POA: Diagnosis not present

## 2020-08-13 DIAGNOSIS — M79674 Pain in right toe(s): Secondary | ICD-10-CM

## 2020-08-13 DIAGNOSIS — I739 Peripheral vascular disease, unspecified: Secondary | ICD-10-CM | POA: Diagnosis not present

## 2020-08-16 NOTE — Progress Notes (Signed)
Subjective: Nancy Schroeder is a pleasant 87 y.o. female patient seen today with h/o PAD for at risk foot care. She is seen for painful thick toenails that are difficult to trim. Pain interferes with ambulation. Aggravating factors include wearing enclosed shoe gear. Pain is relieved with periodic professional debridement.  She voices no new pedal problems on today's visit.  Allergies  Allergen Reactions   Influenza Vaccines Swelling   Objective: Physical Exam  General: Nancy Schroeder is a pleasant 87 y.o. Caucasian female, in NAD. AAO x 3.   Vascular:  Capillary fill time to digits delayed b/l lower extremities. Nonpalpable pedal pulse(s) b/l lower extremities. Pedal hair absent. Lower extremity skin temperature gradient within normal limits. No pain with calf compression b/l. No edema noted b/l lower extremities.  Dermatological:  Pedal skin with normal turgor, texture and tone b/l lower extremities. No open wounds b/l lower extremities. No interdigital macerations b/l lower extremities. Toenails 1-5 b/l elongated, discolored, dystrophic, thickened, crumbly with subungual debris and tenderness to dorsal palpation.  Musculoskeletal:  Normal muscle strength 5/5 to all lower extremity muscle groups bilaterally. No pain crepitus or joint limitation noted with ROM b/l. Hallux valgus with bunion deformity noted b/l lower extremities. Hammertoe(s) noted to the 2-5 bilaterally.  Neurological:  Protective sensation intact 5/5 intact bilaterally with 10g monofilament b/l. Vibratory sensation intact b/l.  Assessment and Plan:  1. Pain due to onychomycosis of toenails of both feet   2. Peripheral vascular disease ([place])     -Examined patient. -Patient to continue soft, supportive shoe gear daily. -Toenails 1-5 b/l were debrided in length and girth with sterile nail nippers and dremel without iatrogenic bleeding.  -Patient to report any pedal injuries to medical professional  immediately. -Patient/POA to call should there be question/concern in the interim.  Return in about 3 months (around 11/13/2020).  Marzetta Board, DPM

## 2020-08-29 ENCOUNTER — Other Ambulatory Visit: Payer: Self-pay

## 2020-08-29 ENCOUNTER — Ambulatory Visit (INDEPENDENT_AMBULATORY_CARE_PROVIDER_SITE_OTHER): Payer: Medicare HMO

## 2020-08-29 DIAGNOSIS — Z0000 Encounter for general adult medical examination without abnormal findings: Secondary | ICD-10-CM

## 2020-08-29 NOTE — Progress Notes (Signed)
Subjective:   Nancy Schroeder is a 87 y.o. female who presents for Medicare Annual (Subsequent) preventive examination.      Objective:    There were no vitals filed for this visit. There is no height or weight on file to calculate BMI.  Advanced Directives 04/16/2020 08/14/2019  Does Patient Have a Medical Advance Directive? No No    Current Medications (verified) Outpatient Encounter Medications as of 08/29/2020  Medication Sig   amLODipine (NORVASC) 5 MG tablet Take 1 tablet (5 mg total) by mouth daily.   aspirin EC 81 MG tablet Take 81 mg by mouth daily. Swallow whole.   busPIRone (BUSPAR) 10 MG tablet Take 1 tablet (10 mg total) by mouth daily as needed (for mood).   diphenhydrAMINE (BENADRYL) 25 mg capsule Take 25 mg by mouth at bedtime as needed.   doxycycline (VIBRAMYCIN) 100 MG capsule    hydrochlorothiazide (HYDRODIURIL) 25 MG tablet TAKE 1 TABLET(25 MG) BY MOUTH DAILY   ibuprofen (ADVIL) 800 MG tablet TAKE 1 TABLET(800 MG) BY MOUTH DAILY AS NEEDED   lisinopril (ZESTRIL) 40 MG tablet TAKE 1 TABLET(40 MG) BY MOUTH DAILY   NP THYROID 30 MG tablet TAKE 1 TABLET(30 MG) BY MOUTH DAILY BEFORE BREAKFAST   pantoprazole (PROTONIX) 40 MG tablet Take 1 tablet (40 mg total) by mouth daily.   Psyllium (METAMUCIL FIBER PO) Take 2 capsules by mouth 3 (three) times daily.   No facility-administered encounter medications on file as of 08/29/2020.    Allergies (verified) Influenza vaccines   History: Past Medical History:  Diagnosis Date   Anxiety    Depression    Essential hypertension    H/O back injury 2016   Past Surgical History:  Procedure Laterality Date   ABDOMINAL HYSTERECTOMY     total   CATARACT EXTRACTION, BILATERAL  01/2020   CHOLECYSTECTOMY     NECK SURGERY     Family History  Problem Relation Age of Onset   Diabetes Mother    Diabetes Father    Diabetes Sister    Social History   Socioeconomic History   Marital status: Widowed    Spouse name: Not  on file   Number of children: 3   Years of education: Not on file   Highest education level: 8th grade  Occupational History   Occupation: retired  Tobacco Use   Smoking status: Never   Smokeless tobacco: Never  Vaping Use   Vaping Use: Never used  Substance and Sexual Activity   Alcohol use: Yes    Comment: 1-2 drinks per 6 months   Drug use: Never   Sexual activity: Not Currently    Birth control/protection: Surgical, Post-menopausal  Other Topics Concern   Not on file  Social History Narrative   Live with Insurance underwriter (middle)   Has 3 sons   Oldest lives in [place] lives in [place] (with 1 grandchild)   3 cats: lily-16 , troubles, whiskers (boys-5 years)      Enjoyed sewing, and knitting, but back injury made this worse. Read, tv, putt around, likes astrology      Diet: Eats what she wants. Enjoys veggies and fruits   Caffeine: 1-2 cups of coffee, iced tea   Water: 6-8 cups daily       Wear seatbelt   Does not wear sunscreen   Smoke and carbon monoxide detectors         Social Determinants of Health   Financial Resource Strain: Not on file  Food Insecurity: Not on file  Transportation Needs: Not on file  Physical Activity: Not on file  Stress: Not on file  Social Connections: Not on file    Tobacco Counseling Counseling given: Not Answered   Clinical Intake:                 Diabetic? No          Activities of Daily Living In your present state of health, do you have any difficulty performing the following activities: 03/04/2020  Hearing? N  Comment but will need them soon.  Vision? Y  Difficulty concentrating or making decisions? N  Walking or climbing stairs? Y  Comment have a kane to help walk.  Dressing or bathing? N  Doing errands, shopping? N  Some recent data might be hidden    Patient Care Team: Noreene Larsson, NP as PCP - General (Nurse Practitioner) Satira Sark, MD as PCP - Cardiology  (Cardiology)  Indicate any recent Medical Services you may have received from other than [facility] providers in the past year (date may be approximate).     Assessment:   This is a routine wellness examination for [place].  Hearing/Vision screen No results found.  Dietary issues and exercise activities discussed:     Goals Addressed   None   Depression Screen PHQ 2/9 Scores 05/14/2020 04/29/2020 04/21/2020 03/04/2020 04/19/2019 02/06/2019 11/01/2018  PHQ - 2 Score 1 0 0 3 0 1 2  PHQ- 9 Score - - - 7 - - 12  Exception Documentation - - - - Medical reason - -    Fall Risk Fall Risk  05/14/2020 04/29/2020 04/21/2020 07/11/2019 04/19/2019  Falls in the past year? 0 0 0 0 0  Number falls in past yr: - 0 0 0 0  Injury with Fall? 0 0 0 0 0  Risk for fall due to : No Fall Risks No Fall Risks No Fall Risks - No Fall Risks  Follow up Falls evaluation completed Falls evaluation completed Falls evaluation completed - Falls evaluation completed    FALL RISK PREVENTION PERTAINING TO THE HOME:  Any stairs in or around the home? Yes  If so, are there any without handrails? Yes  Home free of loose throw rugs in walkways, pet beds, electrical cords, etc? Yes  Adequate lighting in your home to reduce risk of falls? Yes   ASSISTIVE DEVICES UTILIZED TO PREVENT FALLS:  Life alert? No  Use of a cane, walker or w/c? Yes  Grab bars in the bathroom? No  Shower chair or bench in shower? No  Elevated toilet seat or a handicapped toilet? No   TIMED UP AND GO:  Was the test performed? No .    Cognitive Function: MMSE - Mini Mental State Exam 02/06/2019  Orientation to time 5  Orientation to Place 5  Registration 3  Attention/ Calculation 5  Recall 3  Language- name 2 objects 2  Language- repeat 1  Language- follow 3 step command 3  Language- read & follow direction 1  Write a sentence 1  Copy design 1  Total score 30        Immunizations Immunization History  Administered Date(s) Administered    Fluad Quad(high Dose 65+) 11/14/2019   Moderna Sars-Covid-2 Vaccination 03/18/2019, 04/15/2019, 12/05/2019    TDAP status: Up to date  Flu Vaccine status: Up to date  Pneumococcal vaccine status: Up to date  Covid-19 vaccine status: Completed vaccines  Qualifies for Shingles Vaccine? Yes  Zostavax completed No   Shingrix Completed?: No.    Education has been provided regarding the importance of this vaccine. Patient has been advised to call insurance company to determine out of pocket expense if they have not yet received this vaccine. Advised may also receive vaccine at local pharmacy or Health Dept. Verbalized acceptance and understanding.  Screening Tests Health Maintenance  Topic Date Due   Zoster Vaccines- Shingrix (1 of 2) Never done   COVID-19 Vaccine (4 - Booster for Moderna series) 04/03/2020   DEXA SCAN  05/14/2021 (Originally 10/24/2000)   TETANUS/TDAP  05/14/2021 (Originally 10/25/1952)   PNA vac Low Risk Adult (2 of 2 - PPSV23) 05/14/2021 (Originally 06/02/2018)   INFLUENZA VACCINE  09/01/2020   HPV VACCINES  Aged Out    Health Maintenance  Health Maintenance Due  Topic Date Due   Zoster Vaccines- Shingrix (1 of 2) Never done   COVID-19 Vaccine (4 - Booster for Moderna series) 04/03/2020    Colorectal cancer screening: No longer required.   Mammogram status: No longer required due to age.  Bone Density Scan: No longer required.   Lung Cancer Screening: (Low Dose CT Chest recommended if Age 5-80 years, 30 pack-year currently smoking OR have quit w/in 15years.) does not qualify.     Additional Screening:  Hepatitis C Screening: does qualify. Never done.   Vision Screening: Recommended annual ophthalmology exams for early detection of glaucoma and other disorders of the eye. Is the patient up to date with their annual eye exam?  Yes   Who is the provider or what is the name of the office in which the patient attends annual eye exams? Dr. Gershon Crane   If  pt is not established with a provider, would they like to be referred to a provider to establish care? No .   Dental Screening: Recommended annual dental exams for proper oral hygiene  Community Resource Referral / Chronic Care Management: CRR required this visit?  No   CCM required this visit?  No      Plan:     I have personally reviewed and noted the following in the patient's chart:   Medical and social history Use of alcohol, tobacco or illicit drugs  Current medications and supplements including opioid prescriptions.  Functional ability and status Nutritional status Physical activity Advanced directives List of other physicians Hospitalizations, surgeries, and ER visits in previous 12 months Vitals Screenings to include cognitive, depression, and falls Referrals and appointments  In addition, I have reviewed and discussed with patient certain preventive protocols, quality metrics, and best practice recommendations. A written personalized care plan for preventive services as well as general preventive health recommendations were provided to patient.     Lonn Georgia, LPN   [id number]   Nurse Notes: AWV conducted over the phone with pt consent to televisit. Pt was present in the home at the time of visit and provider in the office. This call took approx 20 min. Pt up to date on most care gaps and plans to look into the shingles vaccine.

## 2020-08-29 NOTE — Patient Instructions (Signed)
Nancy Schroeder , Thank you for taking time to come for your Medicare Wellness Visit. I appreciate your ongoing commitment to your health goals. Please review the following plan we discussed and let me know if I can assist you in the future.   Screening recommendations/referrals: Colonoscopy: No longer required.  Mammogram: No longer required.  Bone Density: No longer required.   Recommended yearly ophthalmology/optometry visit for glaucoma screening and checkup Recommended yearly dental visit for hygiene and checkup  Vaccinations: Influenza vaccine: Due fall 2022  Pneumococcal vaccine: Due 05/2021 Tdap vaccine: Due 05/2021  Shingles vaccine: Education provided, check with local pharmacy on pricing.     Advanced directives: N/A   Conditions/risks identified: None at this time.   Next appointment: 10/01/20 @ 9:40am with Dr. Posey Pronto   Preventive Care 65 Years and Older, Female Preventive care refers to lifestyle choices and visits with your health care provider that can promote health and wellness. What does preventive care include? A yearly physical exam. This is also called an annual well check. Dental exams once or twice a year. Routine eye exams. Ask your health care provider how often you should have your eyes checked. Personal lifestyle choices, including: Daily care of your teeth and gums. Regular physical activity. Eating a healthy diet. Avoiding tobacco and drug use. Limiting alcohol use. Practicing safe sex. Taking low-dose aspirin every day. Taking vitamin and mineral supplements as recommended by your health care provider. What happens during an annual well check? The services and screenings done by your health care provider during your annual well check will depend on your age, overall health, lifestyle risk factors, and family history of disease. Counseling  Your health care provider may ask you questions about your: Alcohol use. Tobacco use. Drug use. Emotional  well-being. Home and relationship well-being. Sexual activity. Eating habits. History of falls. Memory and ability to understand (cognition). Work and work Statistician. Reproductive health. Screening  You may have the following tests or measurements: Height, weight, and BMI. Blood pressure. Lipid and cholesterol levels. These may be checked every 5 years, or more frequently if you are over 29 years old. Skin check. Lung cancer screening. You may have this screening every year starting at age 62 if you have a 30-pack-year history of smoking and currently smoke or have quit within the past 15 years. Fecal occult blood test (FOBT) of the stool. You may have this test every year starting at age 77. Flexible sigmoidoscopy or colonoscopy. You may have a sigmoidoscopy every 5 years or a colonoscopy every 10 years starting at age 74. Hepatitis C blood test. Hepatitis B blood test. Sexually transmitted disease (STD) testing. Diabetes screening. This is done by checking your blood sugar (glucose) after you have not eaten for a while (fasting). You may have this done every 1-3 years. Bone density scan. This is done to screen for osteoporosis. You may have this done starting at age 45. Mammogram. This may be done every 1-2 years. Talk to your health care provider about how often you should have regular mammograms. Talk with your health care provider about your test results, treatment options, and if necessary, the need for more tests. Vaccines  Your health care provider may recommend certain vaccines, such as: Influenza vaccine. This is recommended every year. Tetanus, diphtheria, and acellular pertussis (Tdap, Td) vaccine. You may need a Td booster every 10 years. Zoster vaccine. You may need this after age 30. Pneumococcal 13-valent conjugate (PCV13) vaccine. One dose is recommended after age 17.  Pneumococcal polysaccharide (PPSV23) vaccine. One dose is recommended after age 4. Talk to your  health care provider about which screenings and vaccines you need and how often you need them. This information is not intended to replace advice given to you by your health care provider. Make sure you discuss any questions you have with your health care provider. Document Released: 02/14/2015 Document Revised: 10/08/2015 Document Reviewed: 11/19/2014 Elsevier Interactive Patient Education  2017 [place] Prevention in the Home Falls can cause injuries. They can happen to people of all ages. There are many things you can do to make your home safe and to help prevent falls. What can I do on the outside of my home? Regularly fix the edges of walkways and driveways and fix any cracks. Remove anything that might make you trip as you walk through a door, such as a raised step or threshold. Trim any bushes or trees on the path to your home. Use bright outdoor lighting. Clear any walking paths of anything that might make someone trip, such as rocks or tools. Regularly check to see if handrails are loose or broken. Make sure that both sides of any steps have handrails. Any raised decks and porches should have guardrails on the edges. Have any leaves, snow, or ice cleared regularly. Use sand or salt on walking paths during winter. Clean up any spills in your garage right away. This includes oil or grease spills. What can I do in the bathroom? Use night lights. Install grab bars by the toilet and in the tub and shower. Do not use towel bars as grab bars. Use non-skid mats or decals in the tub or shower. If you need to sit down in the shower, use a plastic, non-slip stool. Keep the floor dry. Clean up any water that spills on the floor as soon as it happens. Remove soap buildup in the tub or shower regularly. Attach bath mats securely with double-sided non-slip rug tape. Do not have throw rugs and other things on the floor that can make you trip. What can I do in the bedroom? Use night  lights. Make sure that you have a light by your bed that is easy to reach. Do not use any sheets or blankets that are too big for your bed. They should not hang down onto the floor. Have a firm chair that has side arms. You can use this for support while you get dressed. Do not have throw rugs and other things on the floor that can make you trip. What can I do in the kitchen? Clean up any spills right away. Avoid walking on wet floors. Keep items that you use a lot in easy-to-reach places. If you need to reach something above you, use a strong step stool that has a grab bar. Keep electrical cords out of the way. Do not use floor polish or wax that makes floors slippery. If you must use wax, use non-skid floor wax. Do not have throw rugs and other things on the floor that can make you trip. What can I do with my stairs? Do not leave any items on the stairs. Make sure that there are handrails on both sides of the stairs and use them. Fix handrails that are broken or loose. Make sure that handrails are as long as the stairways. Check any carpeting to make sure that it is firmly attached to the stairs. Fix any carpet that is loose or worn. Avoid having throw rugs at the  top or bottom of the stairs. If you do have throw rugs, attach them to the floor with carpet tape. Make sure that you have a light switch at the top of the stairs and the bottom of the stairs. If you do not have them, ask someone to add them for you. What else can I do to help prevent falls? Wear shoes that: Do not have high heels. Have rubber bottoms. Are comfortable and fit you well. Are closed at the toe. Do not wear sandals. If you use a stepladder: Make sure that it is fully opened. Do not climb a closed stepladder. Make sure that both sides of the stepladder are locked into place. Ask someone to hold it for you, if possible. Clearly mark and make sure that you can see: Any grab bars or handrails. First and last  steps. Where the edge of each step is. Use tools that help you move around (mobility aids) if they are needed. These include: Canes. Walkers. Scooters. Crutches. Turn on the lights when you go into a dark area. Replace any light bulbs as soon as they burn out. Set up your furniture so you have a clear path. Avoid moving your furniture around. If any of your floors are uneven, fix them. If there are any pets around you, be aware of where they are. Review your medicines with your doctor. Some medicines can make you feel dizzy. This can increase your chance of falling. Ask your doctor what other things that you can do to help prevent falls. This information is not intended to replace advice given to you by your health care provider. Make sure you discuss any questions you have with your health care provider. Document Released: 11/14/2008 Document Revised: 06/26/2015 Document Reviewed: 02/22/2014 Elsevier Interactive Patient Education  2017 [employer].

## 2020-10-01 ENCOUNTER — Other Ambulatory Visit: Payer: Self-pay

## 2020-10-01 ENCOUNTER — Encounter: Payer: Self-pay | Admitting: Internal Medicine

## 2020-10-01 ENCOUNTER — Ambulatory Visit (INDEPENDENT_AMBULATORY_CARE_PROVIDER_SITE_OTHER): Payer: Medicare HMO | Admitting: Internal Medicine

## 2020-10-01 VITALS — BP 154/79 | HR 78 | Temp 98.1°F | Resp 18 | Ht 60.0 in | Wt 172.0 lb

## 2020-10-01 DIAGNOSIS — Z0001 Encounter for general adult medical examination with abnormal findings: Secondary | ICD-10-CM | POA: Diagnosis not present

## 2020-10-01 DIAGNOSIS — E038 Other specified hypothyroidism: Secondary | ICD-10-CM | POA: Diagnosis not present

## 2020-10-01 DIAGNOSIS — J019 Acute sinusitis, unspecified: Secondary | ICD-10-CM | POA: Diagnosis not present

## 2020-10-01 DIAGNOSIS — Z20822 Contact with and (suspected) exposure to covid-19: Secondary | ICD-10-CM

## 2020-10-01 DIAGNOSIS — G500 Trigeminal neuralgia: Secondary | ICD-10-CM | POA: Insufficient documentation

## 2020-10-01 DIAGNOSIS — Z23 Encounter for immunization: Secondary | ICD-10-CM | POA: Diagnosis not present

## 2020-10-01 DIAGNOSIS — I10 Essential (primary) hypertension: Secondary | ICD-10-CM | POA: Diagnosis not present

## 2020-10-01 MED ORDER — CARBAMAZEPINE ER 100 MG PO CP12: 100.0000 mg | ORAL_CAPSULE | Freq: Two times a day (BID) | ORAL | 2 refills | Status: DC

## 2020-10-01 MED ORDER — AZITHROMYCIN 250 MG PO TABS: ORAL_TABLET | ORAL | 0 refills | Status: AC

## 2020-10-01 NOTE — Patient Instructions (Signed)
Health Maintenance, Female Adopting a healthy lifestyle and getting preventive care are important in promoting health and wellness. Ask your health care provider about: The right schedule for you to have regular tests and exams. Things you can do on your own to prevent diseases and keep yourself healthy. What should I know about diet, weight, and exercise? Eat a healthy diet  Eat a diet that includes plenty of vegetables, fruits, low-fat dairy products, and lean protein. Do not eat a lot of foods that are high in solid fats, added sugars, or sodium. Maintain a healthy weight Body mass index (BMI) is used to identify weight problems. It estimates body fat based on height and weight. Your health care provider can help determine your BMI and help you achieve or maintain a healthy weight. Get regular exercise Get regular exercise. This is one of the most important things you can do for your health. Most adults should: Exercise for at least 150 minutes each week. The exercise should increase your heart rate and make you sweat (moderate-intensity exercise). Do strengthening exercises at least twice a week. This is in addition to the moderate-intensity exercise. Spend less time sitting. Even light physical activity can be beneficial. Watch cholesterol and blood lipids Have your blood tested for lipids and cholesterol at 85 years of age, then have this test every 5 years. Have your cholesterol levels checked more often if: Your lipid or cholesterol levels are high. You are older than 85 years of age. You are at high risk for heart disease. What should I know about cancer screening? Depending on your health history and family history, you may need to have cancer screening at various ages. This may include screening for: Breast cancer. Cervical cancer. Colorectal cancer. Skin cancer. Lung cancer. What should I know about heart disease, diabetes, and high blood pressure? Blood pressure and heart  disease High blood pressure causes heart disease and increases the risk of stroke. This is more likely to develop in people who have high blood pressure readings, are of African descent, or are overweight. Have your blood pressure checked: Every 3-5 years if you are 18-39 years of age. Every year if you are 40 years old or older. Diabetes Have regular diabetes screenings. This checks your fasting blood sugar level. Have the screening done: Once every three years after age 40 if you are at a normal weight and have a low risk for diabetes. More often and at a younger age if you are overweight or have a high risk for diabetes. What should I know about preventing infection? Hepatitis B If you have a higher risk for hepatitis B, you should be screened for this virus. Talk with your health care provider to find out if you are at risk for hepatitis B infection. Hepatitis C Testing is recommended for: Everyone born from 1945 through 1965. Anyone with known risk factors for hepatitis C. Sexually transmitted infections (STIs) Get screened for STIs, including gonorrhea and chlamydia, if: You are sexually active and are younger than 85 years of age. You are older than 85 years of age and your health care provider tells you that you are at risk for this type of infection. Your sexual activity has changed since you were last screened, and you are at increased risk for chlamydia or gonorrhea. Ask your health care provider if you are at risk. Ask your health care provider about whether you are at high risk for HIV. Your health care provider may recommend a prescription medicine   to help prevent HIV infection. If you choose to take medicine to prevent HIV, you should first get tested for HIV. You should then be tested every 3 months for as long as you are taking the medicine. Pregnancy If you are about to stop having your period (premenopausal) and you may become pregnant, seek counseling before you get  pregnant. Take 400 to 800 micrograms (mcg) of folic acid every day if you become pregnant. Ask for birth control (contraception) if you want to prevent pregnancy. Osteoporosis and menopause Osteoporosis is a disease in which the bones lose minerals and strength with aging. This can result in bone fractures. If you are 65 years old or older, or if you are at risk for osteoporosis and fractures, ask your health care provider if you should: Be screened for bone loss. Take a calcium or vitamin D supplement to lower your risk of fractures. Be given hormone replacement therapy (HRT) to treat symptoms of menopause. Follow these instructions at home: Lifestyle Do not use any products that contain nicotine or tobacco, such as cigarettes, e-cigarettes, and chewing tobacco. If you need help quitting, ask your health care provider. Do not use street drugs. Do not share needles. Ask your health care provider for help if you need support or information about quitting drugs. Alcohol use Do not drink alcohol if: Your health care provider tells you not to drink. You are pregnant, may be pregnant, or are planning to become pregnant. If you drink alcohol: Limit how much you use to 0-1 drink a day. Limit intake if you are breastfeeding. Be aware of how much alcohol is in your drink. In the [place] one drink equals one 12 oz bottle of beer (355 mL), one 5 oz glass of wine (148 mL), or one 1 oz glass of hard liquor (44 mL). General instructions Schedule regular health, dental, and eye exams. Stay current with your vaccines. Tell your health care provider if: You often feel depressed. You have ever been abused or do not feel safe at home. Summary Adopting a healthy lifestyle and getting preventive care are important in promoting health and wellness. Follow your health care provider's instructions about healthy diet, exercising, and getting tested or screened for diseases. Follow your health care provider's  instructions on monitoring your cholesterol and blood pressure. This information is not intended to replace advice given to you by your health care provider. Make sure you discuss any questions you have with your health care provider. Document Revised: 03/28/2020 Document Reviewed: 01/11/2018 Elsevier Patient Education  2022 Elsevier Inc.  

## 2020-10-01 NOTE — Assessment & Plan Note (Signed)
Check TSH and free T4 Has stopped NP thyroid now

## 2020-10-01 NOTE — Assessment & Plan Note (Signed)
Annual exam as documented. Counseling done  re healthy lifestyle involving commitment to 150 minutes exercise per week, heart healthy diet, and attaining healthy weight.The importance of adequate sleep also discussed. Changes in health habits are decided on by the patient with goals and time frames  set for achieving them. Immunization and cancer screening needs are specifically addressed at this visit. 

## 2020-10-01 NOTE — Assessment & Plan Note (Signed)

## 2020-10-01 NOTE — Progress Notes (Signed)
Established Patient Office Visit  Subjective:  Patient ID: Nancy Schroeder, female    DOB: 1933-02-11  Age: 87 y.o. MRN: [id number]  CC:  Chief Complaint  Patient presents with   Annual Exam    Annual exam pt has been feeling fatigued coughing runny nose and sneezing for the last two weeks no covid test     HPI Nancy Schroeder is a 87 year old female who presents for annual physical.  Her BP was elevated today, but she states that her BP had been running around 200s and it has been better since she stopped NP thyroid. She also does not heaviness and throbbing headache. Denies any dizziness, chest pain, dyspnea or palpitations.  She has been having episodes of pain over face and right side of temporal area, and gets irritation upon touching as well. Episodes last for few seconds. She states that her symptoms started after having cataract surgery. Denies any blurry vision or numbness over facial area. She continues to have fatigue, but denies any weight loss, night sweats.  She has been having nasal congestion, sore throat and chills for last 2 weeks. She denies any fever. Denies dyspnea or wheezing. Denies any sick contact. Has not had COVID test.  She is up-to-date with COVID vaccine. She received flu vaccine in the office today.  Past Medical History:  Diagnosis Date   Anxiety    Depression    Essential hypertension    H/O back injury 2016    Past Surgical History:  Procedure Laterality Date   ABDOMINAL HYSTERECTOMY     total   CATARACT EXTRACTION, BILATERAL  01/2020   CHOLECYSTECTOMY     NECK SURGERY      Family History  Problem Relation Age of Onset   Diabetes Mother    Diabetes Father    Diabetes Sister     Social History   Socioeconomic History   Marital status: Widowed    Spouse name: Not on file   Number of children: 3   Years of education: Not on file   Highest education level: 8th grade  Occupational History   Occupation: retired  Tobacco Use    Smoking status: Never   Smokeless tobacco: Never  Vaping Use   Vaping Use: Never used  Substance and Sexual Activity   Alcohol use: Yes    Comment: 1-2 drinks per 6 months   Drug use: Never   Sexual activity: Not Currently    Birth control/protection: Surgical, Post-menopausal  Other Topics Concern   Not on file  Social History Narrative   Live with Insurance underwriter (middle)   Has 3 sons   Oldest lives in [place] lives in [place] (with 1 grandchild)   3 cats: lily-16 , troubles, whiskers (boys-5 years)      Enjoyed sewing, and knitting, but back injury made this worse. Read, tv, putt around, likes astrology      Diet: Eats what she wants. Enjoys veggies and fruits   Caffeine: 1-2 cups of coffee, iced tea   Water: 6-8 cups daily       Wear seatbelt   Does not wear sunscreen   Smoke and carbon monoxide detectors         Social Determinants of Health   Financial Resource Strain: Medium Risk   Difficulty of Paying Living Expenses: Somewhat hard  Food Insecurity: No Food Insecurity   Worried About Charity fundraiser in the Last Year: Never true   Ran Out of Food in the  Last Year: Never true  Transportation Needs: No Transportation Needs   Lack of Transportation (Medical): No   Lack of Transportation (Non-Medical): No  Physical Activity: Insufficiently Active   Days of Exercise per Week: 3 days   Minutes of Exercise per Session: 20 min  Stress: No Stress Concern Present   Feeling of Stress : Not at all  Social Connections: Socially Isolated   Frequency of Communication with Friends and Family: Once a week   Frequency of Social Gatherings with Friends and Family: Once a week   Attends Religious Services: Never   Marine scientist or Organizations: No   Attends Archivist Meetings: Never   Marital Status: Widowed  Human resources officer Violence: Not At Risk   Fear of Current or Ex-Partner: No   Emotionally Abused: No   Physically Abused: No    Sexually Abused: No    Outpatient Medications Prior to Visit  Medication Sig Dispense Refill   amLODipine (NORVASC) 5 MG tablet Take 1 tablet (5 mg total) by mouth daily. 90 tablet 3   aspirin EC 81 MG tablet Take 81 mg by mouth daily. Swallow whole.     busPIRone (BUSPAR) 10 MG tablet Take 1 tablet (10 mg total) by mouth daily as needed (for mood). 180 tablet 1   diphenhydrAMINE (BENADRYL) 25 mg capsule Take 25 mg by mouth at bedtime as needed.     hydrochlorothiazide (HYDRODIURIL) 25 MG tablet TAKE 1 TABLET(25 MG) BY MOUTH DAILY 90 tablet 1   ibuprofen (ADVIL) 800 MG tablet TAKE 1 TABLET(800 MG) BY MOUTH DAILY AS NEEDED 30 tablet 2   lisinopril (ZESTRIL) 40 MG tablet TAKE 1 TABLET(40 MG) BY MOUTH DAILY 90 tablet 1   pantoprazole (PROTONIX) 40 MG tablet Take 1 tablet (40 mg total) by mouth daily. 30 tablet 3   Psyllium (METAMUCIL FIBER PO) Take 2 capsules by mouth 3 (three) times daily.     doxycycline (VIBRAMYCIN) 100 MG capsule      NP THYROID 30 MG tablet TAKE 1 TABLET(30 MG) BY MOUTH DAILY BEFORE BREAKFAST 30 tablet 3   No facility-administered medications prior to visit.    No Active Allergies  ROS Review of Systems  Constitutional:  Positive for fatigue. Negative for chills and fever.  HENT:  Positive for congestion, sinus pressure and sore throat. Negative for sinus pain.   Eyes:  Negative for pain and discharge.  Respiratory:  Negative for cough and shortness of breath.   Cardiovascular:  Negative for chest pain and palpitations.  Gastrointestinal:  Negative for abdominal pain, constipation, diarrhea, nausea and vomiting.  Endocrine: Negative for polydipsia and polyuria.  Genitourinary:  Negative for dysuria and hematuria.  Musculoskeletal:  Negative for neck pain and neck stiffness.  Skin:  Negative for rash.  Neurological:  Positive for headaches. Negative for dizziness and weakness.  Psychiatric/Behavioral:  Negative for agitation and behavioral problems.       Objective:    Physical Exam Vitals reviewed.  Constitutional:      General: She is not in acute distress.    Appearance: She is not diaphoretic.  HENT:     Head: Normocephalic and atraumatic.     Nose: Congestion present.     Mouth/Throat:     Mouth: Mucous membranes are moist.     Pharynx: No posterior oropharyngeal erythema.  Eyes:     General: No scleral icterus.    Extraocular Movements: Extraocular movements intact.  Cardiovascular:     Rate and Rhythm: Normal  rate and regular rhythm.     Pulses: Normal pulses.     Heart sounds: Normal heart sounds. No murmur heard. Pulmonary:     Breath sounds: Normal breath sounds. No wheezing or rales.  Abdominal:     Palpations: Abdomen is soft.     Tenderness: There is no abdominal tenderness.  Musculoskeletal:     Cervical back: Neck supple. No tenderness.     Right lower leg: No edema.     Left lower leg: No edema.  Skin:    General: Skin is warm.     Findings: No rash.  Neurological:     General: No focal deficit present.     Mental Status: She is alert and oriented to person, place, and time.     Sensory: No sensory deficit.     Motor: No weakness.  Psychiatric:        Mood and Affect: Mood normal.        Behavior: Behavior normal.    BP (!) 154/79 (BP Location: Left Arm, Patient Position: Sitting, Cuff Size: Normal)   Pulse 78   Temp 98.1 F (36.7 C) (Oral)   Resp 18   Ht 5' (1.524 m)   Wt 172 lb (78 kg)   SpO2 99%   BMI 33.59 kg/m  Wt Readings from Last 3 Encounters:  10/01/20 172 lb (78 kg)  05/14/20 171 lb (77.6 kg)  04/29/20 170 lb (77.1 kg)     Health Maintenance Due  Topic Date Due   Zoster Vaccines- Shingrix (1 of 2) Never done   COVID-19 Vaccine (4 - Booster for Moderna series) 04/03/2020    There are no preventive care reminders to display for this patient.  Lab Results  Component Value Date   TSH 2.05 03/04/2020   Lab Results  Component Value Date   WBC 7.4 04/16/2020   HGB 12.4  04/16/2020   HCT 37.5 04/16/2020   MCV 94.5 04/16/2020   PLT 255 04/16/2020   Lab Results  Component Value Date   NA 141 04/21/2020   K 4.1 04/21/2020   CO2 20 04/21/2020   GLUCOSE 118 (H) 04/21/2020   BUN 11 04/21/2020   CREATININE 0.75 04/21/2020   BILITOT 0.5 04/16/2020   ALKPHOS 52 04/16/2020   AST 24 04/16/2020   ALT 27 04/16/2020   PROT 6.3 (L) 04/16/2020   ALBUMIN 3.4 (L) 04/16/2020   CALCIUM 9.4 04/21/2020   ANIONGAP 10 04/16/2020   EGFR 78 04/21/2020   Lab Results  Component Value Date   CHOL 186 04/21/2020   Lab Results  Component Value Date   HDL 51 04/21/2020   Lab Results  Component Value Date   LDLCALC 111 (H) 04/21/2020   Lab Results  Component Value Date   TRIG 137 04/21/2020   Lab Results  Component Value Date   CHOLHDL 3.6 08/02/2018   Lab Results  Component Value Date   HGBA1C 5.5 08/02/2018      Assessment & Plan:   Problem List Items Addressed This Visit       Encounter for general adult medical examination with abnormal findings - Primary   Annual exam as documented. Counseling done  re healthy lifestyle involving commitment to 150 minutes exercise per week, heart healthy diet, and attaining healthy weight.The importance of adequate sleep also discussed. Changes in health habits are decided on by the patient with goals and time frames  set for achieving them. Immunization and cancer screening needs are specifically addressed at  this visit.     Relevant Orders  CBC with Differential  Comprehensive metabolic panel  Hemoglobin A1c  Lipid panel    Cardiovascular and Mediastinum   Essential hypertension    BP Readings from Last 1 Encounters:  10/01/20 (!) 154/79  Uncontrolled, but better compared to prior since stopping NP thyroid Check TSH and free T4 Avoid changing medication for now Counseled for compliance with the medications Advised DASH diet and moderate exercise/walking as tolerated        Respiratory   Acute  sinusitis   Relevant Medications   azithromycin (ZITHROMAX) 250 MG tablet     Endocrine   Subclinical hypothyroidism    Check TSH and free T4 Has stopped NP thyroid now      Relevant Orders   TSH   T4, free     Nervous and Auditory   Trigeminal neuralgia pain    Headache description likely suggestive of trigeminal neuralgia Started Carbamazepine 100 mg BID      Relevant Medications   carbamazepine (CARBATROL) 100 MG 12 hr capsule     Other      Need for immunization against influenza    Patient was educated on the recommendation for flu vaccine. After obtaining informed consent, the vaccine was administered no adverse effects noted at time of administration. Patient provided with education on arm soreness and use of tylenol or ibuprofen (if safe) for this. Encourage to use the arm vaccine was given in to help reduce the soreness. Patient educated on the signs of a reaction to the vaccine and advised to contact the office should these occur.      Relevant Orders   Flu Vaccine QUAD High Dose(Fluad) (Completed)   Other Visit Diagnoses     Suspected COVID-19 virus infection       Relevant Orders   Novel Coronavirus, NAA ([facility])       Meds ordered this encounter  Medications   azithromycin (ZITHROMAX) 250 MG tablet    Sig: Take 2 tablets on day 1, then 1 tablet daily on days 2 through 5    Dispense:  6 tablet    Refill:  0   carbamazepine (CARBATROL) 100 MG 12 hr capsule    Sig: Take 1 capsule (100 mg total) by mouth 2 (two) times daily.    Dispense:  60 capsule    Refill:  2    Follow-up: Return in about 3 months (around 12/31/2020) for HTN and headache.    Lindell Spar, MD

## 2020-10-01 NOTE — Assessment & Plan Note (Signed)
Headache description likely suggestive of trigeminal neuralgia Started Carbamazepine 100 mg BID

## 2020-10-01 NOTE — Assessment & Plan Note (Signed)
BP Readings from Last 1 Encounters:  10/01/20 (!) 154/79   Uncontrolled, but better compared to prior since stopping NP thyroid Check TSH and free T4 Avoid changing medication for now Counseled for compliance with the medications Advised DASH diet and moderate exercise/walking as tolerated

## 2020-10-02 LAB — COMPREHENSIVE METABOLIC PANEL
ALT: 23 IU/L (ref 0–32)
AST: 19 IU/L (ref 0–40)
Albumin/Globulin Ratio: 2 (ref 1.2–2.2)
Albumin: 4.2 g/dL (ref 3.6–4.6)
Alkaline Phosphatase: 72 IU/L (ref 44–121)
BUN/Creatinine Ratio: 16 (ref 12–28)
BUN: 12 mg/dL (ref 8–27)
Bilirubin Total: 0.5 mg/dL (ref 0.0–1.2)
CO2: 21 mmol/L (ref 20–29)
Calcium: 9.7 mg/dL (ref 8.7–10.3)
Chloride: 106 mmol/L (ref 96–106)
Creatinine, Ser: 0.75 mg/dL (ref 0.57–1.00)
Globulin, Total: 2.1 g/dL (ref 1.5–4.5)
Glucose: 103 mg/dL — ABNORMAL HIGH (ref 65–99)
Potassium: 4.3 mmol/L (ref 3.5–5.2)
Sodium: 143 mmol/L (ref 134–144)
Total Protein: 6.3 g/dL (ref 6.0–8.5)
eGFR: 78 mL/min/{1.73_m2} (ref >59)

## 2020-10-02 LAB — CBC WITH DIFFERENTIAL/PLATELET
Basophils Absolute: 0.1 10*3/uL (ref 0.0–0.2)
Basos: 1 %
EOS (ABSOLUTE): 0.1 10*3/uL (ref 0.0–0.4)
Eos: 1 %
Hematocrit: 39.4 % (ref 34.0–46.6)
Hemoglobin: 13.5 g/dL (ref 11.1–15.9)
Immature Grans (Abs): 0 10*3/uL (ref 0.0–0.1)
Immature Granulocytes: 0 %
Lymphocytes Absolute: 2.1 10*3/uL (ref 0.7–3.1)
Lymphs: 21 %
MCH: 31.4 pg (ref 26.6–33.0)
MCHC: 34.3 g/dL (ref 31.5–35.7)
MCV: 92 fL (ref 79–97)
Monocytes Absolute: 0.9 10*3/uL (ref 0.1–0.9)
Monocytes: 9 %
Neutrophils Absolute: 6.8 10*3/uL (ref 1.4–7.0)
Neutrophils: 68 %
Platelets: 347 10*3/uL (ref 150–450)
RBC: 4.3 x10E6/uL (ref 3.77–5.28)
RDW: 12.3 % (ref 11.7–15.4)
WBC: 10 10*3/uL (ref 3.4–10.8)

## 2020-10-02 LAB — LIPID PANEL
Chol/HDL Ratio: 3.7 ratio (ref 0.0–4.4)
Cholesterol, Total: 167 mg/dL (ref 100–199)
HDL: 45 mg/dL (ref >39)
LDL Chol Calc (NIH): 102 mg/dL — ABNORMAL HIGH (ref 0–99)
Triglycerides: 108 mg/dL (ref 0–149)
VLDL Cholesterol Cal: 20 mg/dL (ref 5–40)

## 2020-10-02 LAB — HEMOGLOBIN A1C
Est. average glucose Bld gHb Est-mCnc: 117 mg/dL
Hgb A1c MFr Bld: 5.7 % — ABNORMAL HIGH (ref 4.8–5.6)

## 2020-10-02 LAB — TSH: TSH: 3.44 u[IU]/mL (ref 0.450–4.500)

## 2020-10-02 LAB — T4, FREE: Free T4: 1.25 ng/dL (ref 0.82–1.77)

## 2020-10-03 LAB — SARS-COV-2, NAA 2 DAY TAT

## 2020-10-03 LAB — NOVEL CORONAVIRUS, NAA: SARS-CoV-2, NAA: NOT DETECTED

## 2020-11-04 ENCOUNTER — Ambulatory Visit: Payer: Medicare HMO | Admitting: Podiatry

## 2020-11-04 ENCOUNTER — Encounter: Payer: Self-pay | Admitting: Podiatry

## 2020-11-04 ENCOUNTER — Other Ambulatory Visit: Payer: Self-pay

## 2020-11-04 DIAGNOSIS — M79674 Pain in right toe(s): Secondary | ICD-10-CM | POA: Diagnosis not present

## 2020-11-04 DIAGNOSIS — B351 Tinea unguium: Secondary | ICD-10-CM | POA: Diagnosis not present

## 2020-11-04 DIAGNOSIS — I739 Peripheral vascular disease, unspecified: Secondary | ICD-10-CM | POA: Diagnosis not present

## 2020-11-04 DIAGNOSIS — M79675 Pain in left toe(s): Secondary | ICD-10-CM

## 2020-11-09 NOTE — Progress Notes (Signed)
  Subjective:  Patient ID: Nancy Schroeder, female    DOB: 02-26-1933,  MRN: [id number]  Nancy Schroeder presents to clinic today for for at risk foot care. Patient has h/o PAD and thick, elongated toenails b/l feet which are tender when wearing enclosed shoe gear.  Patient is accompanied by her son who also has an appointment on today.   PCP is Noreene Larsson, NP , and last visit was 04/29/2020.  No Active Allergies  Review of Systems: Negative except as noted in the HPI. Objective:   Constitutional Nancy Schroeder is a pleasant 86 y.o. Caucasian female, WD, WN in NAD. AAO x 3.   Vascular Capillary fill time to digits delayed b/l lower extremities. Faintly palpable DP pulse(s) b/l lower extremities. Faintly palpable PT pulse(s) b/l lower extremities. Pedal hair absent. Lower extremity skin temperature gradient within normal limits. No pain with calf compression b/l. No edema noted b/l lower extremities. No cyanosis or clubbing noted.  Neurologic Normal speech. Oriented to person, place, and time. Protective sensation intact 5/5 intact bilaterally with 10g monofilament b/l. Vibratory sensation intact b/l.  Dermatologic Skin warm and supple b/l lower extremities. No open wounds b/l lower extremities. No interdigital macerations b/l lower extremities. Toenails 1-5 b/l elongated, discolored, dystrophic, thickened, crumbly with subungual debris and tenderness to dorsal palpation.  Orthopedic: Normal muscle strength 5/5 to all lower extremity muscle groups bilaterally. Hallux valgus with bunion deformity noted b/l lower extremities. Hammertoe(s) noted to the 2-5 bilaterally.   Radiographs: None Assessment:   1. Pain due to onychomycosis of toenails of both feet   2. Peripheral vascular disease ([place])    Plan:  Patient was evaluated and treated and all questions answered. Consent given for treatment as described below: -No new findings. No new orders. -Patient to continue soft,  supportive shoe gear daily. -Toenails 1-5 b/l were debrided in length and girth with sterile nail nippers and dremel without iatrogenic bleeding.  -Patient to report any pedal injuries to medical professional immediately. -Patient/POA to call should there be question/concern in the interim.  Return in about 3 months (around 02/04/2021).  Marzetta Board, DPM

## 2020-11-11 ENCOUNTER — Other Ambulatory Visit (INDEPENDENT_AMBULATORY_CARE_PROVIDER_SITE_OTHER): Payer: Self-pay | Admitting: Nurse Practitioner

## 2020-11-18 ENCOUNTER — Other Ambulatory Visit: Payer: Self-pay | Admitting: *Deleted

## 2020-11-18 MED ORDER — LISINOPRIL 40 MG PO TABS: ORAL_TABLET | ORAL | 1 refills | Status: DC

## 2020-12-03 ENCOUNTER — Other Ambulatory Visit (INDEPENDENT_AMBULATORY_CARE_PROVIDER_SITE_OTHER): Payer: Self-pay | Admitting: Nurse Practitioner

## 2020-12-07 ENCOUNTER — Other Ambulatory Visit (INDEPENDENT_AMBULATORY_CARE_PROVIDER_SITE_OTHER): Payer: Self-pay | Admitting: Nurse Practitioner

## 2020-12-07 DIAGNOSIS — F341 Dysthymic disorder: Secondary | ICD-10-CM

## 2020-12-29 ENCOUNTER — Encounter: Payer: Self-pay | Admitting: Neurology

## 2020-12-29 ENCOUNTER — Ambulatory Visit: Payer: Medicare HMO | Admitting: Neurology

## 2020-12-29 ENCOUNTER — Other Ambulatory Visit: Payer: Self-pay

## 2020-12-29 VITALS — BP 179/70 | HR 74 | Ht 60.0 in | Wt 179.0 lb

## 2020-12-29 DIAGNOSIS — R5383 Other fatigue: Secondary | ICD-10-CM | POA: Diagnosis not present

## 2020-12-29 DIAGNOSIS — G500 Trigeminal neuralgia: Secondary | ICD-10-CM

## 2020-12-29 DIAGNOSIS — G44229 Chronic tension-type headache, not intractable: Secondary | ICD-10-CM

## 2020-12-29 MED ORDER — CARBAMAZEPINE 200 MG PO TABS: 300.0000 mg | ORAL_TABLET | Freq: Two times a day (BID) | ORAL | 6 refills | Status: DC

## 2020-12-29 NOTE — Progress Notes (Signed)
[facility]  PATIENT: Nancy Schroeder DOB: 23-Aug-1933  REQUESTING CLINICIAN: Noreene Larsson, NP HISTORY FROM: Patient and son  REASON FOR VISIT: Headaches    HISTORICAL  CHIEF COMPLAINT:  Chief Complaint  Patient presents with   New Patient (Initial Visit)    Rm 12. Accompanied by son. NP/Internal referral for headaches. Pt states she has headache pain almost every day. C/o right sided pressure and pain around eyes.    HISTORY OF PRESENT ILLNESS:  This is a 87 year old woman with past medical history of anxiety, ?hypothyroidism, hypertension and cataract who is presenting with headache.  Patient said that she had cataract surgery in January 2022, following surgery she started developing lower jaw sharp pain.  Patient said the pain occurs daily, sometime multiple times per day.  It involves the lower jaw sometimes the maxillary area also.  Pain described as sharp shooting pain lasting for few seconds and go away.  On top of this pain, she also have frontal dull headache that can last hours.  She reported both headaches are new since her surgery.  She has been taking Tylenol and ibuprofen with minimal relief.  Has not been on a preventive medical.  She denies any family history of headaches, and denies any previous history of headaches.    Headache History and Characteristics: Onset: January 2022 Location: Frontal headaches and right side lower jaw sharp pain  Quality:  aching and burning  Intensity: 7-8/10.  Duration: last last a few sec (jaw pain) or hours (dull frontal headaches)  Migrainous Features: No Aura: No  History of brain injury or tumor: No  Family history: No family history of headaches  Motion sickness: no Cardiac history: no  OTC: tylenol, Ibuprofen  Caffeine: 1 to 2 cups of coffee in the morning Sleep: 4 to 5  hrs sleep  Mood/ Stress: Very stress and anxious about her headaches and health in general.   Prior  prophylaxis: Propranolol: No  Verapamil:No TCA: No Topamax: No Depakote: No Effexor: No Cymbalta: No Neurontin:No  Prior abortives: Triptan: No Anti-emetic: No Steroids: No Ergotamine suppository: No    OTHER MEDICAL CONDITIONS: HTN, Cataracts, Anxiety    REVIEW OF SYSTEMS: Full 14 system review of systems performed and negative with exception of: as noted in the HPI   ALLERGIES: No Active Allergies  HOME MEDICATIONS: Outpatient Medications Prior to Visit  Medication Sig Dispense Refill   amLODipine (NORVASC) 5 MG tablet Take 1 tablet (5 mg total) by mouth daily. 90 tablet 3   aspirin EC 81 MG tablet Take 81 mg by mouth daily. Swallow whole.     busPIRone (BUSPAR) 10 MG tablet Take 1 tablet (10 mg total) by mouth daily as needed (for mood). 180 tablet 1   diphenhydrAMINE (BENADRYL) 25 mg capsule Take 25 mg by mouth at bedtime as needed.     hydrochlorothiazide (HYDRODIURIL) 25 MG tablet TAKE 1 TABLET(25 MG) BY MOUTH DAILY 90 tablet 1   ibuprofen (ADVIL) 800 MG tablet TAKE 1 TABLET(800 MG) BY MOUTH DAILY AS NEEDED 30 tablet 2   lisinopril (ZESTRIL) 40 MG tablet TAKE 1 TABLET(40 MG) BY MOUTH DAILY 90 tablet 1   pantoprazole (PROTONIX) 40 MG tablet Take 1 tablet (40 mg total) by mouth daily. 30 tablet 3   Psyllium (METAMUCIL FIBER PO) Take 2 capsules by mouth 3 (three) times daily.     carbamazepine (CARBATROL) 100 MG 12 hr capsule Take 1 capsule (100 mg total) by mouth 2 (two) times daily. [place]  capsule 2   No facility-administered medications prior to visit.    PAST MEDICAL HISTORY: Past Medical History:  Diagnosis Date   Anxiety    Depression    Essential hypertension    H/O back injury 2016    PAST SURGICAL HISTORY: Past Surgical History:  Procedure Laterality Date   ABDOMINAL HYSTERECTOMY     total   CATARACT EXTRACTION, BILATERAL  01/2020   CHOLECYSTECTOMY     NECK SURGERY      FAMILY HISTORY: Family History  Problem Relation Age of Onset   Diabetes  Mother    Diabetes Father    Diabetes Sister     SOCIAL HISTORY: Social History   Socioeconomic History   Marital status: Widowed    Spouse name: Not on file   Number of children: 3   Years of education: Not on file   Highest education level: 8th grade  Occupational History   Occupation: retired  Tobacco Use   Smoking status: Never   Smokeless tobacco: Never  Vaping Use   Vaping Use: Never used  Substance and Sexual Activity   Alcohol use: Yes    Comment: 1-2 drinks per 6 months   Drug use: Never   Sexual activity: Not Currently    Birth control/protection: Surgical, Post-menopausal  Other Topics Concern   Not on file  Social History Narrative   Live with Insurance underwriter (middle)   Has 3 sons   Oldest lives in [place] lives in [place] (with 1 grandchild)   3 cats: lily-16 , troubles, whiskers (boys-5 years)      Enjoyed sewing, and knitting, but back injury made this worse. Read, tv, putt around, likes astrology      Diet: Eats what she wants. Enjoys veggies and fruits   Caffeine: 1-2 cups of coffee, iced tea   Water: 6-8 cups daily       Wear seatbelt   Does not wear sunscreen   Smoke and carbon monoxide detectors         Social Determinants of Health   Financial Resource Strain: Medium Risk   Difficulty of Paying Living Expenses: Somewhat hard  Food Insecurity: No Food Insecurity   Worried About Charity fundraiser in the Last Year: Never true   Ran Out of Food in the Last Year: Never true  Transportation Needs: No Transportation Needs   Lack of Transportation (Medical): No   Lack of Transportation (Non-Medical): No  Physical Activity: Insufficiently Active   Days of Exercise per Week: 3 days   Minutes of Exercise per Session: 20 min  Stress: No Stress Concern Present   Feeling of Stress : Not at all  Social Connections: Socially Isolated   Frequency of Communication with Friends and Family: Once a week   Frequency of Social Gatherings with  Friends and Family: Once a week   Attends Religious Services: Never   Marine scientist or Organizations: No   Attends Archivist Meetings: Never   Marital Status: Widowed  Human resources officer Violence: Not At Risk   Fear of Current or Ex-Partner: No   Emotionally Abused: No   Physically Abused: No   Sexually Abused: No    PHYSICAL EXAM  GENERAL EXAM/CONSTITUTIONAL: Vitals:  Vitals:   12/29/20 0822  BP: (!) 179/70  Pulse: 74  Weight: 179 lb (81.2 kg)  Height: 5' (1.524 m)   Body mass index is 34.96 kg/m. Wt Readings from Last 3 Encounters:  12/29/20 179 lb (81.2  kg)  10/01/20 172 lb (78 kg)  05/14/20 171 lb (77.6 kg)   Patient is in no distress; well developed, nourished and groomed; neck is supple  CARDIOVASCULAR: Examination of carotid arteries is normal; no carotid bruits Regular rate and rhythm, no murmurs Examination of peripheral vascular system by observation and palpation is normal  EYES: Pupils round and reactive to light, Visual fields full to confrontation, Extraocular movements intacts,   MUSCULOSKELETAL: Gait, strength, tone, movements noted in Neurologic exam below  NEUROLOGIC: MENTAL STATUS:  MMSE - [place] Exam 02/06/2019  Orientation to time 5  Orientation to Place 5  Registration 3  Attention/ Calculation 5  Recall 3  Language- name 2 objects 2  Language- repeat 1  Language- follow 3 step command 3  Language- read & follow direction 1  Write a sentence 1  Copy design 1  Total score 30   awake, alert, oriented to person, place and time recent and remote memory intact normal attention and concentration language fluent, comprehension intact, naming intact fund of knowledge appropriate  CRANIAL NERVE:  2nd, 3rd, 4th, 6th - pupils equal and reactive to light, visual fields full to confrontation, extraocular muscles intact, no nystagmus 5th - facial sensation symmetric 7th - facial strength symmetric 8th - hearing  intact 9th - palate elevates symmetrically, uvula midline 11th - shoulder shrug symmetric 12th - tongue protrusion midline  MOTOR:  normal bulk and tone, full strength in the BUE, BLE  SENSORY:  normal and symmetric to light touch, pinprick, temperature, vibration  COORDINATION:  finger-nose-finger, fine finger movements normal  REFLEXES:  deep tendon reflexes present and symmetric  GAIT/STATION:  normal   DIAGNOSTIC DATA (LABS, IMAGING, TESTING) - I reviewed patient records, labs, notes, testing and imaging myself where available.  Lab Results  Component Value Date   WBC 10.0 10/01/2020   HGB 13.5 10/01/2020   HCT 39.4 10/01/2020   MCV 92 10/01/2020   PLT 347 10/01/2020      Component Value Date/Time   NA 143 10/01/2020 1002   K 4.3 10/01/2020 1002   CL 106 10/01/2020 1002   CO2 21 10/01/2020 1002   GLUCOSE 103 (H) 10/01/2020 1002   GLUCOSE 190 (H) 04/16/2020 1234   BUN 12 10/01/2020 1002   CREATININE 0.75 10/01/2020 1002   CREATININE 0.82 03/04/2020 1736   CALCIUM 9.7 10/01/2020 1002   PROT 6.3 10/01/2020 1002   ALBUMIN 4.2 10/01/2020 1002   AST 19 10/01/2020 1002   ALT 23 10/01/2020 1002   ALKPHOS 72 10/01/2020 1002   BILITOT 0.5 10/01/2020 1002   GFRNONAA 56 (L) 04/16/2020 1234   GFRNONAA 66 03/04/2020 1736   GFRAA 76 03/04/2020 1736   Lab Results  Component Value Date   CHOL 167 10/01/2020   HDL 45 10/01/2020   LDLCALC 102 (H) 10/01/2020   TRIG 108 10/01/2020   CHOLHDL 3.7 10/01/2020   Lab Results  Component Value Date   HGBA1C 5.7 (H) 10/01/2020   Lab Results  Component Value Date   VITAMINB12 339 08/02/2018   Lab Results  Component Value Date   TSH 3.440 10/01/2020      ASSESSMENT AND PLAN  87 y.o. year old female with anxiety, hypertension, history of cataract surgery, previously diagnosed with hypothyroidism who is presenting with complaint of 2 type of headaches that started since January after her cataract surgery.  The first  type of headache is pain in the lower jaw, sometime in the maxillary area.  Patient reported  this pain is daily, sometime multiple times per day.  This pain is likely trigeminal neuralgia, I will start the patient on carbamazepine 300 mg twice daily. She also describes a dull frontal headache without any migrainous feature, sometimes this headache is relieved with over-the-counter Tylenol or ibuprofen.  This likely sounds like tension type headache.  I will asked patient to continue with ibuprofen/Tylenol as needed for headache but no more than 3 days/week.  I will see her in 3 months.  Patient understands to call me if headaches not resolved.   Because she is 40 with newly diagnosed headaches, I will obtain a MRI of her brain, because of the trigeminal neuralgia I will also have MRA of her head.  Patient reported being diagnosed previously with hypothyroidism, she was previously on medication but due to elevated blood pressure her Synthroid was discontinued. She complaints of ongoing fatigue, I will order a thyroid panel for further evaluation. I will contact the patient to go over the results.     1. Trigeminal neuralgia   2. Chronic tension-type headache, not intractable   3. Other fatigue     PLAN: MRI Brain without contrast MRA head with and without contrast  Will Check TSH for chronic fatigue  Start with Carbamazepine 300 mg twice daily  Return in 3 months for follow up    Orders Placed This Encounter  Procedures   MR BRAIN WO CONTRAST   MR ANGIO HEAD WO W CONTRAST   Thyroid Panel With TSH    Meds ordered this encounter  Medications   carbamazepine (TEGRETOL) 200 MG tablet    Sig: Take 1.5 tablets (300 mg total) by mouth in the morning and at bedtime.    Dispense:  90 tablet    Refill:  6    Return in about 3 months (around 03/31/2021).    Alric Ran, MD 12/29/2020, 5:11 PM  [facility] [address] [place], [place] [address] [phone]

## 2020-12-29 NOTE — Patient Instructions (Signed)
MRI Brain  MRA head with and without contrast  Will Check TSH for chronic fatigue  Start with Carbamazepine 300 mg twice daily  Return in 3 months for follow up

## 2020-12-30 ENCOUNTER — Encounter: Payer: Self-pay | Admitting: Nurse Practitioner

## 2020-12-30 ENCOUNTER — Telehealth: Payer: Self-pay | Admitting: *Deleted

## 2020-12-30 ENCOUNTER — Other Ambulatory Visit: Payer: Self-pay

## 2020-12-30 ENCOUNTER — Ambulatory Visit (INDEPENDENT_AMBULATORY_CARE_PROVIDER_SITE_OTHER): Payer: Medicare HMO | Admitting: Nurse Practitioner

## 2020-12-30 VITALS — BP 160/80 | HR 85 | Ht 60.0 in | Wt 179.0 lb

## 2020-12-30 DIAGNOSIS — I10 Essential (primary) hypertension: Secondary | ICD-10-CM

## 2020-12-30 DIAGNOSIS — M545 Low back pain, unspecified: Secondary | ICD-10-CM | POA: Diagnosis not present

## 2020-12-30 DIAGNOSIS — G8929 Other chronic pain: Secondary | ICD-10-CM | POA: Diagnosis not present

## 2020-12-30 DIAGNOSIS — E669 Obesity, unspecified: Secondary | ICD-10-CM

## 2020-12-30 DIAGNOSIS — E038 Other specified hypothyroidism: Secondary | ICD-10-CM

## 2020-12-30 LAB — THYROID PANEL WITH TSH
Free Thyroxine Index: 2.1 (ref 1.2–4.9)
T3 Uptake Ratio: 25 % (ref 24–39)
T4, Total: 8.5 ug/dL (ref 4.5–12.0)
TSH: 5.6 u[IU]/mL — ABNORMAL HIGH (ref 0.450–4.500)

## 2020-12-30 MED ORDER — LEVOTHYROXINE SODIUM 25 MCG PO TABS
25.0000 ug | ORAL_TABLET | Freq: Every day | ORAL | 3 refills | Status: DC
Start: 2020-12-30 — End: 2021-02-03

## 2020-12-30 MED ORDER — IBUPROFEN 600 MG PO TABS: 600.0000 mg | ORAL_TABLET | Freq: Every day | ORAL | 0 refills | Status: DC | PRN

## 2020-12-30 MED ORDER — HYDROCHLOROTHIAZIDE 25 MG PO TABS: ORAL_TABLET | ORAL | 1 refills | Status: DC

## 2020-12-30 MED ORDER — AMLODIPINE BESYLATE 5 MG PO TABS: 5.0000 mg | ORAL_TABLET | Freq: Every day | ORAL | 3 refills | Status: DC

## 2020-12-30 NOTE — Assessment & Plan Note (Signed)
Importance of regular exercise as tolerated discussed with pt.  Importance of portion control and healthy food choices discussed.  Wt Readings from Last 3 Encounters:  12/30/20 179 lb (81.2 kg)  12/29/20 179 lb (81.2 kg)  10/01/20 172 lb (78 kg)

## 2020-12-30 NOTE — Assessment & Plan Note (Addendum)
Pt has stopped taking NP thyroid. Pt c/o feeling tired , slow  and gaining more weight  Lab Results  Component Value Date   TSH 5.600 (H) 12/29/2020   T4TOTAL 8.5 12/29/2020  start taking synthroid 81mcg daily and follow up in January for recheck TSH level. Pt instructed to take med on empty stomach and wait one hour before eating breakfast.

## 2020-12-30 NOTE — Telephone Encounter (Signed)
-----   Message from Alric Ran, MD sent at 12/30/2020  9:34 AM EST ----- Please call and inform patient that her thyroid test is abnormal and that she should call and try to get an appointment with Dr. Posey Pronto.   Thank you  Alric Ran, MD

## 2020-12-30 NOTE — Assessment & Plan Note (Signed)
BP Readings from Last 3 Encounters:  12/30/20 (!) 160/80  12/29/20 (!) 179/70  10/01/20 (!) 154/79  pt has been taking only lisinorpil 40mg  daily. Pt told to start taking amlodipine 5mg  and HTCZ 25mg  as ordered. Avoid salty food and exercise as tolerated.

## 2020-12-30 NOTE — Telephone Encounter (Signed)
Spoke with patient's son (as listed on [place]). Informed him of abnormal thyroid test and to follow up with Dr. Posey Pronto. Patient's son verbalized understanding and will relay the message. All questions answered.

## 2020-12-30 NOTE — Progress Notes (Signed)
   Nancy Schroeder     MRN: [id number]      DOB: 1933-06-02   HPI Nancy Schroeder is here for follow up and re-evaluation of chronic medical conditions, medication management and review of any available recent lab and radiology data.  Preventive health is updated, specifically  Cancer screening and Immunization.   Questions or concerns regarding consultations or procedures which the PT has had in the interim are  addressed. The PT denies any adverse reactions to current medications since the last visit.   PT complained of felling tired and having low energy for the past few months, her family thinks that she is lazy, thyroid labs done yesterday showed elevated TSH. She was taking NP thyroid but stopped taking it because it made her blood pressure go up.   PT  c/o chronic low back pain since two years ago when she got injured at work. She has trouble bending down to pick things. Tylenol has not been helping her pain. Has aching pain 7/10.    ROS Denies recent fever or chills. Feels fatigued and tired most of the time.  Denies sinus pressure, nasal congestion, ear pain or sore throat. Denies chest congestion, productive cough or wheezing. Denies chest pains, palpitations and leg swelling Denies abdominal pain, nausea, vomiting,diarrhea or constipation.   Denies dysuria, frequency, hesitancy or incontinence. Has right sided low back pain,and limitation in mobility of her waist.  Denies headaches, seizures, numbness, or tingling. Denies depression, anxiety or insomnia. Denies skin break down or rash.   PE  BP (!) 160/80   Pulse 85   Ht 5' (1.524 m)   Wt 179 lb (81.2 kg)   SpO2 96%   BMI 34.96 kg/m   Patient alert and oriented and in no cardiopulmonary distress.  HEENT: No facial asymmetry, EOMI,     Neck supple .  Chest: Clear to auscultation bilaterally.  CVS: S1, S2 no murmurs, no S3.Regular rate.  ABD: Soft non tender.   Ext: No edema  MS: Tenderness on palpation of  right lower back , limited ROM spine, Able to move shoulders, hips and knees.  Skin: Intact, no ulcerations or rash noted.  Psych: Good eye contact, normal affect. Memory intact not anxious or depressed appearing.  CNS: CN 2-12 intact, power,  normal throughout.no focal deficits noted.   Assessment & Plan

## 2020-12-30 NOTE — Telephone Encounter (Signed)
Left message requesting a return call.

## 2020-12-30 NOTE — Patient Instructions (Signed)
Follow up in January   Please start taking  synthroid 25mg  daily for your hypothyroidism.  Start taking your amlodipine 5mg  once a day and hydrochlorothiazide 25mg  once daily for blood pressure. Continue to take your lisinopril 40mg  once daily as well.   Use ibuprofen 600mg  once daily for your low back pain. Get your COVID AND shingles vaccine at your pharmacy.   It is important that you exercise regularly at least 30 minutes 5 times a week.  Think about what you will eat, plan ahead. Choose " clean, green, fresh or frozen" over canned, processed or packaged foods which are more sugary, salty and fatty. 70 to 75% of food eaten should be vegetables and fruit. Three meals at set times with snacks allowed between meals, but they must be fruit or vegetables. Aim to eat over a 12 hour period , example 7 am to 7 pm, and STOP after  your last meal of the day. Drink water,generally about 64 ounces per day, no other drink is as healthy. Fruit juice is best enjoyed in a healthy way, by EATING the fruit.  Thanks for choosing [facility], we consider it a privelige to serve you.

## 2020-12-30 NOTE — Assessment & Plan Note (Signed)
Ibuprofen 600mg  daily as needed. Importance of eating food before taking med discussed with pt

## 2020-12-30 NOTE — Progress Notes (Signed)
Please call and inform patient that her thyroid test is abnormal and that she should call and try to get an appointment with Dr. Posey Pronto.   Thank you  Alric Ran, MD

## 2021-01-07 ENCOUNTER — Other Ambulatory Visit: Payer: Self-pay

## 2021-01-07 ENCOUNTER — Ambulatory Visit (HOSPITAL_COMMUNITY)
Admission: RE | Admit: 2021-01-07 | Discharge: 2021-01-07 | Disposition: A | Discharge status: Home / Self Care | Payer: Medicare HMO | Source: Ambulatory Visit | Attending: Neurology | Admitting: Neurology

## 2021-01-07 DIAGNOSIS — G500 Trigeminal neuralgia: Secondary | ICD-10-CM | POA: Insufficient documentation

## 2021-01-07 DIAGNOSIS — R519 Headache, unspecified: Secondary | ICD-10-CM | POA: Diagnosis not present

## 2021-01-07 DIAGNOSIS — G44229 Chronic tension-type headache, not intractable: Secondary | ICD-10-CM

## 2021-01-07 DIAGNOSIS — Z8673 Personal history of transient ischemic attack (TIA), and cerebral infarction without residual deficits: Secondary | ICD-10-CM | POA: Diagnosis not present

## 2021-01-07 DIAGNOSIS — R262 Difficulty in walking, not elsewhere classified: Secondary | ICD-10-CM | POA: Diagnosis not present

## 2021-01-07 DIAGNOSIS — I6782 Cerebral ischemia: Secondary | ICD-10-CM | POA: Diagnosis not present

## 2021-01-08 ENCOUNTER — Telehealth: Payer: Self-pay

## 2021-01-08 NOTE — Telephone Encounter (Signed)
Spoke with patient and informed of results. Patient verbalized understanding and expressed appreciation for the call. Told to keep follow up. All questions answered.

## 2021-01-08 NOTE — Telephone Encounter (Signed)
-----   Message from Gildardo Griffes, RN sent at 01/08/2021  3:56 PM EST ----- Pt of Dr April Manson

## 2021-02-03 ENCOUNTER — Encounter (INDEPENDENT_AMBULATORY_CARE_PROVIDER_SITE_OTHER): Payer: Self-pay

## 2021-02-03 ENCOUNTER — Other Ambulatory Visit: Payer: Self-pay

## 2021-02-03 ENCOUNTER — Ambulatory Visit (INDEPENDENT_AMBULATORY_CARE_PROVIDER_SITE_OTHER): Payer: Medicare HMO | Admitting: Internal Medicine

## 2021-02-03 ENCOUNTER — Encounter: Payer: Self-pay | Admitting: Internal Medicine

## 2021-02-03 VITALS — BP 132/58 | HR 76 | Resp 18 | Ht 60.0 in | Wt 176.1 lb

## 2021-02-03 DIAGNOSIS — M545 Low back pain, unspecified: Secondary | ICD-10-CM

## 2021-02-03 DIAGNOSIS — G8929 Other chronic pain: Secondary | ICD-10-CM | POA: Diagnosis not present

## 2021-02-03 DIAGNOSIS — E038 Other specified hypothyroidism: Secondary | ICD-10-CM

## 2021-02-03 DIAGNOSIS — G500 Trigeminal neuralgia: Secondary | ICD-10-CM

## 2021-02-03 DIAGNOSIS — I10 Essential (primary) hypertension: Secondary | ICD-10-CM

## 2021-02-03 MED ORDER — IBUPROFEN 600 MG PO TABS: 600.0000 mg | ORAL_TABLET | Freq: Every day | ORAL | 0 refills | Status: DC | PRN

## 2021-02-03 NOTE — Progress Notes (Signed)
Established Patient Office Visit  Subjective:  Patient ID: Nancy Schroeder, female    DOB: [birth year]  Age: 88 y.o. MRN: [id number]  CC:  Chief Complaint  Patient presents with   Follow-up    Follow up     HPI Nancy Schroeder is a 88 y.o. female with past medical history of HTN, subclinical hypothyroidism, trigeminal neuralgia and obesity who presents for f/u of her chronic medical conditions.  HTN: BP is well-controlled. Takes medications regularly. Patient denies headache, dizziness, chest pain, dyspnea or palpitations.  Chronic fatigue and subclinical hypothyroidism: Her TSH was borderline elevated in 12/2020 and was started on Levothyroxine by Edwin Dada, NP, but she has not started taking it yet.  She is afraid as her BP used to run very high with NP thyroid in the past.  She has chronic fatigue, which is unchanged compared to prior.  Trigeminal neuralgia: She was started on Tegretol in the last visit.  She had a visit with Neurologist and was placed on higher dose of Tegretol.  She could not tolerate higher dose and was having dizziness with it.  She currently takes Tegretol 200 mg QD.  Pain episodes have decreased in frequency and intensity now with it.  She denies any recent changes in vision.  She complains of chronic right-sided low back pain, but denies any numbness or tingling of the feet.  She had a fall few weeks ago at a local grocery store due to dizziness while she was taking higher dose of Tegretol.  Her back pain from that fall has improved now.  Past Medical History:  Diagnosis Date   Anxiety    Depression    Essential hypertension    H/O back injury 2016    Past Surgical History:  Procedure Laterality Date   ABDOMINAL HYSTERECTOMY     total   CATARACT EXTRACTION, BILATERAL  01/2020   CHOLECYSTECTOMY     NECK SURGERY      Family History  Problem Relation Age of Onset   Diabetes Mother    Diabetes Father    Diabetes Sister     Social  History   Socioeconomic History   Marital status: Widowed    Spouse name: Not on file   Number of children: 3   Years of education: Not on file   Highest education level: 8th grade  Occupational History   Occupation: retired  Tobacco Use   Smoking status: Never   Smokeless tobacco: Never  Vaping Use   Vaping Use: Never used  Substance and Sexual Activity   Alcohol use: Yes    Comment: 1-2 drinks per 6 months   Drug use: Never   Sexual activity: Not Currently    Birth control/protection: Surgical, Post-menopausal  Other Topics Concern   Not on file  Social History Narrative   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 Read, tv, putt around, likes astrology      Diet: Eats what she wants. Enjoys veggies and fruits   Caffeine: 1-2 cups of coffee, iced tea   Water: 6-8 cups daily       Wear seatbelt   Does not wear sunscreen   Smoke and carbon monoxide detectors         Social Determinants of  Health   Financial Resource Strain: Medium Risk   Difficulty of Paying Living Expenses: Somewhat hard  Food Insecurity: No Food Insecurity   Worried About Running Out of Food in the Last Year: Never true   Ran Out of Food in the Last Year: Never true  Transportation Needs: No Transportation Needs   Lack of Transportation (Medical): No   Lack of Transportation (Non-Medical): No  Physical Activity: Insufficiently Active   Days of Exercise per Week: 3 days   Minutes of Exercise per Session: 20 min  Stress: No Stress Concern Present   Feeling of Stress : Not at all  Social Connections: Socially Isolated   Frequency of Communication with Friends and Family: Once a week   Frequency of Social Gatherings with Friends and Family: Once a week   Attends Religious Services: Never   Automotive engineer or Organizations: No   Attends Banker Meetings: Never   Marital Status: Widowed  Catering manager Violence: Not At Risk   Fear of Current or Ex-Partner: No   Emotionally Abused: No   Physically Abused: No   Sexually Abused: No    Outpatient Medications Prior to Visit  Medication Sig Dispense Refill   amLODipine (NORVASC) 5 MG tablet Take 1 tablet (5 mg total) by mouth daily. 90 tablet 3   aspirin EC 81 MG tablet Take 81 mg by mouth daily. Swallow whole.     busPIRone (BUSPAR) 10 MG tablet Take 1 tablet (10 mg total) by mouth daily as needed (for mood). 180 tablet 1   diphenhydrAMINE (BENADRYL) 25 mg capsule Take 25 mg by mouth at bedtime as needed.     hydrochlorothiazide (HYDRODIURIL) 25 MG tablet TAKE 1 TABLET(25 MG) BY MOUTH DAILY 90 tablet 1   lisinopril (ZESTRIL) 40 MG tablet TAKE 1 TABLET(40 MG) BY MOUTH DAILY 90 tablet 1   Psyllium (METAMUCIL FIBER PO) Take 2 capsules by mouth 3 (three) times daily.     carbamazepine (TEGRETOL) 200 MG tablet Take 1.5 tablets (300 mg total) by mouth in the morning and at bedtime. (Patient not taking: Reported on 12/30/2020) 90 tablet 6   ibuprofen (ADVIL) 600 MG tablet Take 1 tablet (600 mg total) by mouth daily as needed. (Patient not taking: Reported on 02/03/2021) 30 tablet 0   levothyroxine (SYNTHROID) 25 MCG tablet Take 1 tablet (25 mcg total) by mouth daily. (Patient not taking: Reported on 02/03/2021) 90 tablet 3   pantoprazole (PROTONIX) 40 MG tablet Take 1 tablet (40 mg total) by mouth daily. (Patient not taking: Reported on 02/03/2021) 30 tablet 3   No facility-administered medications prior to visit.    No Active Allergies  ROS Review of Systems  Constitutional:  Positive for fatigue. Negative for chills and fever.  HENT:  Negative for sinus pressure, sinus pain and sore throat.   Eyes:  Negative for pain and discharge.  Respiratory:  Negative for cough and shortness of breath.   Cardiovascular:  Negative  for chest pain and palpitations.  Gastrointestinal:  Negative for abdominal pain, constipation, diarrhea, nausea and vomiting.  Endocrine: Negative for polydipsia and polyuria.  Genitourinary:  Negative for dysuria and hematuria.  Musculoskeletal:  Positive for back pain. Negative for neck pain and neck stiffness.  Skin:  Negative for rash.  Neurological:  Positive for headaches. Negative for dizziness and weakness.  Psychiatric/Behavioral:  Negative for agitation and behavioral problems.      Objective:    Physical Exam Vitals reviewed.  Constitutional:  General: She is not in acute distress.    Appearance: She is not diaphoretic.  HENT:     Head: Normocephalic and atraumatic.     Nose: No congestion.     Mouth/Throat:     Mouth: Mucous membranes are moist.     Pharynx: No posterior oropharyngeal erythema.  Eyes:     General: No scleral icterus.    Extraocular Movements: Extraocular movements intact.  Cardiovascular:     Rate and Rhythm: Normal rate and regular rhythm.     Pulses: Normal pulses.     Heart sounds: Normal heart sounds. No murmur heard. Pulmonary:     Breath sounds: Normal breath sounds. No wheezing or rales.  Musculoskeletal:        General: Tenderness (Right paraspinal (lumbar) area) present.     Cervical back: Neck supple. No tenderness.     Right lower leg: No edema.     Left lower leg: No edema.  Skin:    General: Skin is warm.     Findings: No rash.  Neurological:     General: No focal deficit present.     Mental Status: She is alert and oriented to person, place, and time.     Sensory: No sensory deficit.     Motor: No weakness.  Psychiatric:        Mood and Affect: Mood normal.        Behavior: Behavior normal.    BP (!) 132/58 (BP Location: Left Arm, Patient Position: Sitting, Cuff Size: Normal)    Pulse 76    Resp 18    Ht 5' (1.524 m)    Wt 176 lb 1.9 oz (79.9 kg)    SpO2 100%    BMI 34.40 kg/m  Wt Readings from Last 3 Encounters:   02/03/21 176 lb 1.9 oz (79.9 kg)  12/30/20 179 lb (81.2 kg)  12/29/20 179 lb (81.2 kg)    Lab Results  Component Value Date   TSH 5.600 (H) 12/29/2020   Lab Results  Component Value Date   WBC 10.0 10/01/2020   HGB 13.5 10/01/2020   HCT 39.4 10/01/2020   MCV 92 10/01/2020   PLT 347 10/01/2020   Lab Results  Component Value Date   NA 143 10/01/2020   K 4.3 10/01/2020   CO2 21 10/01/2020   GLUCOSE 103 (H) 10/01/2020   BUN 12 10/01/2020   CREATININE 0.75 10/01/2020   BILITOT 0.5 10/01/2020   ALKPHOS 72 10/01/2020   AST 19 10/01/2020   ALT 23 10/01/2020   PROT 6.3 10/01/2020   ALBUMIN 4.2 10/01/2020   CALCIUM 9.7 10/01/2020   ANIONGAP 10 04/16/2020   EGFR 78 10/01/2020   Lab Results  Component Value Date   CHOL 167 10/01/2020   Lab Results  Component Value Date   HDL 45 10/01/2020   Lab Results  Component Value Date   LDLCALC 102 (H) 10/01/2020   Lab Results  Component Value Date   TRIG 108 10/01/2020   Lab Results  Component Value Date   CHOLHDL 3.7 10/01/2020   Lab Results  Component Value Date   HGBA1C 5.7 (H) 10/01/2020      Assessment & Plan:   Problem List Items Addressed This Visit       Cardiovascular and Mediastinum   Essential hypertension - Primary    BP Readings from Last 1 Encounters:  02/03/21 (!) 132/58  Well-controlled with Amlodipine and Lisinopril Counseled for compliance with the medications Advised DASH diet and  moderate exercise/walking as tolerated      Relevant Orders   Basic Metabolic Panel (BMET)     Endocrine   Subclinical hypothyroidism    Check TSH and free T4 Had stopped NP thyroid now, was started on Levothyroxine 25 mcg QD by NP, but she has not started taking it - c/o fatigue      Relevant Orders   TSH + free T4     Nervous and Auditory   Trigeminal neuralgia pain    Headache description likely suggestive of trigeminal neuralgia Takes Carbamazepine 200 mg QD, was actually put on 300 mg BID dose,  but she had dizziness with that dose        Other   Chronic right-sided low back pain without sciatica   Relevant Medications   ibuprofen (ADVIL) 600 MG tablet    Meds ordered this encounter  Medications   ibuprofen (ADVIL) 600 MG tablet    Sig: Take 1 tablet (600 mg total) by mouth daily as needed.    Dispense:  30 tablet    Refill:  0    Follow-up: Return in about 3 months (around 05/04/2021) for Hypothyroidism and HTN.    Lindell Spar, MD

## 2021-02-03 NOTE — Patient Instructions (Signed)
Please continue taking medications as prescribed.  Please continue to take at least 64 ounces of fluid in a day.  Okay to take Ibuprofen for moderate pain, avoid daily dosing. Okay to take Tylenol as needed for pain in between. Avoid heavy lifting and frequent bending.

## 2021-02-03 NOTE — Assessment & Plan Note (Signed)
Check TSH and free T4 Had stopped NP thyroid now, was started on Levothyroxine 25 mcg QD by NP, but she has not started taking it - c/o fatigue

## 2021-02-03 NOTE — Assessment & Plan Note (Signed)
Headache description likely suggestive of trigeminal neuralgia Takes Carbamazepine 200 mg QD, was actually put on 300 mg BID dose, but she had dizziness with that dose

## 2021-02-03 NOTE — Assessment & Plan Note (Signed)
BP Readings from Last 1 Encounters:  02/03/21 (!) 132/58   Well-controlled with Amlodipine and Lisinopril Counseled for compliance with the medications Advised DASH diet and moderate exercise/walking as tolerated

## 2021-02-04 ENCOUNTER — Ambulatory Visit: Payer: Medicare HMO | Admitting: Nurse Practitioner

## 2021-02-04 LAB — TSH+FREE T4
Free T4: 1.17 ng/dL (ref 0.82–1.77)
TSH: 6.66 u[IU]/mL — ABNORMAL HIGH (ref 0.450–4.500)

## 2021-02-04 LAB — BASIC METABOLIC PANEL
BUN/Creatinine Ratio: 14 (ref 12–28)
BUN: 12 mg/dL (ref 8–27)
CO2: 20 mmol/L (ref 20–29)
Calcium: 9.4 mg/dL (ref 8.7–10.3)
Chloride: 106 mmol/L (ref 96–106)
Creatinine, Ser: 0.88 mg/dL (ref 0.57–1.00)
Glucose: 118 mg/dL — ABNORMAL HIGH (ref 70–99)
Potassium: 4.4 mmol/L (ref 3.5–5.2)
Sodium: 140 mmol/L (ref 134–144)
eGFR: 64 mL/min/{1.73_m2} (ref >59)

## 2021-02-05 ENCOUNTER — Telehealth: Payer: Self-pay | Admitting: Internal Medicine

## 2021-02-05 NOTE — Telephone Encounter (Signed)
Return call for blood work results

## 2021-02-05 NOTE — Telephone Encounter (Signed)
Pt advised with verbal understanding  °

## 2021-02-17 ENCOUNTER — Ambulatory Visit: Payer: Medicare HMO | Admitting: Podiatry

## 2021-02-17 ENCOUNTER — Other Ambulatory Visit: Payer: Self-pay

## 2021-02-17 ENCOUNTER — Encounter: Payer: Self-pay | Admitting: Podiatry

## 2021-02-17 DIAGNOSIS — B351 Tinea unguium: Secondary | ICD-10-CM | POA: Diagnosis not present

## 2021-02-17 DIAGNOSIS — M79674 Pain in right toe(s): Secondary | ICD-10-CM

## 2021-02-17 DIAGNOSIS — I739 Peripheral vascular disease, unspecified: Secondary | ICD-10-CM

## 2021-02-17 DIAGNOSIS — M79675 Pain in left toe(s): Secondary | ICD-10-CM | POA: Diagnosis not present

## 2021-02-22 NOTE — Progress Notes (Signed)
Subjective: Nancy Schroeder is a 88 y.o. female patient seen today for follow up of  for at risk foot care. Patient has h/o PAD and painful elongated mycotic toenails 1-5 bilaterally which are tender when wearing enclosed shoe gear. Pain is relieved with periodic professional debridement..   New problems reported today: None.  PCP is Lindell Spar, MD. Last visit was: 02/03/2021.  No Active Allergies  Objective: Physical Exam  General: Patient is a pleasant 88 y.o. Caucasian female WD, WN in NAD. AAO x 3.   Neurovascular Examination: CFT delayed b/l LE. Faintly palpable pedal pulses b/l. Pedal hair absent. No pain with calf compression b/l. No edema noted b/l LE. No cyanosis or clubbing noted b/l LE.  Protective sensation intact 5/5 intact bilaterally with 10g monofilament b/l. Vibratory sensation intact b/l.  Dermatological:  Pedal integument with normal turgor, texture and tone b/l LE. No open wounds b/l. No interdigital macerations b/l. Toenails 1-5 b/l elongated, thickened, discolored with subungual debris. +Tenderness with dorsal palpation of nailplates. No hyperkeratotic or porokeratotic lesions present.  Musculoskeletal:  Normal muscle strength 5/5 to all lower extremity muscle groups bilaterally. HAV with bunion deformity noted b/l LE. Hammertoe deformity noted 2-5 b/l.Marland Kitchen No pain, crepitus or joint limitation noted with ROM b/l LE.  Patient ambulates independently without assistive aids.  Assessment: 1. Pain due to onychomycosis of toenails of both feet   2. Peripheral vascular disease ([place])    Plan: Patient was evaluated and treated and all questions answered. Consent given for treatment as described below: -Toenails 1-5 bilaterally were debrided in length and girth with sterile nail nippers and dremel. Pinpoint bleeding of L hallux addressed with Lumicain Hemostatic Solution, cleansed with alcohol. triple antibiotic ointment applied. No further treatment required by  patient. -Patient/POA to call should there be question/concern in the interim.  Return in about 3 months (around 05/18/2021).  Marzetta Board, DPM

## 2021-03-16 ENCOUNTER — Other Ambulatory Visit: Payer: Self-pay | Admitting: *Deleted

## 2021-03-16 DIAGNOSIS — F341 Dysthymic disorder: Secondary | ICD-10-CM

## 2021-03-16 MED ORDER — BUSPIRONE HCL 10 MG PO TABS: 10.0000 mg | ORAL_TABLET | Freq: Every day | ORAL | 1 refills | Status: DC | PRN

## 2021-04-09 ENCOUNTER — Ambulatory Visit: Payer: Medicare HMO | Admitting: Neurology

## 2021-05-04 ENCOUNTER — Encounter: Payer: Self-pay | Admitting: Internal Medicine

## 2021-05-04 ENCOUNTER — Ambulatory Visit (INDEPENDENT_AMBULATORY_CARE_PROVIDER_SITE_OTHER): Payer: Medicare HMO | Admitting: Internal Medicine

## 2021-05-04 VITALS — BP 122/78 | HR 86 | Resp 18 | Ht 60.0 in | Wt 176.8 lb

## 2021-05-04 DIAGNOSIS — I10 Essential (primary) hypertension: Secondary | ICD-10-CM

## 2021-05-04 DIAGNOSIS — L259 Unspecified contact dermatitis, unspecified cause: Secondary | ICD-10-CM

## 2021-05-04 DIAGNOSIS — E038 Other specified hypothyroidism: Secondary | ICD-10-CM

## 2021-05-04 DIAGNOSIS — G500 Trigeminal neuralgia: Secondary | ICD-10-CM

## 2021-05-04 DIAGNOSIS — M5416 Radiculopathy, lumbar region: Secondary | ICD-10-CM | POA: Diagnosis not present

## 2021-05-04 DIAGNOSIS — E039 Hypothyroidism, unspecified: Secondary | ICD-10-CM

## 2021-05-04 DIAGNOSIS — D229 Melanocytic nevi, unspecified: Secondary | ICD-10-CM

## 2021-05-04 MED ORDER — TRIAMCINOLONE ACETONIDE 0.1 % EX CREA: 1.0000 "application " | TOPICAL_CREAM | Freq: Two times a day (BID) | CUTANEOUS | 0 refills | Status: DC

## 2021-05-04 MED ORDER — TRAMADOL HCL 50 MG PO TABS: 50.0000 mg | ORAL_TABLET | Freq: Two times a day (BID) | ORAL | 0 refills | Status: DC | PRN

## 2021-05-04 NOTE — Patient Instructions (Signed)
Please take Tramadol for severe pain only. It may cause drowsiness. If it causes dizziness, do not take it again. Okay to take Tylenol up to 3 times in a day. ? ?Please use Kenalog cream for leg rash. ? ?Please schedule appointment with Neurology to discuss about jerking in the leg. ?

## 2021-05-05 LAB — BASIC METABOLIC PANEL
BUN/Creatinine Ratio: 22 (ref 12–28)
BUN: 22 mg/dL (ref 8–27)
CO2: 20 mmol/L (ref 20–29)
Calcium: 9.3 mg/dL (ref 8.7–10.3)
Chloride: 106 mmol/L (ref 96–106)
Creatinine, Ser: 0.99 mg/dL (ref 0.57–1.00)
Glucose: 116 mg/dL — ABNORMAL HIGH (ref 70–99)
Potassium: 4.2 mmol/L (ref 3.5–5.2)
Sodium: 141 mmol/L (ref 134–144)
eGFR: 56 mL/min/{1.73_m2} — ABNORMAL LOW (ref >59)

## 2021-05-05 LAB — TSH+FREE T4
Free T4: 1.34 ng/dL (ref 0.82–1.77)
TSH: 4.08 u[IU]/mL (ref 0.450–4.500)

## 2021-05-07 DIAGNOSIS — M5416 Radiculopathy, lumbar region: Secondary | ICD-10-CM | POA: Insufficient documentation

## 2021-05-07 DIAGNOSIS — L259 Unspecified contact dermatitis, unspecified cause: Secondary | ICD-10-CM | POA: Insufficient documentation

## 2021-05-07 DIAGNOSIS — D229 Melanocytic nevi, unspecified: Secondary | ICD-10-CM | POA: Insufficient documentation

## 2021-05-07 NOTE — Assessment & Plan Note (Signed)
Bilateral leg redness, trial of Kenalog ?

## 2021-05-07 NOTE — Assessment & Plan Note (Signed)
BP Readings from Last 1 Encounters:  ?05/04/21 122/78  ? ?Well-controlled with Amlodipine and Lisinopril ?Counseled for compliance with the medications ?Advised DASH diet and moderate exercise/walking as tolerated ?

## 2021-05-07 NOTE — Assessment & Plan Note (Addendum)
Check TSH and free T4 ?Had stopped NP thyroid now, was started on Levothyroxine 25 mcg QD ?

## 2021-05-07 NOTE — Assessment & Plan Note (Signed)
Chronic low back pain with radiation to the LE ?Uncontrolled with ibuprofen ?Advised to avoid daily oral NSAIDs ?Advised to wear back brace and/or apply heating pad as needed ?Tramadol as needed for severe back pain ? ?

## 2021-05-07 NOTE — Assessment & Plan Note (Signed)
Appears to be benign mole, but she has itching/irritation ?Referred to dermatology ?

## 2021-05-07 NOTE — Assessment & Plan Note (Addendum)
Headache description likely suggestive of trigeminal neuralgia ?Has stopped taking Tegretol ?Has been evaluated by neurology, advised to contact neurology to discuss about trigeminal neuralgia and about her left foot twitching ?

## 2021-05-07 NOTE — Progress Notes (Signed)
? ?Established Patient Office Visit ? ?Subjective:  ?Patient ID: Nancy Schroeder, female    DOB: 06-Aug-1933  Age: 88 y.o. MRN: [id number] ? ?CC:  ?Chief Complaint  ?Patient presents with  ? Follow-up  ?  3 month follow up Hypothyroidism and HTN pt has been having issues out of left foot with nerves. This has been going on since 1990 ?  ? ? ?HPI ?Nancy Schroeder is a 88 y.o. female with past medical history of HTN, hypothyroidism, trigeminal neuralgia and obesity who presents for f/u of her chronic medical conditions. ? ?HTN: BP is well-controlled. Takes medications regularly. Patient denies headache, dizziness, chest pain, dyspnea or palpitations. ? ?Chronic fatigue and subclinical hypothyroidism: Her TSH was borderline elevated in 12/2020 and was started on Levothyroxine.  She has started taking her levothyroxine since the last visit.  Her fatigue has slightly improved since then.  Her weight has been stable as well. ? ?Trigeminal neuralgia: She was started on Tegretol, and had visit with neurologist, but she has stopped taking Tegretol now as she is concerned about its effects on her brain.  She continues to have facial area pain on her right side. ? ?She complains of chronic low back pain, which is worse with movement.  She has tried taking ibuprofen with some relief.  She used to take Norco as needed for severe back pain. ? ?Today, she also complains of left foot pain and twitching movements of her toes.  She agrees to contact neurology office for further evaluation. ? ?She also reports having a mole near her left eye, which has been chronic.  She has itching over the area.  She requests a dermatology referral for it. ? ? ?Past Medical History:  ?Diagnosis Date  ? Anxiety   ? Depression   ? Essential hypertension   ? H/O back injury 2016  ? ? ?Past Surgical History:  ?Procedure Laterality Date  ? ABDOMINAL HYSTERECTOMY    ? total  ? CATARACT EXTRACTION, BILATERAL  01/2020  ? CHOLECYSTECTOMY    ? NECK  SURGERY    ? ? ?Family History  ?Problem Relation Age of Onset  ? Diabetes Mother   ? Diabetes Father   ? Diabetes Sister   ? ? ?Social History  ? ?Socioeconomic History  ? Marital status: Widowed  ?  Spouse name: Not on file  ? Number of children: 3  ? Years of education: Not on file  ? Highest education level: 8th grade  ?Occupational History  ? Occupation: retired  ?Tobacco Use  ? Smoking status: Never  ? Smokeless tobacco: Never  ?Vaping Use  ? Vaping Use: Never used  ?Substance and Sexual Activity  ? Alcohol use: Yes  ?  Comment: 1-2 drinks per 6 months  ? Drug use: Never  ? Sexual activity: Not Currently  ?  Birth control/protection: Surgical, Post-menopausal  ?Other Topics Concern  ? Not on file  ?Social History Narrative  ? Live with Wally-son (middle)  ? Has 3 sons  ? Oldest lives in [place]  ? Youngest lives in [place] (with 1 grandchild)  ? 3 cats: lily-16 , troubles, whiskers (boys-5 years)  ?   ? Enjoyed sewing, and knitting, but back injury made this worse. Read, tv, putt around, likes astrology  ?   ? Diet: Eats what she wants. Enjoys veggies and fruits  ? Caffeine: 1-2 cups of coffee, iced tea  ? Water: 6-8 cups daily  ?    ? Wear seatbelt  ? Does not  wear sunscreen  ? Smoke and carbon monoxide detectors  ?   ?   ? ?Social Determinants of Health  ? ?Financial Resource Strain: Medium Risk  ? Difficulty of Paying Living Expenses: Somewhat hard  ?Food Insecurity: No Food Insecurity  ? Worried About Charity fundraiser in the Last Year: Never true  ? Ran Out of Food in the Last Year: Never true  ?Transportation Needs: No Transportation Needs  ? Lack of Transportation (Medical): No  ? Lack of Transportation (Non-Medical): No  ?Physical Activity: Insufficiently Active  ? Days of Exercise per Week: 3 days  ? Minutes of Exercise per Session: 20 min  ?Stress: No Stress Concern Present  ? Feeling of Stress : Not at all  ?Social Connections: Socially Isolated  ? Frequency of Communication with Friends and  Family: Once a week  ? Frequency of Social Gatherings with Friends and Family: Once a week  ? Attends Religious Services: Never  ? Active Member of Clubs or Organizations: No  ? Attends Archivist Meetings: Never  ? Marital Status: Widowed  ?Intimate Partner Violence: Not At Risk  ? Fear of Current or Ex-Partner: No  ? Emotionally Abused: No  ? Physically Abused: No  ? Sexually Abused: No  ? ? ?Outpatient Medications Prior to Visit  ?Medication Sig Dispense Refill  ? amLODipine (NORVASC) 5 MG tablet Take 1 tablet (5 mg total) by mouth daily. 90 tablet 3  ? aspirin EC 81 MG tablet Take 81 mg by mouth daily. Swallow whole.    ? busPIRone (BUSPAR) 10 MG tablet Take 1 tablet (10 mg total) by mouth daily as needed (for mood). 180 tablet 1  ? diphenhydrAMINE (BENADRYL) 25 mg capsule Take 25 mg by mouth at bedtime as needed.    ? hydrochlorothiazide (HYDRODIURIL) 25 MG tablet TAKE 1 TABLET(25 MG) BY MOUTH DAILY 90 tablet 1  ? ibuprofen (ADVIL) 600 MG tablet Take 1 tablet (600 mg total) by mouth daily as needed. 30 tablet 0  ? levothyroxine (SYNTHROID) 25 MCG tablet Take 25 mcg by mouth daily before breakfast.    ? lisinopril (ZESTRIL) 40 MG tablet TAKE 1 TABLET(40 MG) BY MOUTH DAILY 90 tablet 1  ? Psyllium (METAMUCIL FIBER PO) Take 2 capsules by mouth 3 (three) times daily.    ? carbamazepine (TEGRETOL) 200 MG tablet Take 1.5 tablets (300 mg total) by mouth in the morning and at bedtime. (Patient not taking: Reported on 12/30/2020) 90 tablet 6  ? ?No facility-administered medications prior to visit.  ? ? ?No Active Allergies ? ?ROS ?Review of Systems  ?Constitutional:  Positive for fatigue. Negative for chills and fever.  ?HENT:  Negative for sinus pressure, sinus pain and sore throat.   ?Eyes:  Negative for pain and discharge.  ?Respiratory:  Negative for cough and shortness of breath.   ?Cardiovascular:  Negative for chest pain and palpitations.  ?Gastrointestinal:  Negative for abdominal pain, constipation,  diarrhea, nausea and vomiting.  ?Endocrine: Negative for polydipsia and polyuria.  ?Genitourinary:  Negative for dysuria and hematuria.  ?Musculoskeletal:  Positive for back pain. Negative for neck pain and neck stiffness.  ?Skin:  Positive for rash.  ?     Mole near left eye  ?Neurological:  Negative for dizziness and weakness.  ?     Twitching of left foot toes  ?Psychiatric/Behavioral:  Negative for agitation and behavioral problems.   ? ?  ?Objective:  ?  ?Physical Exam ?Vitals reviewed.  ?Constitutional:   ?  General: She is not in acute distress. ?   Appearance: She is not diaphoretic.  ?HENT:  ?   Head: Normocephalic and atraumatic.  ?   Nose: No congestion.  ?   Mouth/Throat:  ?   Mouth: Mucous membranes are moist.  ?   Pharynx: No posterior oropharyngeal erythema.  ?Eyes:  ?   General: No scleral icterus. ?   Extraocular Movements: Extraocular movements intact.  ?Cardiovascular:  ?   Rate and Rhythm: Normal rate and regular rhythm.  ?   Pulses: Normal pulses.  ?   Heart sounds: Normal heart sounds. No murmur heard. ?Pulmonary:  ?   Breath sounds: Normal breath sounds. No wheezing or rales.  ?Musculoskeletal:     ?   General: Tenderness (Right paraspinal (lumbar) area) present.  ?   Cervical back: Neck supple. No tenderness.  ?   Right lower leg: No edema.  ?   Left lower leg: No edema.  ?Skin: ?   General: Skin is warm.  ?   Findings: Erythema (B/l legs) present. No rash.  ?   Comments: Brownish mole near left eye  ?Neurological:  ?   General: No focal deficit present.  ?   Mental Status: She is alert and oriented to person, place, and time.  ?   Sensory: No sensory deficit.  ?   Motor: No weakness.  ?Psychiatric:     ?   Mood and Affect: Mood normal.     ?   Behavior: Behavior normal.  ? ? ?BP 122/78 (BP Location: Right Arm, Patient Position: Sitting, Cuff Size: Normal)   Pulse 86   Resp 18   Ht 5' (1.524 m)   Wt 176 lb 12.8 oz (80.2 kg)   SpO2 99%   BMI 34.53 kg/m?  ?Wt Readings from Last 3  Encounters:  ?05/04/21 176 lb 12.8 oz (80.2 kg)  ?02/03/21 176 lb 1.9 oz (79.9 kg)  ?12/30/20 179 lb (81.2 kg)  ? ? ?Lab Results  ?Component Value Date  ? TSH 4.080 05/04/2021  ? ?Lab Results  ?Component Value Date  ? WBC 1

## 2021-05-13 DIAGNOSIS — L918 Other hypertrophic disorders of the skin: Secondary | ICD-10-CM | POA: Diagnosis not present

## 2021-05-13 DIAGNOSIS — L718 Other rosacea: Secondary | ICD-10-CM | POA: Diagnosis not present

## 2021-05-22 ENCOUNTER — Encounter: Payer: Self-pay | Admitting: Podiatry

## 2021-05-22 ENCOUNTER — Ambulatory Visit: Payer: Medicare HMO | Admitting: Podiatry

## 2021-05-22 DIAGNOSIS — B351 Tinea unguium: Secondary | ICD-10-CM | POA: Diagnosis not present

## 2021-05-22 DIAGNOSIS — M79675 Pain in left toe(s): Secondary | ICD-10-CM | POA: Diagnosis not present

## 2021-05-22 DIAGNOSIS — M79674 Pain in right toe(s): Secondary | ICD-10-CM

## 2021-05-31 NOTE — Progress Notes (Signed)
?  Subjective:  ?Patient ID: Nancy Schroeder, female    DOB: August 28, 1933,  MRN: [id number] ? ?Glorianne Proctor presents to clinic today for painful elongated mycotic toenails 1-5 bilaterally which are tender when wearing enclosed shoe gear. Pain is relieved with periodic professional debridement. ? ?New problem(s): None.  ? ?PCP is Lindell Spar, MD , and last visit was February 03, 2021. ? ?No Active Allergies ? ?Review of Systems: Negative except as noted in the HPI. ? ?Objective: No changes noted in today's physical examination. ?General: Patient is a pleasant 88 y.o. Caucasian female WD, WN in NAD. AAO x 3.  ? ?Neurovascular Examination: ?CFT delayed b/l LE. Faintly palpable pedal pulses b/l. Pedal hair absent. No pain with calf compression b/l. No edema noted b/l LE. No cyanosis or clubbing noted b/l LE. ? ?Protective sensation intact 5/5 intact bilaterally with 10g monofilament b/l. Vibratory sensation intact b/l. ? ?Dermatological:  ?Pedal integument with normal turgor, texture and tone b/l LE. No open wounds b/l. No interdigital macerations b/l. Toenails 1-5 b/l elongated, thickened, discolored with subungual debris. +Tenderness with dorsal palpation of nailplates. No hyperkeratotic or porokeratotic lesions present. ? ?Musculoskeletal:  ?Normal muscle strength 5/5 to all lower extremity muscle groups bilaterally. HAV with bunion deformity noted b/l LE. Hammertoe deformity noted 2-5 b/l.Marland Kitchen No pain, crepitus or joint limitation noted with ROM b/l LE.  Patient ambulates independently without assistive aids. ? ? ?  Latest Ref Rng & Units 10/01/2020  ? 10:02 AM  ?Hemoglobin A1C  ?Hemoglobin-A1c 4.8 - 5.6 % 5.7    ? ?Assessment/Plan: ?1. Pain due to onychomycosis of toenails of both feet   ?-Examined patient. ?-Patient to continue soft, supportive shoe gear daily. ?-Mycotic toenails 1-5 bilaterally were debrided in length and girth with sterile nail nippers and dremel without incident. ?-Patient/POA to call should  there be question/concern in the interim.  ? ?Return in about 3 months (around 08/21/2021). ? ?Marzetta Board, DPM  ?

## 2021-06-01 ENCOUNTER — Other Ambulatory Visit: Payer: Self-pay | Admitting: Internal Medicine

## 2021-06-09 ENCOUNTER — Other Ambulatory Visit: Payer: Self-pay | Admitting: Internal Medicine

## 2021-06-09 DIAGNOSIS — M545 Low back pain, unspecified: Secondary | ICD-10-CM

## 2021-07-22 ENCOUNTER — Ambulatory Visit: Payer: Medicare HMO | Admitting: Neurology

## 2021-07-22 ENCOUNTER — Encounter: Payer: Self-pay | Admitting: Neurology

## 2021-07-22 VITALS — BP 180/82 | HR 78 | Ht 60.0 in | Wt 180.0 lb

## 2021-07-22 DIAGNOSIS — G500 Trigeminal neuralgia: Secondary | ICD-10-CM

## 2021-07-22 DIAGNOSIS — M5416 Radiculopathy, lumbar region: Secondary | ICD-10-CM

## 2021-07-22 MED ORDER — CARBAMAZEPINE 200 MG PO TABS: 200.0000 mg | ORAL_TABLET | Freq: Two times a day (BID) | ORAL | 6 refills | Status: DC

## 2021-07-22 NOTE — Progress Notes (Signed)
[facility]  PATIENT: Nancy Schroeder DOB: 04-Dec-1933  REQUESTING CLINICIAN: Lindell Spar, MD HISTORY FROM: Patient and son  REASON FOR VISIT: Headaches    HISTORICAL  CHIEF COMPLAINT:  Chief Complaint  Patient presents with   Follow-up    Rm 14. Accompanied by son. F/u on HA's.   INTERVAL HISTORY 07/22/21:  Patient present today for follow-up, at last visit we started him on her on carbamazepine 300 mg twice daily but she said that medication was so strong therefore she decreased it to 200 mg in the morning and if needed 100 mg in the afternoon.  With that dose it seems to control her pain from the trigeminal neuralgia.  She does also report history of back pain and radiculopathy but states that the carbamazepine helps with the radicular pain.  We also checked her MRI brain and MRA head with no significant abnormality.    HISTORY OF PRESENT ILLNESS:  This is a 88 year old woman with past medical history of anxiety, ?hypothyroidism, hypertension and cataract who is presenting with headache.  Patient said that she had cataract surgery in January 2022, following surgery she started developing lower jaw sharp pain.  Patient said the pain occurs daily, sometime multiple times per day.  It involves the lower jaw sometimes the maxillary area also.  Pain described as sharp shooting pain lasting for few seconds and go away.  On top of this pain, she also have frontal dull headache that can last hours.  She reported both headaches are new since her surgery.  She has been taking Tylenol and ibuprofen with minimal relief.  Has not been on a preventive medical.  She denies any family history of headaches, and denies any previous history of headaches.   Headache History and Characteristics: Onset: January 2022 Location: Frontal headaches and right side lower jaw sharp pain  Quality:  aching and burning  Intensity: 7-8/10.  Duration: last last a few sec (jaw pain) or  hours (dull frontal headaches)  Migrainous Features: No Aura: No  History of brain injury or tumor: No  Family history: No family history of headaches  Motion sickness: no Cardiac history: no  OTC: tylenol, Ibuprofen  Caffeine: 1 to 2 cups of coffee in the morning Sleep: 4 to 5  hrs sleep  Mood/ Stress: Very stress and anxious about her headaches and health in general.   Prior prophylaxis: Propranolol: No  Verapamil:No TCA: No Topamax: No Depakote: No Effexor: No Cymbalta: No Neurontin:No  Prior abortives: Triptan: No Anti-emetic: No Steroids: No Ergotamine suppository: No    OTHER MEDICAL CONDITIONS: HTN, Cataracts, Anxiety    REVIEW OF SYSTEMS: Full 14 system review of systems performed and negative with exception of: as noted in the HPI   ALLERGIES: No Known Allergies  HOME MEDICATIONS: Outpatient Medications Prior to Visit  Medication Sig Dispense Refill   aspirin EC 81 MG tablet Take 81 mg by mouth daily. Swallow whole.     busPIRone (BUSPAR) 10 MG tablet Take 1 tablet (10 mg total) by mouth daily as needed (for mood). 180 tablet 1   hydrochlorothiazide (HYDRODIURIL) 25 MG tablet TAKE 1 TABLET(25 MG) BY MOUTH DAILY 90 tablet 1   ibuprofen (ADVIL) 600 MG tablet TAKE 1 TABLET(600 MG) BY MOUTH DAILY AS NEEDED 30 tablet 0   levothyroxine (SYNTHROID) 25 MCG tablet Take 25 mcg by mouth daily before breakfast.     lisinopril (ZESTRIL) 40 MG tablet TAKE 1 TABLET(40 MG) BY MOUTH DAILY 90 tablet 1  amLODipine (NORVASC) 5 MG tablet Take 1 tablet (5 mg total) by mouth daily. 90 tablet 3   diphenhydrAMINE (BENADRYL) 25 mg capsule Take 25 mg by mouth at bedtime as needed.     Psyllium (METAMUCIL FIBER PO) Take 2 capsules by mouth 3 (three) times daily.     traMADol (ULTRAM) 50 MG tablet Take 1 tablet (50 mg total) by mouth every 12 (twelve) hours as needed. 15 tablet 0   triamcinolone cream (KENALOG) 0.1 % Apply 1 application. topically 2 (two) times daily. 30 g 0   No  facility-administered medications prior to visit.    PAST MEDICAL HISTORY: Past Medical History:  Diagnosis Date   Anxiety    Depression    Essential hypertension    H/O back injury 2016    PAST SURGICAL HISTORY: Past Surgical History:  Procedure Laterality Date   ABDOMINAL HYSTERECTOMY     total   CATARACT EXTRACTION, BILATERAL  01/2020   CHOLECYSTECTOMY     NECK SURGERY      FAMILY HISTORY: Family History  Problem Relation Age of Onset   Diabetes Mother    Diabetes Father    Diabetes Sister     SOCIAL HISTORY: Social History   Socioeconomic History   Marital status: Widowed    Spouse name: Not on file   Number of children: 3   Years of education: Not on file   Highest education level: 8th grade  Occupational History   Occupation: retired  Tobacco Use   Smoking status: Never   Smokeless tobacco: Never  Vaping Use   Vaping Use: Never used  Substance and Sexual Activity   Alcohol use: Yes    Comment: 1-2 drinks per 6 months   Drug use: Never   Sexual activity: Not Currently    Birth control/protection: Surgical, Post-menopausal  Other Topics Concern   Not on file  Social History Narrative   Live with Insurance underwriter (middle)   Has 3 sons   Oldest lives in [place] lives in [place] (with 1 grandchild)   3 cats: lily-16 , troubles, whiskers (boys-5 years)      Enjoyed sewing, and knitting, but back injury made this worse. Read, tv, putt around, likes astrology      Diet: Eats what she wants. Enjoys veggies and fruits   Caffeine: 1-2 cups of coffee, iced tea   Water: 6-8 cups daily       Wear seatbelt   Does not wear sunscreen   Smoke and carbon monoxide detectors         Social Determinants of Health   Financial Resource Strain: Medium Risk (08/29/2020)   Overall Financial Resource Strain (CARDIA)    Difficulty of Paying Living Expenses: Somewhat hard  Food Insecurity: No Food Insecurity (08/29/2020)   Hunger Vital Sign    Worried  About Running Out of Food in the Last Year: Never true    Ran Out of Food in the Last Year: Never true  Transportation Needs: No Transportation Needs (08/29/2020)   PRAPARE - Hydrologist (Medical): No    Lack of Transportation (Non-Medical): No  Physical Activity: Insufficiently Active (08/29/2020)   Exercise Vital Sign    Days of Exercise per Week: 3 days    Minutes of Exercise per Session: 20 min  Stress: No Stress Concern Present (08/29/2020)   [place]    Feeling of Stress : Not at all  Social  Connections: Socially Isolated (08/29/2020)   Social Connection and Isolation Panel [NHANES]    Frequency of Communication with Friends and Family: Once a week    Frequency of Social Gatherings with Friends and Family: Once a week    Attends Religious Services: Never    Marine scientist or Organizations: No    Attends Archivist Meetings: Never    Marital Status: Widowed  Intimate Partner Violence: Not At Risk (08/29/2020)   Humiliation, Afraid, Rape, and Kick questionnaire    Fear of Current or Ex-Partner: No    Emotionally Abused: No    Physically Abused: No    Sexually Abused: No    PHYSICAL EXAM  GENERAL EXAM/CONSTITUTIONAL: Vitals:  Vitals:   07/22/21 1130  BP: (!) 180/82  Pulse: 78  Weight: 180 lb (81.6 kg)  Height: 5' (1.524 m)   Body mass index is 35.15 kg/m. Wt Readings from Last 3 Encounters:  07/22/21 180 lb (81.6 kg)  05/04/21 176 lb 12.8 oz (80.2 kg)  02/03/21 176 lb 1.9 oz (79.9 kg)   Patient is in no distress; well developed, nourished and groomed; neck is supple  EYES: Pupils round and reactive to light, Visual fields full to confrontation, Extraocular movements intacts,   MUSCULOSKELETAL: Gait, strength, tone, movements noted in Neurologic exam below  NEUROLOGIC: MENTAL STATUS:     02/06/2019    1:11 PM  MMSE - Mini Mental State Exam   Orientation to time 5  Orientation to Place 5  Registration 3  Attention/ Calculation 5  Recall 3  Language- name 2 objects 2  Language- repeat 1  Language- follow 3 step command 3  Language- read & follow direction 1  Write a sentence 1  Copy design 1  Total score 30   awake, alert, oriented to person, place and time recent and remote memory intact normal attention and concentration language fluent, comprehension intact, naming intact fund of knowledge appropriate  CRANIAL NERVE:  2nd, 3rd, 4th, 6th - pupils equal and reactive to light, visual fields full to confrontation, extraocular muscles intact, no nystagmus 5th - facial sensation symmetric 7th - facial strength symmetric 8th - hearing intact 9th - palate elevates symmetrically, uvula midline 11th - shoulder shrug symmetric 12th - tongue protrusion midline  MOTOR:  normal bulk and tone, full strength in the BUE, BLE  SENSORY:  normal and symmetric to light touch, pinprick, temperature, vibration  COORDINATION:  finger-nose-finger, fine finger movements normal  REFLEXES:  deep tendon reflexes present and symmetric  GAIT/STATION:  normal   DIAGNOSTIC DATA (LABS, IMAGING, TESTING) - I reviewed patient records, labs, notes, testing and imaging myself where available.  Lab Results  Component Value Date   WBC 10.0 10/01/2020   HGB 13.5 10/01/2020   HCT 39.4 10/01/2020   MCV 92 10/01/2020   PLT 347 10/01/2020      Component Value Date/Time   NA 141 05/04/2021 0921   K 4.2 05/04/2021 0921   CL 106 05/04/2021 0921   CO2 20 05/04/2021 0921   GLUCOSE 116 (H) 05/04/2021 0921   GLUCOSE 190 (H) 04/16/2020 1234   BUN 22 05/04/2021 0921   CREATININE 0.99 05/04/2021 0921   CREATININE 0.82 03/04/2020 1736   CALCIUM 9.3 05/04/2021 0921   PROT 6.3 10/01/2020 1002   ALBUMIN 4.2 10/01/2020 1002   AST 19 10/01/2020 1002   ALT 23 10/01/2020 1002   ALKPHOS 72 10/01/2020 1002   BILITOT 0.5 10/01/2020 1002    GFRNONAA 56 (L)  04/16/2020 1234   GFRNONAA 66 03/04/2020 1736   GFRAA 76 03/04/2020 1736   Lab Results  Component Value Date   CHOL 167 10/01/2020   HDL 45 10/01/2020   LDLCALC 102 (H) 10/01/2020   TRIG 108 10/01/2020   CHOLHDL 3.7 10/01/2020   Lab Results  Component Value Date   HGBA1C 5.7 (H) 10/01/2020   Lab Results  Component Value Date   VITAMINB12 339 08/02/2018   Lab Results  Component Value Date   TSH 4.080 05/04/2021   MRI/MRA Brain 01/08/21 No evidence of recent infarction, hemorrhage, or mass. Mild chronic microvascular ischemic changes. Few chronic infarcts detailed above. No occlusion or significant stenosis   ASSESSMENT AND PLAN  88 y.o. year old female with anxiety, hypertension, history of cataract surgery, previously diagnosed with hypothyroidism who is presenting for follow-up for trigeminal pain.  Patient reports trigeminal neuralgia is controlled with the carbamazepine, only 200 mg in the morning.  At this time I will have the patient continue the same regimen 200 mg in the morning and 100 mg in the afternoon as needed.  She does also have history of back pain in the past and now complaining of radicular pain but mentioned that the Carbamazepine is helping.  I informed patient that if the carbamazepine is not enough to control her radicular pain that we will consider referral to pain management for injection.  I will see her in 6 months for follow-up or sooner if worse.       1. Trigeminal neuralgia   2. Lumbar radiculopathy     Patient Instructions  Continue with Carmabazepine 200 am and 100 pm as needed  Follow-up in 6 months or sooner if worse.   If the radicular pain is getting worse, will consider referral to pain management for injection.    No orders of the defined types were placed in this encounter.   Meds ordered this encounter  Medications   carbamazepine (TEGRETOL) 200 MG tablet    Sig: Take 1 tablet (200 mg total) by mouth in the  morning and at bedtime.    Dispense:  60 tablet    Refill:  6    Return in about 6 months (around 01/21/2022).  I have spent a total of 30 minutes dedicated to this patient today, preparing to see patient, performing a medically appropriate examination and evaluation, ordering tests and/or medications and procedures, and counseling and educating the patient/family/caregiver; independently interpreting result and communicating results to the family/patient/caregiver; and documenting clinical information in the electronic medical record.   Alric Ran, MD 07/22/2021, 4:10 PM  [facility] [address] [place], [place] [address] [phone]

## 2021-07-22 NOTE — Patient Instructions (Addendum)
Continue with Carmabazepine 200 am and 100 pm as needed  Follow-up in 6 months or sooner if worse.   If the radicular pain is getting worse, will consider referral to pain management for injection.

## 2021-08-03 ENCOUNTER — Other Ambulatory Visit: Payer: Self-pay | Admitting: Internal Medicine

## 2021-08-03 DIAGNOSIS — G8929 Other chronic pain: Secondary | ICD-10-CM

## 2021-08-26 ENCOUNTER — Ambulatory Visit: Payer: Medicare HMO | Admitting: Podiatry

## 2021-08-26 ENCOUNTER — Encounter: Payer: Self-pay | Admitting: Podiatry

## 2021-08-26 DIAGNOSIS — I739 Peripheral vascular disease, unspecified: Secondary | ICD-10-CM | POA: Diagnosis not present

## 2021-08-26 DIAGNOSIS — M79674 Pain in right toe(s): Secondary | ICD-10-CM

## 2021-08-26 DIAGNOSIS — B351 Tinea unguium: Secondary | ICD-10-CM | POA: Diagnosis not present

## 2021-08-26 DIAGNOSIS — M79675 Pain in left toe(s): Secondary | ICD-10-CM

## 2021-08-30 NOTE — Progress Notes (Signed)
  Subjective:  Patient ID: Nancy Schroeder, female    DOB: [birth year],  MRN: [id number]  Nancy Schroeder presents to clinic today for painful thick toenails that are difficult to trim. Pain interferes with ambulation. Aggravating factors include wearing enclosed shoe gear. Pain is relieved with periodic professional debridement.  New problem(s): None.   PCP is Lindell Spar, MD , and last visit was  May 04, 2021  No Known Allergies  Review of Systems: Negative except as noted in the HPI.  Objective: No changes noted in today's physical examination.  General: Patient is a pleasant 88 y.o. Caucasian female WD, WN in NAD. AAO x 3.   Neurovascular Examination: CFT delayed b/l LE. Faintly palpable pedal pulses b/l. Pedal hair absent. No pain with calf compression b/l. No edema noted b/l LE. No cyanosis or clubbing noted b/l LE.  Protective sensation intact 5/5 intact bilaterally with 10g monofilament b/l. Vibratory sensation intact b/l.  Dermatological:  Pedal integument with normal turgor, texture and tone b/l LE. No open wounds b/l. No interdigital macerations b/l. Toenails 1-5 b/l elongated, thickened, discolored with subungual debris. +Tenderness with dorsal palpation of nailplates. No hyperkeratotic or porokeratotic lesions present.  Musculoskeletal:  Normal muscle strength 5/5 to all lower extremity muscle groups bilaterally. HAV with bunion deformity noted b/l LE. Hammertoe deformity noted 2-5 b/l.Marland Kitchen No pain, crepitus or joint limitation noted with ROM b/l LE.  Patient ambulates independently without assistive aids.     Latest Ref Rng & Units 10/01/2020   10:02 AM  Hemoglobin A1C  Hemoglobin-A1c 4.8 - 5.6 % 5.7    Assessment/Plan: 1. Pain due to onychomycosis of toenails of both feet   2. Peripheral vascular disease ([place])      -Patient was evaluated and treated. All patient's and/or POA's questions/concerns answered on today's visit. -Patient to continue soft,  supportive shoe gear daily. -Toenails 1-5 b/l were debrided in length and girth with sterile nail nippers and dremel without iatrogenic bleeding.  -Patient/POA to call should there be question/concern in the interim.   Return in about 3 months (around 11/26/2021).  Marzetta Board, DPM

## 2021-09-14 ENCOUNTER — Ambulatory Visit (INDEPENDENT_AMBULATORY_CARE_PROVIDER_SITE_OTHER): Payer: Medicare HMO

## 2021-09-14 DIAGNOSIS — Z0000 Encounter for general adult medical examination without abnormal findings: Secondary | ICD-10-CM

## 2021-09-14 NOTE — Progress Notes (Signed)
I connected with  Nancy Schroeder on 09/14/21 by a audio enabled telemedicine application and verified that I am speaking with the correct person using two identifiers.  Patient Location: Home  Provider Location: Office/Clinic  I discussed the limitations of evaluation and management by telemedicine. The patient expressed understanding and agreed to proceed.  Subjective:   Nancy Schroeder is a 88 y.o. female who presents for Medicare Annual (Subsequent) preventive examination.  Review of Systems    Cardiac Risk Factors include: hypertension;advanced age (>75mn, >>53women)     Objective:    There were no vitals filed for this visit. There is no height or weight on file to calculate BMI.     09/14/2021    8:46 AM 08/29/2020    9:37 AM 04/16/2020   12:34 PM 08/14/2019    4:04 PM  Advanced Directives  Does Patient Have a Medical Advance Directive? No No No No  Would patient like information on creating a medical advance directive? Yes (ED - Information included in AVS) No - Patient declined      Current Medications (verified) Outpatient Encounter Medications as of 09/14/2021  Medication Sig   amLODipine (NORVASC) 5 MG tablet Take 1 tablet (5 mg total) by mouth daily.   aspirin EC 81 MG tablet Take 81 mg by mouth daily. Swallow whole.   busPIRone (BUSPAR) 10 MG tablet Take 1 tablet (10 mg total) by mouth daily as needed (for mood).   carbamazepine (TEGRETOL) 200 MG tablet Take 1 tablet (200 mg total) by mouth in the morning and at bedtime.   diphenhydrAMINE (BENADRYL) 25 mg capsule Take 25 mg by mouth at bedtime as needed.   hydrochlorothiazide (HYDRODIURIL) 25 MG tablet TAKE 1 TABLET(25 MG) BY MOUTH DAILY   ibuprofen (ADVIL) 600 MG tablet TAKE 1 TABLET(600 MG) BY MOUTH DAILY AS NEEDED   levothyroxine (SYNTHROID) 25 MCG tablet Take 25 mcg by mouth daily before breakfast.   lisinopril (ZESTRIL) 40 MG tablet TAKE 1 TABLET(40 MG) BY MOUTH DAILY   Psyllium (METAMUCIL FIBER PO)  Take 2 capsules by mouth 3 (three) times daily.   traMADol (ULTRAM) 50 MG tablet Take 1 tablet (50 mg total) by mouth every 12 (twelve) hours as needed.   triamcinolone cream (KENALOG) 0.1 % Apply 1 application. topically 2 (two) times daily.   No facility-administered encounter medications on file as of 09/14/2021.    Allergies (verified) Patient has no known allergies.   History: Past Medical History:  Diagnosis Date   Anxiety    Depression    Essential hypertension    H/O back injury 2016   Past Surgical History:  Procedure Laterality Date   ABDOMINAL HYSTERECTOMY     total   CATARACT EXTRACTION, BILATERAL  01/2020   CHOLECYSTECTOMY     NECK SURGERY     Family History  Problem Relation Age of Onset   Diabetes Mother    Diabetes Father    Diabetes Sister    Social History   Socioeconomic History   Marital status: Widowed    Spouse name: Not on file   Number of children: 3   Years of education: Not on file   Highest education level: 8th grade  Occupational History   Occupation: retired  Tobacco Use   Smoking status: Never   Smokeless tobacco: Never  Vaping Use   Vaping Use: Never used  Substance and Sexual Activity   Alcohol use: Yes    Comment: 1-2 drinks per 6 months   Drug use:  Never   Sexual activity: Not Currently    Birth control/protection: Surgical, Post-menopausal  Other Topics Concern   Not on file  Social History Narrative   Live with Nancy Schroeder (middle)   Has 3 sons   Oldest lives in [place] lives in [place] (with 1 grandchild)   3 cats: Nancy Schroeder-16 , Nancy Schroeder, Nancy Schroeder (boys-5 years)      Enjoyed sewing, and knitting, but back injury made this worse. Read, tv, putt around, likes astrology      Diet: Eats what she wants. Enjoys veggies and fruits   Caffeine: 1-2 cups of coffee, iced tea   Water: 6-8 cups daily       Wear seatbelt   Does not wear sunscreen   Smoke and carbon monoxide detectors         Social Determinants of  Health   Financial Resource Strain: Medium Risk (08/29/2020)   Overall Financial Resource Strain (CARDIA)    Difficulty of Paying Living Expenses: Somewhat hard  Food Insecurity: No Food Insecurity (09/14/2021)   Hunger Vital Sign    Worried About Running Out of Food in the Last Year: Never true    Ran Out of Food in the Last Year: Never true  Transportation Needs: No Transportation Needs (09/14/2021)   PRAPARE - Hydrologist (Medical): No    Lack of Transportation (Non-Medical): No  Physical Activity: Inactive (09/14/2021)   Exercise Vital Sign    Days of Exercise per Week: 0 days    Minutes of Exercise per Session: 0 min  Stress: No Stress Concern Present (08/29/2020)   [place]    Feeling of Stress : Not at all  Social Connections: Socially Isolated (08/29/2020)   Social Connection and Isolation Panel [NHANES]    Frequency of Communication with Friends and Family: Once a week    Frequency of Social Gatherings with Friends and Family: Once a week    Attends Religious Services: Never    Marine scientist or Organizations: No    Attends Archivist Meetings: Never    Marital Status: Widowed    Tobacco Counseling Counseling given: Not Answered   Clinical Intake:  Pre-visit preparation completed: Yes  Pain : No/denies pain     Nutritional Status: BMI <19  Underweight     Diabetic?no         Activities of Daily Living    09/14/2021    8:38 AM  In your present state of health, do you have any difficulty performing the following activities:  Hearing? 1  Vision? 0  Difficulty concentrating or making decisions? 0  Walking or climbing stairs? 1  Dressing or bathing? 0  Doing errands, shopping? 0  Preparing Food and eating ? N  Using the Toilet? N  In the past six months, have you accidently leaked urine? N  Do you have problems with loss of bowel control? N   Managing your Medications? N  Managing your Finances? N  Housekeeping or managing your Housekeeping? N    Patient Care Team: Lindell Spar, MD as PCP - General (Internal Medicine) Satira Sark, MD as PCP - Cardiology (Cardiology)  Indicate any recent Medical Services you may have received from other than [facility] providers in the past year (date may be approximate).     Assessment:   This is a routine wellness examination for [place].  Hearing/Vision screen No results found.  Dietary issues  and exercise activities discussed: Current Exercise Habits: The patient does not participate in regular exercise at present   Goals Addressed             This Visit's Progress    Patient Stated       [facility]     Patient Stated       Get pneumonia vaccine at your next appt with Dr Posey Pronto       Depression Screen    05/04/2021    8:52 AM 02/03/2021    8:06 AM 12/30/2020    3:06 PM 10/01/2020    9:09 AM 08/29/2020    9:41 AM 08/29/2020    9:34 AM 05/14/2020    8:29 AM  PHQ 2/9 Scores  PHQ - 2 Score 1 0 0 0 0 1 1  PHQ- 9 Score 1 0  9       Fall Risk    05/04/2021    8:51 AM 02/03/2021    8:05 AM 12/30/2020    3:06 PM 10/01/2020    9:09 AM 08/29/2020    9:37 AM  Fall Risk   Falls in the past year? 0 0 0 0 0  Number falls in past yr: 0 0 0 0 0  Injury with Fall? 0 0 0 0 0  Risk for fall due to : No Fall Risks No Fall Risks No Fall Risks No Fall Risks Other (Comment);No Fall Risks  Follow up Falls evaluation completed Falls evaluation completed Falls evaluation completed Falls evaluation completed Falls evaluation completed    [place]:  Any stairs in or around the home? No  If so, are there any without handrails? No  Home free of loose throw rugs in walkways, pet beds, electrical cords, etc? Yes  Adequate lighting in your home to reduce risk of falls? Yes   ASSISTIVE DEVICES UTILIZED TO PREVENT FALLS:  Life  alert? No  Use of a cane, walker or w/c? Yes  Grab bars in the bathroom? No  Shower chair or bench in shower? Yes  Elevated toilet seat or a handicapped toilet? No   Cognitive Function:    02/06/2019    1:11 PM  MMSE - Mini Mental State Exam  Orientation to time 5  Orientation to Place 5  Registration 3  Attention/ Calculation 5  Recall 3  Language- name 2 objects 2  Language- repeat 1  Language- follow 3 step command 3  Language- read & follow direction 1  Write a sentence 1  Copy design 1  Total score 30        08/29/2020    9:42 AM  6CIT Screen  What Year? 0 points  What month? 0 points  What time? 0 points  Count back from 20 0 points  Months in reverse 0 points  Repeat phrase 2 points  Total Score 2 points    Immunizations Immunization History  Administered Date(s) Administered   Fluad Quad(high Dose 65+) 11/14/2019, 10/01/2020   Moderna Sars-Covid-2 Vaccination 03/18/2019, 04/15/2019, 12/05/2019    TDAP status: Due, Education has been provided regarding the importance of this vaccine. Advised may receive this vaccine at local pharmacy or Health Dept. Aware to provide a copy of the vaccination record if obtained from local pharmacy or Health Dept. Verbalized acceptance and understanding.  Flu Vaccine status: Up to date  Pneumococcal vaccine status: Due, Education has been provided regarding the importance of this vaccine. Advised may receive this  vaccine at local pharmacy or Health Dept. Aware to provide a copy of the vaccination record if obtained from local pharmacy or Health Dept. Verbalized acceptance and understanding.  Covid-19 vaccine status: Completed vaccines  Qualifies for Shingles Vaccine? Yes   Zostavax completed No   Shingrix Completed?: No.    Education has been provided regarding the importance of this vaccine. Patient has been advised to call insurance company to determine out of pocket expense if they have not yet received this vaccine.  Advised may also receive vaccine at local pharmacy or Health Dept. Verbalized acceptance and understanding.  Screening Tests Health Maintenance  Topic Date Due   TETANUS/TDAP  Never done   Zoster Vaccines- Shingrix (1 of 2) Never done   Pneumonia Vaccine 10+ Years old (1 - PCV) Never done   DEXA SCAN  Never done   COVID-19 Vaccine (4 - Moderna series) 01/30/2020   INFLUENZA VACCINE  09/01/2021   HPV VACCINES  Aged Out    Health Maintenance  Health Maintenance Due  Topic Date Due   TETANUS/TDAP  Never done   Zoster Vaccines- Shingrix (1 of 2) Never done   Pneumonia Vaccine 32+ Years old (1 - PCV) Never done   DEXA SCAN  Never done   COVID-19 Vaccine (4 - Moderna series) 01/30/2020   INFLUENZA VACCINE  09/01/2021    Colorectal cancer screening: No longer required.   Mammogram status: No longer required due to age.  Bone density no longer required   Lung Cancer Screening: (Low Dose CT Chest recommended if Age 46-80 years, 30 pack-year currently smoking OR have quit w/in 15years.) does not qualify.   Lung Cancer Screening Referral: na  Additional Screening:  Hepatitis C Screening: does not qualify; Completed   Vision Screening: Recommended annual ophthalmology exams for early detection of glaucoma and other disorders of the eye. Is the patient up to date with their annual eye exam?  Yes  Who is the provider or what is the name of the office in which the patient attends annual eye exams?  If pt is not established with a provider, would they like to be referred to a provider to establish care? No .   Dental Screening: Recommended annual dental exams for proper oral hygiene  Community Resource Referral / Chronic Care Management: CRR required this visit?  No   CCM required this visit?  No      Plan:     I have personally reviewed and noted the following in the patient's chart:   Medical and social history Use of alcohol, tobacco or illicit drugs  Current  medications and supplements including opioid prescriptions.  Functional ability and status Nutritional status Physical activity Advanced directives List of other physicians Hospitalizations, surgeries, and ER visits in previous 12 months Vitals Screenings to include cognitive, depression, and falls Referrals and appointments  In addition, I have reviewed and discussed with patient certain preventive protocols, quality metrics, and best practice recommendations. A written personalized care plan for preventive services as well as general preventive health recommendations were provided to patient.     Eual Fines, LPN   1/82/9937   Nurse Notes: Schedule your next wellness visit for 1 year at checkout

## 2021-09-14 NOTE — Patient Instructions (Addendum)
  Ms. Vesely , Thank you for taking time to come for your Medicare Wellness Visit. I appreciate your ongoing commitment to your health goals. Please review the following plan we discussed and let me know if I can assist you in the future.   These are the goals we discussed:  Goals      Patient Stated     [facility]     Patient Stated     Get pneumonia vaccine at your next appt with Dr Posey Pronto        This is a list of the screening recommended for you and due dates:  Health Maintenance  Topic Date Due   Tetanus Vaccine  Never done   Zoster (Shingles) Vaccine (1 of 2) Never done   Pneumonia Vaccine (1 - PCV) Never done   DEXA scan (bone density measurement)  Never done   COVID-19 Vaccine (4 - Moderna series) 01/30/2020   Flu Shot  09/01/2021   HPV Vaccine  Aged Out

## 2021-10-08 ENCOUNTER — Encounter: Payer: Medicare HMO | Admitting: Internal Medicine

## 2021-10-09 ENCOUNTER — Encounter: Payer: Medicare HMO | Admitting: Internal Medicine

## 2021-10-22 ENCOUNTER — Encounter: Payer: Self-pay | Admitting: Internal Medicine

## 2021-10-22 ENCOUNTER — Ambulatory Visit (INDEPENDENT_AMBULATORY_CARE_PROVIDER_SITE_OTHER): Payer: Medicare HMO | Admitting: Internal Medicine

## 2021-10-22 VITALS — BP 138/72 | HR 92 | Resp 18 | Ht 60.0 in | Wt 183.6 lb

## 2021-10-22 DIAGNOSIS — I10 Essential (primary) hypertension: Secondary | ICD-10-CM | POA: Diagnosis not present

## 2021-10-22 DIAGNOSIS — E538 Deficiency of other specified B group vitamins: Secondary | ICD-10-CM

## 2021-10-22 DIAGNOSIS — M5416 Radiculopathy, lumbar region: Secondary | ICD-10-CM

## 2021-10-22 DIAGNOSIS — Z0001 Encounter for general adult medical examination with abnormal findings: Secondary | ICD-10-CM

## 2021-10-22 DIAGNOSIS — E039 Hypothyroidism, unspecified: Secondary | ICD-10-CM | POA: Diagnosis not present

## 2021-10-22 DIAGNOSIS — Z23 Encounter for immunization: Secondary | ICD-10-CM | POA: Diagnosis not present

## 2021-10-22 DIAGNOSIS — E559 Vitamin D deficiency, unspecified: Secondary | ICD-10-CM

## 2021-10-22 DIAGNOSIS — R5383 Other fatigue: Secondary | ICD-10-CM | POA: Diagnosis not present

## 2021-10-22 MED ORDER — HYDROCHLOROTHIAZIDE 25 MG PO TABS: 12.5000 mg | ORAL_TABLET | Freq: Every day | ORAL | 1 refills | Status: DC

## 2021-10-22 MED ORDER — IBUPROFEN 800 MG PO TABS: 800.0000 mg | ORAL_TABLET | Freq: Three times a day (TID) | ORAL | 1 refills | Status: DC | PRN

## 2021-10-22 MED ORDER — AMLODIPINE BESYLATE 5 MG PO TABS: 5.0000 mg | ORAL_TABLET | Freq: Every day | ORAL | 3 refills | Status: DC

## 2021-10-22 NOTE — Assessment & Plan Note (Signed)
Has chronic fatigue Check TSH and free T4 Had stopped NP thyroid, was started on Levothyroxine 25 mcg QD

## 2021-10-22 NOTE — Assessment & Plan Note (Signed)
Chronic low back pain with radiation to the LE Uncontrolled with Tramadol Prefers Ibuprofen as it worked better Advised to avoid daily oral NSAIDs Advised to wear back brace and/or apply heating pad as needed

## 2021-10-22 NOTE — Progress Notes (Signed)
Established Patient Office Visit  Subjective:  Patient ID: Emma-Lee Oddo, female    DOB: 08-Jul-1933  Age: 88 y.o. MRN: [id number]  CC:  Chief Complaint  Patient presents with   Annual Exam    Annual exam pt feels fatigued all the time would like to know if you want to increase synthroid doesn't take headache medicine due to side effects and tramadol she takes two and it doesn't do anything    HPI Esbeydi Manago is a 88 y.o. female with past medical history of HTN, hypothyroidism, trigeminal neuralgia and obesity who presents for annual physical.  HTN: BP is well-controlled. Takes medications regularly. Patient denies headache, dizziness, chest pain, dyspnea or palpitations.   Chronic fatigue and subclinical hypothyroidism: Her TSH was borderline elevated in 12/2020 and was started on Levothyroxine.  She has started taking her levothyroxine since then.  Her fatigue had slightly improved, but she c/o fatigue now.  Her weight has been stable as well.  Trigeminal neuralgia: She was started on Tegretol, and had visit with neurologist, but she has stopped taking Tegretol now as she is concerned about its effects - facial and and arm swelling written on paper. She herself had tolerated it well.  She continues to have facial area pain on her right side.  She complains of chronic low back pain, which is worse with movement.  She has tried taking ibuprofen with some relief.  She used to take Norco as needed for severe back pain. She tried Tramadol, but did not help much.     Past Medical History:  Diagnosis Date   Anxiety    Depression    Essential hypertension    H/O back injury 2016    Past Surgical History:  Procedure Laterality Date   ABDOMINAL HYSTERECTOMY     total   CATARACT EXTRACTION, BILATERAL  01/2020   CHOLECYSTECTOMY     NECK SURGERY      Family History  Problem Relation Age of Onset   Diabetes Mother    Diabetes Father    Diabetes Sister     Social  History   Socioeconomic History   Marital status: Widowed    Spouse name: Not on file   Number of children: 3   Years of education: Not on file   Highest education level: 8th grade  Occupational History   Occupation: retired  Tobacco Use   Smoking status: Never   Smokeless tobacco: Never  Vaping Use   Vaping Use: Never used  Substance and Sexual Activity   Alcohol use: Yes    Comment: 1-2 drinks per 6 months   Drug use: Never   Sexual activity: Not Currently    Birth control/protection: Surgical, Post-menopausal  Other Topics Concern   Not on file  Social History Narrative   Live with Insurance underwriter (middle)   Has 3 sons   Oldest lives in [place] lives in [place] (with 1 grandchild)   3 cats: lily-16 , troubles, whiskers (boys-5 years)      Enjoyed sewing, and knitting, but back injury made this worse. Read, tv, putt around, likes astrology      Diet: Eats what she wants. Enjoys veggies and fruits   Caffeine: 1-2 cups of coffee, iced tea   Water: 6-8 cups daily       Wear seatbelt   Does not wear sunscreen   Smoke and carbon monoxide detectors         Social Determinants of Health   Financial  Resource Strain: Medium Risk (08/29/2020)   Overall Financial Resource Strain (CARDIA)    Difficulty of Paying Living Expenses: Somewhat hard  Food Insecurity: No Food Insecurity (09/14/2021)   Hunger Vital Sign    Worried About Running Out of Food in the Last Year: Never true    Ran Out of Food in the Last Year: Never true  Transportation Needs: No Transportation Needs (09/14/2021)   PRAPARE - Hydrologist (Medical): No    Lack of Transportation (Non-Medical): No  Physical Activity: Inactive (09/14/2021)   Exercise Vital Sign    Days of Exercise per Week: 0 days    Minutes of Exercise per Session: 0 min  Stress: No Stress Concern Present (08/29/2020)   [place]     Feeling of Stress : Not at all  Social Connections: Socially Isolated (08/29/2020)   Social Connection and Isolation Panel [NHANES]    Frequency of Communication with Friends and Family: Once a week    Frequency of Social Gatherings with Friends and Family: Once a week    Attends Religious Services: Never    Marine scientist or Organizations: No    Attends Archivist Meetings: Never    Marital Status: Widowed  Intimate Partner Violence: Not At Risk (08/29/2020)   Humiliation, Afraid, Rape, and Kick questionnaire    Fear of Current or Ex-Partner: No    Emotionally Abused: No    Physically Abused: No    Sexually Abused: No    Outpatient Medications Prior to Visit  Medication Sig Dispense Refill   aspirin EC 81 MG tablet Take 81 mg by mouth daily. Swallow whole.     busPIRone (BUSPAR) 10 MG tablet Take 1 tablet (10 mg total) by mouth daily as needed (for mood). 180 tablet 1   diphenhydrAMINE (BENADRYL) 25 mg capsule Take 25 mg by mouth at bedtime as needed.     levothyroxine (SYNTHROID) 25 MCG tablet Take 25 mcg by mouth daily before breakfast.     lisinopril (ZESTRIL) 40 MG tablet TAKE 1 TABLET(40 MG) BY MOUTH DAILY 90 tablet 1   Psyllium (METAMUCIL FIBER PO) Take 2 capsules by mouth 3 (three) times daily.     triamcinolone cream (KENALOG) 0.1 % Apply 1 application. topically 2 (two) times daily. 30 g 0   amLODipine (NORVASC) 5 MG tablet Take 1 tablet (5 mg total) by mouth daily. 90 tablet 3   hydrochlorothiazide (HYDRODIURIL) 25 MG tablet TAKE 1 TABLET(25 MG) BY MOUTH DAILY 90 tablet 1   ibuprofen (ADVIL) 600 MG tablet TAKE 1 TABLET(600 MG) BY MOUTH DAILY AS NEEDED 30 tablet 0   traMADol (ULTRAM) 50 MG tablet Take 1 tablet (50 mg total) by mouth every 12 (twelve) hours as needed. 15 tablet 0   carbamazepine (TEGRETOL) 200 MG tablet Take 1 tablet (200 mg total) by mouth in the morning and at bedtime. (Patient not taking: Reported on 10/22/2021) 60 tablet 6   No  facility-administered medications prior to visit.    No Known Allergies  ROS Review of Systems  Constitutional:  Positive for fatigue. Negative for chills and fever.  HENT:  Negative for sinus pressure, sinus pain and sore throat.   Eyes:  Negative for pain and discharge.  Respiratory:  Negative for cough and shortness of breath.   Cardiovascular:  Negative for chest pain and palpitations.  Gastrointestinal:  Negative for abdominal pain, constipation, diarrhea, nausea and vomiting.  Endocrine:  Negative for polydipsia and polyuria.  Genitourinary:  Negative for dysuria and hematuria.  Musculoskeletal:  Positive for back pain. Negative for neck pain and neck stiffness.  Skin:  Negative for rash.  Neurological:  Negative for dizziness and weakness.       Twitching of left foot toes  Psychiatric/Behavioral:  Negative for agitation and behavioral problems.       Objective:    Physical Exam Vitals reviewed.  Constitutional:      General: She is not in acute distress.    Appearance: She is not diaphoretic.  HENT:     Head: Normocephalic and atraumatic.     Nose: No congestion.     Mouth/Throat:     Mouth: Mucous membranes are moist.     Pharynx: No posterior oropharyngeal erythema.  Eyes:     General: No scleral icterus.    Extraocular Movements: Extraocular movements intact.  Cardiovascular:     Rate and Rhythm: Normal rate and regular rhythm.     Pulses: Normal pulses.     Heart sounds: Normal heart sounds. No murmur heard. Pulmonary:     Breath sounds: Normal breath sounds. No wheezing or rales.  Musculoskeletal:        General: Tenderness (Right paraspinal (lumbar) area) present.     Cervical back: Neck supple. No tenderness.     Right lower leg: No edema.     Left lower leg: No edema.  Skin:    General: Skin is warm.     Findings: No rash.  Neurological:     General: No focal deficit present.     Mental Status: She is alert and oriented to person, place, and time.      Sensory: No sensory deficit.     Motor: No weakness.  Psychiatric:        Mood and Affect: Mood normal.        Behavior: Behavior normal.     BP 138/72 (BP Location: Right Arm, Patient Position: Sitting, Cuff Size: Normal)   Pulse 92   Resp 18   Ht 5' (1.524 m)   Wt 183 lb 9.6 oz (83.3 kg)   SpO2 99%   BMI 35.86 kg/m  Wt Readings from Last 3 Encounters:  10/22/21 183 lb 9.6 oz (83.3 kg)  07/22/21 180 lb (81.6 kg)  05/04/21 176 lb 12.8 oz (80.2 kg)    Lab Results  Component Value Date   TSH 4.080 05/04/2021   Lab Results  Component Value Date   WBC 10.0 10/01/2020   HGB 13.5 10/01/2020   HCT 39.4 10/01/2020   MCV 92 10/01/2020   PLT 347 10/01/2020   Lab Results  Component Value Date   NA 141 05/04/2021   K 4.2 05/04/2021   CO2 20 05/04/2021   GLUCOSE 116 (H) 05/04/2021   BUN 22 05/04/2021   CREATININE 0.99 05/04/2021   BILITOT 0.5 10/01/2020   ALKPHOS 72 10/01/2020   AST 19 10/01/2020   ALT 23 10/01/2020   PROT 6.3 10/01/2020   ALBUMIN 4.2 10/01/2020   CALCIUM 9.3 05/04/2021   ANIONGAP 10 04/16/2020   EGFR 56 (L) 05/04/2021   Lab Results  Component Value Date   CHOL 167 10/01/2020   Lab Results  Component Value Date   HDL 45 10/01/2020   Lab Results  Component Value Date   LDLCALC 102 (H) 10/01/2020   Lab Results  Component Value Date   TRIG 108 10/01/2020   Lab Results  Component Value Date  CHOLHDL 3.7 10/01/2020   Lab Results  Component Value Date   HGBA1C 5.7 (H) 10/01/2020      Assessment & Plan:   Problem List Items Addressed This Visit       Cardiovascular and Mediastinum   Essential hypertension    BP Readings from Last 1 Encounters:  10/22/21 138/72  Well-controlled with Lisinopril and HCTZ She has apparently ran out of amlodipine Considering her BP is WNL today, will try to decrease dose of HCTZ to avoid dehydration related fatigue and sent refills of amlodipine Counseled for compliance with the  medications Advised DASH diet and moderate exercise/walking as tolerated      Relevant Medications   hydrochlorothiazide (HYDRODIURIL) 25 MG tablet   amLODipine (NORVASC) 5 MG tablet   Other Relevant Orders   CMP14+EGFR   CBC with Differential/Platelet     Endocrine   Hypothyroidism    Has chronic fatigue Check TSH and free T4 Had stopped NP thyroid, was started on Levothyroxine 25 mcg QD      Relevant Orders   CMP14+EGFR   CBC with Differential/Platelet   TSH + free T4     Nervous and Auditory   Lumbar radiculopathy    Chronic low back pain with radiation to the LE Uncontrolled with Tramadol Prefers Ibuprofen as it worked better Advised to avoid daily oral NSAIDs Advised to wear back brace and/or apply heating pad as needed      Relevant Medications   ibuprofen (ADVIL) 800 MG tablet     Other   Fatigue    Chronic fatigue, initially had improved with levothyroxine for hypothyroidism She complains of fatigue now Check CMP, TSH, free T4, vitamin D and B12      Relevant Orders   Hemoglobin A1c   CMP14+EGFR   Encounter for general adult medical examination with abnormal findings - Primary    Physical exam as documented. Counseling done  re healthy lifestyle involving commitment to 150 minutes exercise per week, heart healthy diet, and attaining healthy weight.The importance of adequate sleep also discussed. Changes in health habits are decided on by the patient with goals and time frames  set for achieving them. Immunization and cancer screening needs are specifically addressed at this visit.      Relevant Orders   CMP14+EGFR   CBC with Differential/Platelet   Other Visit Diagnoses     Vitamin D deficiency       Relevant Orders   VITAMIN D 25 Hydroxy (Vit-D Deficiency, Fractures)   Vitamin B12 deficiency       Relevant Orders   B12   Need for pneumococcal 20-valent conjugate vaccination       Relevant Orders   Pneumococcal conjugate vaccine 20-valent  (Prevnar 20) (Completed)   Need for immunization against influenza       Relevant Orders   Flu Vaccine QUAD High Dose(Fluad) (Completed)       Meds ordered this encounter  Medications   hydrochlorothiazide (HYDRODIURIL) 25 MG tablet    Sig: Take 0.5 tablets (12.5 mg total) by mouth daily.    Dispense:  45 tablet    Refill:  1   amLODipine (NORVASC) 5 MG tablet    Sig: Take 1 tablet (5 mg total) by mouth daily.    Dispense:  90 tablet    Refill:  3   ibuprofen (ADVIL) 800 MG tablet    Sig: Take 1 tablet (800 mg total) by mouth every 8 (eight) hours as needed.    Dispense:  30 tablet    Refill:  1    Follow-up: Return in about 4 months (around 02/21/2022) for HTN.    Lindell Spar, MD

## 2021-10-22 NOTE — Assessment & Plan Note (Signed)
Chronic fatigue, initially had improved with levothyroxine for hypothyroidism She complains of fatigue now Check CMP, TSH, free T4, vitamin D and B12

## 2021-10-22 NOTE — Assessment & Plan Note (Signed)
BP Readings from Last 1 Encounters:  10/22/21 138/72   Well-controlled with Lisinopril and HCTZ She has apparently ran out of amlodipine Considering her BP is WNL today, will try to decrease dose of HCTZ to avoid dehydration related fatigue and sent refills of amlodipine Counseled for compliance with the medications Advised DASH diet and moderate exercise/walking as tolerated

## 2021-10-22 NOTE — Assessment & Plan Note (Signed)

## 2021-10-22 NOTE — Patient Instructions (Signed)
Please take Amlodipine 5 mg and HCTZ only 1/2 tablet once daily.  Please continue to take other medications as prescribed.  Please consider getting Shingrix vaccine and Tdap vaccines at local pharmacy.

## 2021-10-23 ENCOUNTER — Telehealth: Payer: Self-pay | Admitting: Internal Medicine

## 2021-10-23 ENCOUNTER — Encounter: Payer: Medicare HMO | Admitting: Internal Medicine

## 2021-10-23 NOTE — Telephone Encounter (Signed)
PATEL PATIENT    Pt called stating she received several shots yesterday. Now she has fever, chills and headache. Wants to know if the shot caused her to be sick?

## 2021-10-23 NOTE — Telephone Encounter (Signed)
Received flu and pcv 20. Pls advise

## 2021-10-24 LAB — CMP14+EGFR
ALT: 43 IU/L — ABNORMAL HIGH (ref 0–32)
AST: 50 IU/L — ABNORMAL HIGH (ref 0–40)
Albumin/Globulin Ratio: 1.7 (ref 1.2–2.2)
Albumin: 4.1 g/dL (ref 3.7–4.7)
Alkaline Phosphatase: 79 IU/L (ref 44–121)
BUN/Creatinine Ratio: 13 (ref 12–28)
BUN: 9 mg/dL (ref 8–27)
Bilirubin Total: 0.3 mg/dL (ref 0.0–1.2)
CO2: 20 mmol/L (ref 20–29)
Calcium: 9.6 mg/dL (ref 8.7–10.3)
Chloride: 104 mmol/L (ref 96–106)
Creatinine, Ser: 0.72 mg/dL (ref 0.57–1.00)
Globulin, Total: 2.4 g/dL (ref 1.5–4.5)
Glucose: 126 mg/dL — ABNORMAL HIGH (ref 70–99)
Potassium: 4.3 mmol/L (ref 3.5–5.2)
Sodium: 143 mmol/L (ref 134–144)
Total Protein: 6.5 g/dL (ref 6.0–8.5)
eGFR: 82 mL/min/{1.73_m2} (ref >59)

## 2021-10-24 LAB — CBC WITH DIFFERENTIAL/PLATELET
Basophils Absolute: 0.1 10*3/uL (ref 0.0–0.2)
Basos: 1 %
EOS (ABSOLUTE): 0.2 10*3/uL (ref 0.0–0.4)
Eos: 2 %
Hematocrit: 40.4 % (ref 34.0–46.6)
Hemoglobin: 13.7 g/dL (ref 11.1–15.9)
Immature Grans (Abs): 0 10*3/uL (ref 0.0–0.1)
Immature Granulocytes: 0 %
Lymphocytes Absolute: 2 10*3/uL (ref 0.7–3.1)
Lymphs: 18 %
MCH: 31.5 pg (ref 26.6–33.0)
MCHC: 33.9 g/dL (ref 31.5–35.7)
MCV: 93 fL (ref 79–97)
Monocytes Absolute: 1.1 10*3/uL — ABNORMAL HIGH (ref 0.1–0.9)
Monocytes: 10 %
Neutrophils Absolute: 7.7 10*3/uL — ABNORMAL HIGH (ref 1.4–7.0)
Neutrophils: 69 %
Platelets: 319 10*3/uL (ref 150–450)
RBC: 4.35 x10E6/uL (ref 3.77–5.28)
RDW: 11.8 % (ref 11.7–15.4)
WBC: 11.2 10*3/uL — ABNORMAL HIGH (ref 3.4–10.8)

## 2021-10-24 LAB — VITAMIN B12: Vitamin B-12: 349 pg/mL (ref 232–1245)

## 2021-10-24 LAB — TSH+FREE T4
Free T4: 1.26 ng/dL (ref 0.82–1.77)
TSH: 3.11 u[IU]/mL (ref 0.450–4.500)

## 2021-10-24 LAB — HEMOGLOBIN A1C
Est. average glucose Bld gHb Est-mCnc: 128 mg/dL
Hgb A1c MFr Bld: 6.1 % — ABNORMAL HIGH (ref 4.8–5.6)

## 2021-10-24 LAB — VITAMIN D 25 HYDROXY (VIT D DEFICIENCY, FRACTURES): Vit D, 25-Hydroxy: 16 ng/mL — ABNORMAL LOW (ref 30.0–100.0)

## 2021-10-26 NOTE — Telephone Encounter (Signed)
Pt is feeling much better today

## 2021-11-12 ENCOUNTER — Encounter: Payer: Self-pay | Admitting: Emergency Medicine

## 2021-11-12 ENCOUNTER — Ambulatory Visit
Admission: EM | Admit: 2021-11-12 | Discharge: 2021-11-12 | Disposition: A | Discharge status: Home / Self Care | Payer: Medicare HMO

## 2021-11-12 ENCOUNTER — Other Ambulatory Visit: Payer: Self-pay

## 2021-11-12 DIAGNOSIS — G500 Trigeminal neuralgia: Secondary | ICD-10-CM

## 2021-11-12 NOTE — ED Provider Notes (Signed)
[facility]    CSN: [id number] Arrival date & time: 11/12/21  1333      History   Chief Complaint Chief Complaint  Patient presents with   Neck Pain    HPI Nancy Schroeder is a 88 y.o. female.   Patient presents for right-sided neck pain and stiffness that has been ongoing for the past 4 days.  She reports the pain is moderate to severe and sharp.  She denies sharp pain shooting down her arm, but reports the pain is traveling down the arm a little bit.  Reports she stopped taking Tegretol about 4 weeks ago because of fear of side effects.  She has not followed up with her neurologist since stopping the medication.  She denies any new injury, accident, or trauma to her neck.  She denies weakness, numbness or tingling/decreased sensation in upper extremities, vision changes, or headache.  No fevers, cough, or recent sore throat.  Review of chart shows she has a history of trigeminal neuralgia and is prescribed Tegretol for this.  In their last note, it is reported that the Tegretol is helping with the radicular pain.    Past Medical History:  Diagnosis Date   Anxiety    Depression    Essential hypertension    H/O back injury 2016    Patient Active Problem List   Diagnosis Date Noted   Lumbar radiculopathy 05/07/2021   Contact dermatitis 05/07/2021   Atypical mole 05/07/2021   Encounter for general adult medical examination with abnormal findings 10/01/2020   Trigeminal neuralgia pain 10/01/2020   Pulmonary nodule 04/29/2020   Fatigue 04/29/2020   Exertional dyspnea 04/21/2020   Hypothyroidism 04/21/2020   Forgetfulness 02/06/2019   Acute midline thoracic back pain 02/06/2019   Obesity (BMI 30.0-34.9) 02/06/2019   Pain due to onychomycosis of toenails of both feet 09/01/2018   Essential hypertension 02/20/2007    Past Surgical History:  Procedure Laterality Date   ABDOMINAL HYSTERECTOMY     total   CATARACT EXTRACTION, BILATERAL  01/2020    CHOLECYSTECTOMY     NECK SURGERY      OB History   No obstetric history on file.      Home Medications    Prior to Admission medications   Medication Sig Start Date End Date Taking? Authorizing Provider  amLODipine (NORVASC) 5 MG tablet Take 1 tablet (5 mg total) by mouth daily. 10/22/21   Lindell Spar, MD  aspirin EC 81 MG tablet Take 81 mg by mouth daily. Swallow whole.    [provider]  busPIRone (BUSPAR) 10 MG tablet Take 1 tablet (10 mg total) by mouth daily as needed (for mood). 03/16/21   Lindell Spar, MD  carbamazepine (TEGRETOL) 200 MG tablet Take 1 tablet (200 mg total) by mouth in the morning and at bedtime. Patient not taking: Reported on 10/22/2021 07/22/21 02/17/22  Alric Ran, MD  diphenhydrAMINE (BENADRYL) 25 mg capsule Take 25 mg by mouth at bedtime as needed.    [provider]  hydrochlorothiazide (HYDRODIURIL) 25 MG tablet Take 0.5 tablets (12.5 mg total) by mouth daily. 10/22/21   Lindell Spar, MD  ibuprofen (ADVIL) 800 MG tablet Take 1 tablet (800 mg total) by mouth every 8 (eight) hours as needed. 10/22/21   Lindell Spar, MD  levothyroxine (SYNTHROID) 25 MCG tablet Take 25 mcg by mouth daily before breakfast.    [provider]  lisinopril (ZESTRIL) 40 MG tablet TAKE 1 TABLET(40 MG) BY MOUTH DAILY  06/01/21   Lindell Spar, MD  Psyllium (METAMUCIL FIBER PO) Take 2 capsules by mouth 3 (three) times daily.    [provider]  triamcinolone cream (KENALOG) 0.1 % Apply 1 application. topically 2 (two) times daily. 05/04/21   Lindell Spar, MD    Family History Family History  Problem Relation Age of Onset   Diabetes Mother    Diabetes Father    Diabetes Sister     Social History Social History   Tobacco Use   Smoking status: Never   Smokeless tobacco: Never  Vaping Use   Vaping Use: Never used  Substance Use Topics   Alcohol use: Yes    Comment: 1-2 drinks per 6 months   Drug use: Never     Allergies    Patient has no known allergies.   Review of Systems Review of Systems Per HPI  Physical Exam Triage Vital Signs ED Triage Vitals  Enc Vitals Group     BP 11/12/21 1341 (!) 183/82     Pulse Rate 11/12/21 1341 85     Resp 11/12/21 1341 20     Temp 11/12/21 1341 98.3 F (36.8 C)     Temp Source 11/12/21 1341 Oral     SpO2 11/12/21 1341 98 %     Weight --      Height --      Head Circumference --      Peak Flow --      Pain Score 11/12/21 1342 10     Pain Loc --      Pain Edu? --      Excl. in [place]? --    No data found.  Updated Vital Signs BP (!) 183/82 (BP Location: Right Arm)   Pulse 85   Temp 98.3 F (36.8 C) (Oral)   Resp 20   SpO2 98%   Visual Acuity Right Eye Distance:   Left Eye Distance:   Bilateral Distance:    Right Eye Near:   Left Eye Near:    Bilateral Near:     Physical Exam Vitals and nursing note reviewed.  Constitutional:      General: She is not in acute distress.    Appearance: Normal appearance. She is not toxic-appearing.  HENT:     Head: Normocephalic and atraumatic.     Mouth/Throat:     Mouth: Mucous membranes are moist.     Pharynx: Oropharynx is clear.  Eyes:     General: No scleral icterus.       Right eye: No discharge.        Left eye: No discharge.     Extraocular Movements: Extraocular movements intact.     Pupils: Pupils are equal, round, and reactive to light.  Neck:     Trachea: Trachea normal.   Pulmonary:     Effort: Pulmonary effort is normal. No respiratory distress.  Musculoskeletal:     Cervical back: Normal range of motion. No edema, erythema, signs of trauma or rigidity. Muscular tenderness present. No pain with movement or spinous process tenderness. Normal range of motion.  Lymphadenopathy:     Cervical:     Right cervical: No superficial, deep or posterior cervical adenopathy.    Left cervical: No superficial, deep or posterior cervical adenopathy.  Skin:    General: Skin is warm and dry.      Capillary Refill: Capillary refill takes less than 2 seconds.     Coloration: Skin is not jaundiced or pale.  Findings: No erythema.  Neurological:     General: No focal deficit present.     Mental Status: She is alert and oriented to person, place, and time.     Cranial Nerves: Cranial nerves 2-12 are intact.     Sensory: Sensation is intact.     Motor: Motor function is intact.     Coordination: Coordination is intact.  Psychiatric:        Behavior: Behavior is cooperative.      UC Treatments / Results  Labs (all labs ordered are listed, but only abnormal results are displayed) Labs Reviewed - No data to display  EKG   Radiology No results found.  Procedures Procedures (including critical care time)  Medications Ordered in UC Medications - No data to display  Initial Impression / Assessment and Plan / UC Course  I have reviewed the triage vital signs and the nursing notes.  Pertinent labs & imaging results that were available during my care of the patient were reviewed by me and considered in my medical decision making (see chart for details).   Patient is well-appearing, afebrile, not tachycardic, not tachypneic, oxygenating well on room air.  She is mildly hypertensive today, likely secondary to pain.  No vision changes, chest pain, shortness of breath, or dizziness on evaluation.  Trigeminal neuralgia pain Resume carbamazepine as previously prescribed X-ray imaging deferred as she follows with neurology and last had MRI/MRA in December 2022 and no new injury Encourage close follow-up with neurologist for discussion of other treatment options if she does not want to continue this medication ER and return precautions discussed  The patient was given the opportunity to ask questions.  All questions answered to their satisfaction.  The patient is in agreement to this plan.    Final Clinical Impressions(s) / UC Diagnoses   Final diagnoses:  Trigeminal neuralgia  pain     Discharge Instructions      Please resume the carbamazepine to help with the pain and follow up with your Neurologist to discuss other treatment options   ED Prescriptions   None    PDMP not reviewed this encounter.   Eulogio Bear, NP 11/12/21 [phone]

## 2021-11-12 NOTE — ED Triage Notes (Signed)
Pt reports right sided neck pain/stiffness x4 days. Pt reports has history of nerve damage in "head" and denies any known injury.

## 2021-11-12 NOTE — Discharge Instructions (Signed)
Please resume the carbamazepine to help with the pain and follow up with your Neurologist to discuss other treatment options

## 2021-12-02 ENCOUNTER — Telehealth: Payer: Self-pay | Admitting: Neurology

## 2021-12-02 NOTE — Telephone Encounter (Signed)
Pt is calling. Stated medication carbamazepine (TEGRETOL) 200 MG tablet is making her blow-up and gain weight. Pt said there has to be a better medication than this. Pt is requesting a call back from nurse.

## 2021-12-03 NOTE — Telephone Encounter (Signed)
Called pt. Left VM to call office and schedule sooner appointment with provider.

## 2021-12-03 NOTE — Telephone Encounter (Signed)
I reviewed pt's chart her last visit was in June and she is scheduled for f/u. Please call pt about moving her appt up to discuss other med options. I have placed 12/07/2021 at 1115 on hold for this pt.   Thank you!

## 2021-12-07 ENCOUNTER — Ambulatory Visit: Payer: Medicare HMO | Admitting: Podiatry

## 2021-12-07 DIAGNOSIS — B351 Tinea unguium: Secondary | ICD-10-CM

## 2021-12-07 DIAGNOSIS — M79674 Pain in right toe(s): Secondary | ICD-10-CM

## 2021-12-07 DIAGNOSIS — M79675 Pain in left toe(s): Secondary | ICD-10-CM

## 2021-12-07 DIAGNOSIS — I739 Peripheral vascular disease, unspecified: Secondary | ICD-10-CM

## 2021-12-07 DIAGNOSIS — M79671 Pain in right foot: Secondary | ICD-10-CM | POA: Diagnosis not present

## 2021-12-07 NOTE — Progress Notes (Unsigned)
  Subjective:  Patient ID: Nancy Schroeder, female    DOB: 1933/08/05,  MRN: [id number]  Nancy Schroeder presents to clinic today for at risk foot care. Patient has h/o PAD and painful elongated mycotic toenails 1-5 bilaterally which are tender when wearing enclosed shoe gear. Pain is relieved with periodic professional debridement.   Chief Complaint  Patient presents with   Nail Problem    RFC Bilateral nail trim Bilateral nail trim. Pt not diabetic.    New problem(s):  Patient relates new onset of right foot pain which started last night when she went to the bathroom. States she has tenderness near the lateral midfoot area. Denies any falls or episode(s) of trauma. At ED visit, she related she discontinued Tegretol (carbamazepine). She was recently diagnosed with trigeminal neuralgia and was instructed to resume carbamazepine. She does see Neurology.  PCP is Lindell Spar, MD , and last visit was October 22, 2021.  No Known Allergies  Review of Systems: Negative except as noted in the HPI.  Objective: No changes noted in today's physical examination.  Nancy Schroeder is a pleasant 88 y.o. female WD, WN in NAD. AAO x 3.  Neurovascular Examination: CFT delayed b/l LE. Faintly palpable pedal pulses b/l. Pedal hair absent. No pain with calf compression b/l. No edema noted b/l LE. No cyanosis or clubbing noted b/l LE.  Protective sensation intact 5/5 intact bilaterally with 10g monofilament b/l. Vibratory sensation intact b/l.  Dermatological:  Pedal integument with normal turgor, texture and tone b/l LE. No open wounds b/l. No interdigital macerations b/l. Toenails 1-5 b/l elongated, thickened, discolored with subungual debris. +Tenderness with dorsal palpation of nailplates. No hyperkeratotic or porokeratotic lesions present.  Musculoskeletal:  Normal muscle strength 5/5 to all lower extremity muscle groups bilaterally. HAV with bunion deformity noted b/l LE. Hammertoe  deformity noted 2-5 b/l.   She does have tenderness with palpation over 4th and 5th metatarsal shafts with no swelling, no erythema, no ecchymosis, no warmth.  Patient ambulates with cane assistance today.  Assessment/Plan: 1. Pain due to onychomycosis of toenails of both feet   2. Right foot pain   3. Peripheral vascular disease ([place])     No orders of the defined types were placed in this encounter.   -Examined patient. -No new findings. No new orders. -Discussed patient's symptoms today. Recommended xray and patient declined. Offered Darco shoe and ace wrap. Patient declined. Stated if condition continues, she will call office. -Continue supportive shoe gear daily. -Mycotic toenails 1-5 bilaterally were debrided in length and girth with sterile nail nippers and dremel without incident. -Patient/POA to call should there be question/concern in the interim.   Return in about 3 months (around 03/09/2022).  Marzetta Board, DPM

## 2021-12-08 ENCOUNTER — Encounter: Payer: Self-pay | Admitting: Podiatry

## 2022-01-08 ENCOUNTER — Other Ambulatory Visit: Payer: Self-pay | Admitting: Nurse Practitioner

## 2022-01-08 DIAGNOSIS — I10 Essential (primary) hypertension: Secondary | ICD-10-CM

## 2022-01-21 ENCOUNTER — Ambulatory Visit: Payer: Medicare HMO | Admitting: Neurology

## 2022-02-25 ENCOUNTER — Ambulatory Visit (INDEPENDENT_AMBULATORY_CARE_PROVIDER_SITE_OTHER): Payer: Medicare HMO | Admitting: Internal Medicine

## 2022-02-25 ENCOUNTER — Encounter: Payer: Self-pay | Admitting: Internal Medicine

## 2022-02-25 VITALS — BP 123/77 | HR 89 | Ht 60.0 in | Wt 173.4 lb

## 2022-02-25 DIAGNOSIS — G500 Trigeminal neuralgia: Secondary | ICD-10-CM

## 2022-02-25 DIAGNOSIS — R5383 Other fatigue: Secondary | ICD-10-CM

## 2022-02-25 DIAGNOSIS — E559 Vitamin D deficiency, unspecified: Secondary | ICD-10-CM | POA: Diagnosis not present

## 2022-02-25 DIAGNOSIS — I10 Essential (primary) hypertension: Secondary | ICD-10-CM | POA: Diagnosis not present

## 2022-02-25 DIAGNOSIS — M5412 Radiculopathy, cervical region: Secondary | ICD-10-CM | POA: Diagnosis not present

## 2022-02-25 DIAGNOSIS — E039 Hypothyroidism, unspecified: Secondary | ICD-10-CM

## 2022-02-25 DIAGNOSIS — F411 Generalized anxiety disorder: Secondary | ICD-10-CM

## 2022-02-25 MED ORDER — HYDROCHLOROTHIAZIDE 12.5 MG PO TABS: 12.5000 mg | ORAL_TABLET | Freq: Every day | ORAL | 1 refills | Status: DC

## 2022-02-25 NOTE — Assessment & Plan Note (Signed)
Advised to take Vitamin D 5000 IU QD 

## 2022-02-25 NOTE — Assessment & Plan Note (Addendum)
H/o cervical spine surgery Has chronic neck pain, radiating to b/l arms Needs to avoid using soft pillows, could be worse from sleep posture Tylenol PRN for pain

## 2022-02-25 NOTE — Assessment & Plan Note (Signed)
Overall well-controlled with BuSpar 10 mg BID PRN 

## 2022-02-25 NOTE — Assessment & Plan Note (Signed)
Chronic fatigue, initially had improved with levothyroxine for hypothyroidism She complains of fatigue now Checked CMP, TSH, free T4, vitamin D and B12 Decreased dose of HCTZ to avoid risk of dehydration

## 2022-02-25 NOTE — Progress Notes (Signed)
Established Patient Office Visit  Subjective:  Patient ID: Nancy Schroeder, female    DOB: 1933/02/28  Age: 89 y.o. MRN: [id number]  CC:  Chief Complaint  Patient presents with   Hypertension    Four month follow up on hypertension. She feels the blood pressure medication is making her light headed    HPI Nancy Schroeder is a 89 y.o. female with past medical history of HTN, hypothyroidism, trigeminal neuralgia and obesity who presents for f/u of her chronic medical conditions.  HTN: BP is well-controlled.  She currently takes 2 medicines out of 3 -amlodipine, lisinopril and HCTZ.  She is unclear about which 2.  She reports chronic fatigue and mild dizziness and attributes it to blood pressure medicines.  Of note, her BP has been elevated in other providers office visits and recent ER visit.  Patient denies headache, chest pain, dyspnea or palpitations.   Chronic fatigue and hypothyroidism: Her TSH was borderline elevated in 12/2020 and was started on Levothyroxine.  She has started taking her levothyroxine since then.  Her fatigue had slightly improved, but she c/o fatigue now.  Her weight has been stable as well.  Trigeminal neuralgia: She was started on Tegretol, and had visit with neurologist, but she had stopped taking Tegretol now as she is concerned about its effects - facial and and arm swelling written on paper. She herself had tolerated it well.  Today, she states that she has been taking Tegretol daily for now.  She prefers to avoid evening dose as she had drowsiness with it.  She reports left-sided neck pain towards the shoulder, which is chronic and worse in the morning.  Denies any recent injury.  She has radiating pain in her left arm as well.  Denies any numbness or tingling of the UE.  Past Medical History:  Diagnosis Date   Anxiety    Depression    Essential hypertension    H/O back injury 2016    Past Surgical History:  Procedure Laterality Date   ABDOMINAL  HYSTERECTOMY     total   CATARACT EXTRACTION, BILATERAL  01/2020   CHOLECYSTECTOMY     NECK SURGERY      Family History  Problem Relation Age of Onset   Diabetes Mother    Diabetes Father    Diabetes Sister     Social History   Socioeconomic History   Marital status: Widowed    Spouse name: Not on file   Number of children: 3   Years of education: Not on file   Highest education level: 8th grade  Occupational History   Occupation: retired  Tobacco Use   Smoking status: Never   Smokeless tobacco: Never  Vaping Use   Vaping Use: Never used  Substance and Sexual Activity   Alcohol use: Yes    Comment: 1-2 drinks per 6 months   Drug use: Never   Sexual activity: Not Currently    Birth control/protection: Surgical, Post-menopausal  Other Topics Concern   Not on file  Social History Narrative   Live with Insurance underwriter (middle)   Has 3 sons   Oldest lives in [place] lives in [place] (with 1 grandchild)   3 cats: lily-16 , troubles, whiskers (boys-5 years)      Enjoyed sewing, and knitting, but back injury made this worse. Read, tv, putt around, likes astrology      Diet: Eats what she wants. Enjoys veggies and fruits   Caffeine: 1-2 cups of coffee, iced  tea   Water: 6-8 cups daily       Wear seatbelt   Does not wear sunscreen   Smoke and carbon monoxide detectors         Social Determinants of Health   Financial Resource Strain: Medium Risk (08/29/2020)   Overall Financial Resource Strain (CARDIA)    Difficulty of Paying Living Expenses: Somewhat hard  Food Insecurity: No Food Insecurity (09/14/2021)   Hunger Vital Sign    Worried About Running Out of Food in the Last Year: Never true    Ran Out of Food in the Last Year: Never true  Transportation Needs: No Transportation Needs (09/14/2021)   PRAPARE - Hydrologist (Medical): No    Lack of Transportation (Non-Medical): No  Physical Activity: Inactive (09/14/2021)    Exercise Vital Sign    Days of Exercise per Week: 0 days    Minutes of Exercise per Session: 0 min  Stress: No Stress Concern Present (08/29/2020)   [place]    Feeling of Stress : Not at all  Social Connections: Socially Isolated (08/29/2020)   Social Connection and Isolation Panel [NHANES]    Frequency of Communication with Friends and Family: Once a week    Frequency of Social Gatherings with Friends and Family: Once a week    Attends Religious Services: Never    Marine scientist or Organizations: No    Attends Archivist Meetings: Never    Marital Status: Widowed  Intimate Partner Violence: Not At Risk (08/29/2020)   Humiliation, Afraid, Rape, and Kick questionnaire    Fear of Current or Ex-Partner: No    Emotionally Abused: No    Physically Abused: No    Sexually Abused: No    Outpatient Medications Prior to Visit  Medication Sig Dispense Refill   FLUZONE HIGH-DOSE QUADRIVALENT 0.7 ML SUSY      SHINGRIX injection      amLODipine (NORVASC) 5 MG tablet Take 1 tablet (5 mg total) by mouth daily. 90 tablet 3   aspirin EC 81 MG tablet Take 81 mg by mouth daily. Swallow whole.     busPIRone (BUSPAR) 10 MG tablet Take 1 tablet (10 mg total) by mouth daily as needed (for mood). 180 tablet 1   carbamazepine (TEGRETOL) 200 MG tablet Take 1 tablet (200 mg total) by mouth in the morning and at bedtime. (Patient not taking: Reported on 10/22/2021) 60 tablet 6   diphenhydrAMINE (BENADRYL) 25 mg capsule Take 25 mg by mouth at bedtime as needed.     ibuprofen (ADVIL) 800 MG tablet Take 1 tablet (800 mg total) by mouth every 8 (eight) hours as needed. 30 tablet 1   levothyroxine (SYNTHROID) 25 MCG tablet Take 25 mcg by mouth daily before breakfast.     lisinopril (ZESTRIL) 40 MG tablet TAKE 1 TABLET(40 MG) BY MOUTH DAILY 90 tablet 1   Psyllium (METAMUCIL FIBER PO) Take 2 capsules by mouth 3 (three) times daily.      triamcinolone cream (KENALOG) 0.1 % Apply 1 application. topically 2 (two) times daily. 30 g 0   hydrochlorothiazide (HYDRODIURIL) 25 MG tablet TAKE 1 TABLET(25 MG) BY MOUTH DAILY 90 tablet 1   No facility-administered medications prior to visit.    No Known Allergies  ROS Review of Systems  Constitutional:  Positive for fatigue. Negative for chills and fever.  HENT:  Negative for sinus pressure, sinus pain and sore throat.  Eyes:  Negative for pain and discharge.  Respiratory:  Negative for cough and shortness of breath.   Cardiovascular:  Negative for chest pain and palpitations.  Gastrointestinal:  Negative for abdominal pain, constipation, diarrhea, nausea and vomiting.  Endocrine: Negative for polydipsia and polyuria.  Genitourinary:  Negative for dysuria and hematuria.  Musculoskeletal:  Positive for back pain and neck pain. Negative for neck stiffness.  Skin:  Negative for rash.  Neurological:  Negative for dizziness and weakness.       Twitching of left foot toes  Psychiatric/Behavioral:  Negative for agitation and behavioral problems.       Objective:    Physical Exam Vitals reviewed.  Constitutional:      General: She is not in acute distress.    Appearance: She is not diaphoretic.  HENT:     Head: Normocephalic and atraumatic.     Nose: No congestion.     Mouth/Throat:     Mouth: Mucous membranes are moist.     Pharynx: No posterior oropharyngeal erythema.  Eyes:     General: No scleral icterus.    Extraocular Movements: Extraocular movements intact.  Cardiovascular:     Rate and Rhythm: Normal rate and regular rhythm.     Pulses: Normal pulses.     Heart sounds: Normal heart sounds. No murmur heard. Pulmonary:     Breath sounds: Normal breath sounds. No wheezing or rales.  Musculoskeletal:        General: Tenderness (Right paraspinal (lumbar) area) present.     Cervical back: Neck supple. No tenderness.     Right lower leg: No edema.     Left lower  leg: No edema.  Skin:    General: Skin is warm.     Findings: No rash.  Neurological:     General: No focal deficit present.     Mental Status: She is alert and oriented to person, place, and time.     Sensory: No sensory deficit.     Motor: No weakness.  Psychiatric:        Mood and Affect: Mood normal.        Behavior: Behavior normal.     BP 123/77 (BP Location: Left Arm, Patient Position: Sitting, Cuff Size: Normal)   Pulse 89   Ht 5' (1.524 m)   Wt 173 lb 6.4 oz (78.7 kg)   SpO2 95%   BMI 33.86 kg/m  Wt Readings from Last 3 Encounters:  02/25/22 173 lb 6.4 oz (78.7 kg)  10/22/21 183 lb 9.6 oz (83.3 kg)  07/22/21 180 lb (81.6 kg)    Lab Results  Component Value Date   TSH 3.110 10/22/2021   Lab Results  Component Value Date   WBC 11.2 (H) 10/22/2021   HGB 13.7 10/22/2021   HCT 40.4 10/22/2021   MCV 93 10/22/2021   PLT 319 10/22/2021   Lab Results  Component Value Date   NA 143 10/22/2021   K 4.3 10/22/2021   CO2 20 10/22/2021   GLUCOSE 126 (H) 10/22/2021   BUN 9 10/22/2021   CREATININE 0.72 10/22/2021   BILITOT 0.3 10/22/2021   ALKPHOS 79 10/22/2021   AST 50 (H) 10/22/2021   ALT 43 (H) 10/22/2021   PROT 6.5 10/22/2021   ALBUMIN 4.1 10/22/2021   CALCIUM 9.6 10/22/2021   ANIONGAP 10 04/16/2020   EGFR 82 10/22/2021   Lab Results  Component Value Date   CHOL 167 10/01/2020   Lab Results  Component Value Date  HDL 45 10/01/2020   Lab Results  Component Value Date   LDLCALC 102 (H) 10/01/2020   Lab Results  Component Value Date   TRIG 108 10/01/2020   Lab Results  Component Value Date   CHOLHDL 3.7 10/01/2020   Lab Results  Component Value Date   HGBA1C 6.1 (H) 10/22/2021      Assessment & Plan:   Problem List Items Addressed This Visit       Cardiovascular and Mediastinum   Essential hypertension - Primary    BP Readings from Last 1 Encounters:  02/25/22 123/77  Well-controlled Is supposed to be taking lisinopril, HCTZ and  amlodipine Considering her well-controlled BP today and mild dizziness, will decrease dose of HCTZ to 12.5 mg QD Written instructions provided about current antihypertensives Counseled for compliance with the medications Advised DASH diet and moderate exercise/walking as tolerated      Relevant Medications   hydrochlorothiazide (HYDRODIURIL) 12.5 MG tablet     Endocrine   Hypothyroidism    Lab Results  Component Value Date   TSH 3.110 10/22/2021    Has chronic fatigue Check TSH and free T4 Had stopped NP thyroid, was started on Levothyroxine 25 mcg QD        Nervous and Auditory   Trigeminal neuralgia pain    Headache description likely suggestive of trigeminal neuralgia Takes Tegretol 200 mg QD for now Has been evaluated by neurology, advised to contact neurology to discuss about trigeminal neuralgia and about her left foot twitching      Cervical radiculopathy    H/o cervical spine surgery Has chronic neck pain, radiating to b/l arms Needs to avoid using soft pillows, could be worse from sleep posture Tylenol PRN for pain        Other   Fatigue    Chronic fatigue, initially had improved with levothyroxine for hypothyroidism She complains of fatigue now Checked CMP, TSH, free T4, vitamin D and B12 Decreased dose of HCTZ to avoid risk of dehydration      Vitamin D deficiency    Advised to take Vitamin D 5000 IU QD      GAD (generalized anxiety disorder)    Overall well-controlled with BuSpar 10 mg BID PRN       Meds ordered this encounter  Medications   hydrochlorothiazide (HYDRODIURIL) 12.5 MG tablet    Sig: Take 1 tablet (12.5 mg total) by mouth daily.    Dispense:  90 tablet    Refill:  1    Dose change    Follow-up: Return in about 4 weeks (around 03/25/2022) for HTN.    Lindell Spar, MD

## 2022-02-25 NOTE — Patient Instructions (Addendum)
Please start taking HCTZ 12.5 mg instead of 25 mg.  Please continue taking other medications as prescribed.  Please continue to follow low salt diet and ambulate as tolerated.  Please bring your medications in the next visit.

## 2022-02-25 NOTE — Assessment & Plan Note (Signed)
Lab Results  Component Value Date   TSH 3.110 10/22/2021   Has chronic fatigue Check TSH and free T4 Had stopped NP thyroid, was started on Levothyroxine 25 mcg QD 

## 2022-02-25 NOTE — Assessment & Plan Note (Signed)
Headache description likely suggestive of trigeminal neuralgia Takes Tegretol 200 mg QD for now Has been evaluated by neurology, advised to contact neurology to discuss about trigeminal neuralgia and about her left foot twitching

## 2022-02-25 NOTE — Assessment & Plan Note (Addendum)
BP Readings from Last 1 Encounters:  02/25/22 123/77   Well-controlled Is supposed to be taking lisinopril, HCTZ and amlodipine Considering her well-controlled BP today and mild dizziness, will decrease dose of HCTZ to 12.5 mg QD Written instructions provided about current antihypertensives Counseled for compliance with the medications Advised DASH diet and moderate exercise/walking as tolerated

## 2022-03-02 ENCOUNTER — Other Ambulatory Visit: Payer: Self-pay

## 2022-03-02 ENCOUNTER — Telehealth: Payer: Self-pay | Admitting: Internal Medicine

## 2022-03-02 NOTE — Telephone Encounter (Signed)
Nancy Schroeder called needs a referral for home health nurse to come out to help patient at her home to give patient baths. Home health contact information [employer]  # [phone]. Please contact patient Nancy back ASAP

## 2022-03-02 NOTE — Telephone Encounter (Signed)
[facility]

## 2022-03-03 NOTE — Telephone Encounter (Signed)
Per [facility], they do not accept patients insurance, no referral placed. Tried to call son to explain

## 2022-03-08 ENCOUNTER — Other Ambulatory Visit: Payer: Self-pay | Admitting: Nurse Practitioner

## 2022-03-13 DIAGNOSIS — E669 Obesity, unspecified: Secondary | ICD-10-CM | POA: Diagnosis not present

## 2022-03-13 DIAGNOSIS — R252 Cramp and spasm: Secondary | ICD-10-CM | POA: Diagnosis not present

## 2022-03-13 DIAGNOSIS — M79606 Pain in leg, unspecified: Secondary | ICD-10-CM | POA: Diagnosis not present

## 2022-03-13 DIAGNOSIS — Z6834 Body mass index (BMI) 34.0-34.9, adult: Secondary | ICD-10-CM | POA: Diagnosis not present

## 2022-03-13 DIAGNOSIS — M549 Dorsalgia, unspecified: Secondary | ICD-10-CM | POA: Diagnosis not present

## 2022-03-13 DIAGNOSIS — M62830 Muscle spasm of back: Secondary | ICD-10-CM | POA: Diagnosis not present

## 2022-03-23 ENCOUNTER — Ambulatory Visit
Admission: EM | Admit: 2022-03-23 | Discharge: 2022-03-23 | Disposition: A | Discharge status: Home / Self Care | Payer: Medicare HMO | Attending: Nurse Practitioner | Admitting: Nurse Practitioner

## 2022-03-23 DIAGNOSIS — M79605 Pain in left leg: Secondary | ICD-10-CM | POA: Diagnosis not present

## 2022-03-23 DIAGNOSIS — M62830 Muscle spasm of back: Secondary | ICD-10-CM

## 2022-03-23 DIAGNOSIS — R21 Rash and other nonspecific skin eruption: Secondary | ICD-10-CM | POA: Diagnosis not present

## 2022-03-23 DIAGNOSIS — L259 Unspecified contact dermatitis, unspecified cause: Secondary | ICD-10-CM

## 2022-03-23 MED ORDER — TIZANIDINE HCL 2 MG PO TABS: 2.0000 mg | ORAL_TABLET | Freq: Three times a day (TID) | ORAL | 0 refills | Status: DC | PRN

## 2022-03-23 MED ORDER — TRIAMCINOLONE ACETONIDE 0.1 % EX CREA: 1.0000 | TOPICAL_CREAM | Freq: Two times a day (BID) | CUTANEOUS | 0 refills | Status: DC

## 2022-03-23 NOTE — Discharge Instructions (Signed)
As we discussed, I do not think the pain in your left leg is a blood clot.  If you develop redness, a lot of swelling, or red streaks in your legs, go to the emergency room.  For the muscle pain in your back, you can take the tizanidine every 8 hours as needed.  Be careful because this may make you drowsy.  For the rash on your left leg, you can use the triamcinolone cream that I have sent to the pharmacy to help dry it out.  Please start moisturizing your legs daily to help with dryness.

## 2022-03-23 NOTE — ED Triage Notes (Signed)
Pt reports lower  left leg sensitivity and small red rash x 2 weeks.

## 2022-03-23 NOTE — ED Provider Notes (Signed)
[facility]    CSN: [id number] Arrival date & time: 03/23/22  1006      History   Chief Complaint No chief complaint on file.   HPI Nancy Schroeder is a 89 y.o. female.   Patient presents today for sensitivity to the outside of her left leg for the past couple of weeks.  Reports tenderness with the sheet touching it at nighttime.  It does not hurt to walk.  She denies redness, swelling, bruising.  No recent fall, accident, injury, or trauma to the leg.  Reports her friend told her she needed to come and be checked for a blood clot.  No chest pain, shortness of breath, lightheadedness or dizziness.  No recent surgery or long travel.  Also concerned about a rash that she has on the same leg.  Reports it is red and itchy.  No recent change in detergents, soaps, personal care products.  She has been scratching at it which she thinks has made it worse.  No throat or tongue itching, shortness of breath, or throat or tongue swelling.  Also concerned about pain in the mid back.  Reports that she was seen in an alternate urgent care and was told she had muscle spasms in given 2 pills of something she cannot name.  She denies recent injury, trauma, fall, or accident that affected her back.  No weakness or numbness/tingling in extremities.  No swelling, redness, or bruising in the back.      Past Medical History:  Diagnosis Date   Anxiety    Depression    Essential hypertension    H/O back injury 2016    Patient Active Problem List   Diagnosis Date Noted   Vitamin D deficiency 02/25/2022   GAD (generalized anxiety disorder) 02/25/2022   Cervical radiculopathy 02/25/2022   Lumbar radiculopathy 05/07/2021   Contact dermatitis 05/07/2021   Atypical mole 05/07/2021   Encounter for general adult medical examination with abnormal findings 10/01/2020   Trigeminal neuralgia pain 10/01/2020   Pulmonary nodule 04/29/2020   Fatigue 04/29/2020   Exertional dyspnea 04/21/2020    Hypothyroidism 04/21/2020   Forgetfulness 02/06/2019   Acute midline thoracic back pain 02/06/2019   Obesity (BMI 30.0-34.9) 02/06/2019   Pain due to onychomycosis of toenails of both feet 09/01/2018   Essential hypertension 02/20/2007    Past Surgical History:  Procedure Laterality Date   ABDOMINAL HYSTERECTOMY     total   CATARACT EXTRACTION, BILATERAL  01/2020   CHOLECYSTECTOMY     NECK SURGERY      OB History   No obstetric history on file.      Home Medications    Prior to Admission medications   Medication Sig Start Date End Date Taking? Authorizing Provider  tiZANidine (ZANAFLEX) 2 MG tablet Take 1 tablet (2 mg total) by mouth every 8 (eight) hours as needed for muscle spasms. Do not take with alcohol or while driving or operating heavy machinery.  May cause drowsiness. 03/23/22  Yes Noemi Chapel A, NP  amLODipine (NORVASC) 5 MG tablet Take 1 tablet (5 mg total) by mouth daily. 10/22/21   Lindell Spar, MD  aspirin EC 81 MG tablet Take 81 mg by mouth daily. Swallow whole.    [provider]  busPIRone (BUSPAR) 10 MG tablet Take 1 tablet (10 mg total) by mouth daily as needed (for mood). 03/16/21   Lindell Spar, MD  carbamazepine (TEGRETOL) 200 MG tablet Take 1 tablet (200 mg total) by mouth  in the morning and at bedtime. Patient not taking: Reported on 10/22/2021 07/22/21 02/17/22  Alric Ran, MD  diphenhydrAMINE (BENADRYL) 25 mg capsule Take 25 mg by mouth at bedtime as needed.    [provider]  hydrochlorothiazide (HYDRODIURIL) 12.5 MG tablet Take 1 tablet (12.5 mg total) by mouth daily. 02/25/22   Lindell Spar, MD  ibuprofen (ADVIL) 800 MG tablet Take 1 tablet (800 mg total) by mouth every 8 (eight) hours as needed. 10/22/21   Lindell Spar, MD  levothyroxine (SYNTHROID) 25 MCG tablet Take 25 mcg by mouth daily before breakfast.    [provider]  lisinopril (ZESTRIL) 40 MG tablet TAKE 1 TABLET(40 MG) BY MOUTH DAILY 06/01/21    Lindell Spar, MD  Psyllium (METAMUCIL FIBER PO) Take 2 capsules by mouth 3 (three) times daily.    [provider]  triamcinolone cream (KENALOG) 0.1 % Apply 1 Application topically 2 (two) times daily. 03/23/22   Eulogio Bear, NP    Family History Family History  Problem Relation Age of Onset   Diabetes Mother    Diabetes Father    Diabetes Sister     Social History Social History   Tobacco Use   Smoking status: Never   Smokeless tobacco: Never  Vaping Use   Vaping Use: Never used  Substance Use Topics   Alcohol use: Yes    Comment: 1-2 drinks per 6 months   Drug use: Never     Allergies   Patient has no known allergies.   Review of Systems Review of Systems Per HPI  Physical Exam Triage Vital Signs ED Triage Vitals  Enc Vitals Group     BP 03/23/22 1056 (!) 163/85     Pulse Rate 03/23/22 1056 87     Resp 03/23/22 1056 20     Temp 03/23/22 1056 97.8 F (36.6 C)     Temp Source 03/23/22 1056 Oral     SpO2 03/23/22 1056 94 %     Weight --      Height --      Head Circumference --      Peak Flow --      Pain Score 03/23/22 1058 0     Pain Loc --      Pain Edu? --      Excl. in [place]? --    No data found.  Updated Vital Signs BP (!) 163/85 (BP Location: Right Arm)   Pulse 87   Temp 97.8 F (36.6 C) (Oral)   Resp 20   SpO2 94%   Visual Acuity Right Eye Distance:   Left Eye Distance:   Bilateral Distance:    Right Eye Near:   Left Eye Near:    Bilateral Near:     Physical Exam Vitals and nursing note reviewed.  Constitutional:      General: She is not in acute distress.    Appearance: Normal appearance. She is not toxic-appearing.  HENT:     Head: Normocephalic and atraumatic.     Mouth/Throat:     Mouth: Mucous membranes are moist.     Pharynx: Oropharynx is clear.  Pulmonary:     Effort: Pulmonary effort is normal. No respiratory distress.  Musculoskeletal:       Back:     Right lower leg: No edema.     Left lower  leg: No edema.       Legs:     Comments: Tenderness of paraspinal muscles in area  marked.  No obvious swelling, redness, deformity, or bruising.  Pain to light touch of left lower extremity in area marked; no obvious deformity, redness, bruising, or swelling.  No calf tenderness, Homans' sign negative.  Full ROM and sensation.  Lower extremities neurovascularly intact bilaterally.  Skin:    General: Skin is warm and dry.     Capillary Refill: Capillary refill takes less than 2 seconds.     Findings: Erythema and rash present.  Neurological:     Mental Status: She is alert and oriented to person, place, and time.  Psychiatric:        Behavior: Behavior is cooperative.      UC Treatments / Results  Labs (all labs ordered are listed, but only abnormal results are displayed) Labs Reviewed - No data to display  EKG   Radiology No results found.  Procedures Procedures (including critical care time)  Medications Ordered in UC Medications - No data to display  Initial Impression / Assessment and Plan / UC Course  I have reviewed the triage vital signs and the nursing notes.  Pertinent labs & imaging results that were available during my care of the patient were reviewed by me and considered in my medical decision making (see chart for details).   Patient is well-appearing, afebrile, not tachycardic, not tachypneic, oxygenating well on room air.  Patient is mildly hypertensive in triage.  1. Left leg pain Unclear etiology; suspect possible neuropathy from chronic trigeminal condition Examination and vital signs are reassuring today Discussed with patient that DVT risk is low-Wells score is -2 Recommended use of Tylenol 500 to 1000 mg every 6 hours as needed for pain Signs and symptoms of DVT reviewed with patient and ER precautions discussed  2. Rash and nonspecific skin eruption Recommended avoidance of itching/scratching area Start daily moisturizer With no improvement,  start topical triamcinolone cream Seek care with persistent or worsening symptoms despite treatment  3. Muscle spasm of back Start tizanidine for as needed pain/spasming ER and return precautions discussed with patient  The patient was given the opportunity to ask questions.  All questions answered to their satisfaction.  The patient is in agreement to this plan.    Final Clinical Impressions(s) / UC Diagnoses   Final diagnoses:  Left leg pain  Rash and nonspecific skin eruption  Muscle spasm of back     Discharge Instructions      As we discussed, I do not think the pain in your left leg is a blood clot.  If you develop redness, a lot of swelling, or red streaks in your legs, go to the emergency room.  For the muscle pain in your back, you can take the tizanidine every 8 hours as needed.  Be careful because this may make you drowsy.  For the rash on your left leg, you can use the triamcinolone cream that I have sent to the pharmacy to help dry it out.  Please start moisturizing your legs daily to help with dryness.    ED Prescriptions     Medication Sig Dispense Auth. Provider   triamcinolone cream (KENALOG) 0.1 % Apply 1 Application topically 2 (two) times daily. 15 g Noemi Chapel A, NP   tiZANidine (ZANAFLEX) 2 MG tablet Take 1 tablet (2 mg total) by mouth every 8 (eight) hours as needed for muscle spasms. Do not take with alcohol or while driving or operating heavy machinery.  May cause drowsiness. 30 tablet Eulogio Bear, NP  PDMP not reviewed this encounter.   Eulogio Bear, NP 03/23/22 1308

## 2022-03-29 ENCOUNTER — Other Ambulatory Visit: Payer: Self-pay | Admitting: Internal Medicine

## 2022-03-29 ENCOUNTER — Other Ambulatory Visit: Payer: Self-pay

## 2022-03-29 MED ORDER — LEVOTHYROXINE SODIUM 25 MCG PO TABS: 25.0000 ug | ORAL_TABLET | Freq: Every day | ORAL | 0 refills | Status: DC

## 2022-03-30 ENCOUNTER — Ambulatory Visit: Payer: Medicare HMO | Admitting: Podiatry

## 2022-03-30 ENCOUNTER — Encounter: Payer: Self-pay | Admitting: Podiatry

## 2022-03-30 DIAGNOSIS — I739 Peripheral vascular disease, unspecified: Secondary | ICD-10-CM

## 2022-03-30 DIAGNOSIS — B351 Tinea unguium: Secondary | ICD-10-CM | POA: Diagnosis not present

## 2022-03-30 DIAGNOSIS — M79675 Pain in left toe(s): Secondary | ICD-10-CM | POA: Diagnosis not present

## 2022-03-30 DIAGNOSIS — M79674 Pain in right toe(s): Secondary | ICD-10-CM | POA: Diagnosis not present

## 2022-03-30 NOTE — Progress Notes (Unsigned)
  Subjective:  Patient ID: Nancy Schroeder, female    DOB: 1933-12-27,  MRN: [id number]  Nancy Schroeder presents to clinic today for at risk foot care. Patient has h/o PAD and painful thick toenails that are difficult to trim. Pain interferes with ambulation. Aggravating factors include wearing enclosed shoe gear. Pain is relieved with periodic professional debridement.  Chief Complaint  Patient presents with   Nail Problem    [facility] PCP-[facility] PCP V[facility]-"Couple weeks ago"   New problem(s): None.   PCP is Lindell Spar, MD.  No Known Allergies  Review of Systems: Negative except as noted in the HPI.  Objective:   Patient refused blood pressure reading this morning.  Nancy Schroeder is a pleasant 89 y.o. female WD, WN in NAD. AAO x 3.  Neurovascular Examination: CFT delayed b/l LE. Faintly palpable pedal pulses b/l. Pedal hair absent. No pain with calf compression b/l. No edema noted b/l LE. No cyanosis or clubbing noted b/l LE.  Protective sensation intact 5/5 intact bilaterally with 10g monofilament b/l. Vibratory sensation intact b/l.  Dermatological:  Pedal integument with normal turgor, texture and tone b/l LE. No open wounds b/l. No interdigital macerations b/l. Toenails 1-5 b/l elongated, thickened, discolored with subungual debris. +Tenderness with dorsal palpation of nailplates. No hyperkeratotic or porokeratotic lesions present.  Musculoskeletal:  Normal muscle strength 5/5 to all lower extremity muscle groups bilaterally. HAV with bunion deformity noted b/l LE. Hammertoe deformity noted 2-5 b/l.   Assessment/Plan: 1. Pain due to onychomycosis of toenails of both feet   2. Peripheral vascular disease ([place])    -Patient was evaluated and treated. All patient's and/or POA's questions/concerns answered on today's visit. -Patient refused blood pressure reading this am stating the cuff hurts her arm. -Continue supportive shoe gear daily. -Toenails 1-5 b/l were  debrided in length and girth with sterile nail nippers and dremel without iatrogenic bleeding.  -Patient/POA to call should there be question/concern in the interim.   Return in about 3 months (around 06/28/2022).  Marzetta Board, DPM

## 2022-03-31 ENCOUNTER — Telehealth: Payer: Self-pay | Admitting: Internal Medicine

## 2022-03-31 NOTE — Telephone Encounter (Signed)
Cap program forms   Noted Copied Sleeved  Call patient when forms are ready.

## 2022-04-07 ENCOUNTER — Encounter: Payer: Self-pay | Admitting: Internal Medicine

## 2022-04-07 ENCOUNTER — Ambulatory Visit (INDEPENDENT_AMBULATORY_CARE_PROVIDER_SITE_OTHER): Payer: Medicare HMO | Admitting: Internal Medicine

## 2022-04-07 VITALS — BP 146/72 | HR 89 | Ht 60.0 in | Wt 178.2 lb

## 2022-04-07 DIAGNOSIS — F411 Generalized anxiety disorder: Secondary | ICD-10-CM | POA: Diagnosis not present

## 2022-04-07 DIAGNOSIS — G500 Trigeminal neuralgia: Secondary | ICD-10-CM | POA: Diagnosis not present

## 2022-04-07 DIAGNOSIS — M5416 Radiculopathy, lumbar region: Secondary | ICD-10-CM | POA: Diagnosis not present

## 2022-04-07 DIAGNOSIS — I10 Essential (primary) hypertension: Secondary | ICD-10-CM

## 2022-04-07 DIAGNOSIS — E039 Hypothyroidism, unspecified: Secondary | ICD-10-CM

## 2022-04-07 DIAGNOSIS — G609 Hereditary and idiopathic neuropathy, unspecified: Secondary | ICD-10-CM | POA: Insufficient documentation

## 2022-04-07 NOTE — Assessment & Plan Note (Signed)
Headache description likely suggestive of trigeminal neuralgia Takes Tegretol 200 mg QD for now Has been evaluated by neurology

## 2022-04-07 NOTE — Assessment & Plan Note (Signed)
BP Readings from Last 1 Encounters:  04/07/22 (!) 156/70   Uncontrolled Is supposed to be taking lisinopril, HCTZ and amlodipine Considering her well-controlled BP in the last visit and mild dizziness, decreased dose of HCTZ to 12.5 mg QD Home medications reviewed - needs to start taking Amlodipine Counseled for compliance with the medications Advised DASH diet and moderate exercise/walking as tolerated

## 2022-04-07 NOTE — Assessment & Plan Note (Signed)
She has numbness and twitching of the feet She also reports diffuse pain in arms and forearms Likely due to peripheral neuropathy Have advised her to discuss with neurologist, but she does not want to go to get [facility] - she is taking Tegretol for trigeminal neuralgia, which can also help with peripheral neuropathy

## 2022-04-07 NOTE — Progress Notes (Signed)
Established Patient Office Visit  Subjective:  Patient ID: Nancy Schroeder, female    DOB: July 05, 1933  Age: 89 y.o. MRN: [id number]  CC:  Chief Complaint  Patient presents with   Hypertension    Four week follow up. Patient is feeling lightheaded today.    HPI Shayann Medlock is a 89 y.o. female with past medical history of HTN, hypothyroidism, trigeminal neuralgia and obesity who presents for f/u of her chronic medical conditions.  HTN: BP is elevated today.  She currently takes 2 medicines out of 3 -amlodipine, lisinopril and HCTZ.  She has not been taking Amlodipine.  She reports chronic fatigue and mild dizziness and attributes it to blood pressure medicines.  Of note, her BP has been elevated in other providers office visits and recent ER visit.  Patient denies headache, chest pain, dyspnea or palpitations.   Chronic fatigue and hypothyroidism: Her TSH was borderline elevated in 12/2020 and was started on Levothyroxine.  She has started taking her levothyroxine since then.  Her fatigue had slightly improved, but she c/o fatigue now.  Her weight has been stable as well. Her TSH was wnl in 09/23.  Trigeminal neuralgia: She was started on Tegretol, and had visit with neurologist, but she had stopped taking Tegretol now as she is concerned about its effects - facial and and arm swelling written on paper. She herself had tolerated it well.  Today, she states that she has been taking Tegretol daily for now.  She prefers to avoid BID dose as she had drowsiness with it.  Past Medical History:  Diagnosis Date   Anxiety    Depression    Essential hypertension    H/O back injury 2016    Past Surgical History:  Procedure Laterality Date   ABDOMINAL HYSTERECTOMY     total   CATARACT EXTRACTION, BILATERAL  01/2020   CHOLECYSTECTOMY     NECK SURGERY      Family History  Problem Relation Age of Onset   Diabetes Mother    Diabetes Father    Diabetes Sister     Social History    Socioeconomic History   Marital status: Widowed    Spouse name: Not on file   Number of children: 3   Years of education: Not on file   Highest education level: 8th grade  Occupational History   Occupation: retired  Tobacco Use   Smoking status: Never   Smokeless tobacco: Never  Vaping Use   Vaping Use: Never used  Substance and Sexual Activity   Alcohol use: Yes    Comment: 1-2 drinks per 6 months   Drug use: Never   Sexual activity: Not Currently    Birth control/protection: Surgical, Post-menopausal  Other Topics Concern   Not on file  Social History Narrative   Live with Insurance underwriter (middle)   Has 3 sons   Oldest lives in [place] lives in [place] (with 1 grandchild)   3 cats: lily-16 , troubles, whiskers (boys-5 years)      Enjoyed sewing, and knitting, but back injury made this worse. Read, tv, putt around, likes astrology      Diet: Eats what she wants. Enjoys veggies and fruits   Caffeine: 1-2 cups of coffee, iced tea   Water: 6-8 cups daily       Wear seatbelt   Does not wear sunscreen   Smoke and carbon monoxide detectors         Social Determinants of Health   Financial  Resource Strain: Medium Risk (08/29/2020)   Overall Financial Resource Strain (CARDIA)    Difficulty of Paying Living Expenses: Somewhat hard  Food Insecurity: No Food Insecurity (09/14/2021)   Hunger Vital Sign    Worried About Running Out of Food in the Last Year: Never true    Ran Out of Food in the Last Year: Never true  Transportation Needs: No Transportation Needs (09/14/2021)   PRAPARE - Hydrologist (Medical): No    Lack of Transportation (Non-Medical): No  Physical Activity: Inactive (09/14/2021)   Exercise Vital Sign    Days of Exercise per Week: 0 days    Minutes of Exercise per Session: 0 min  Stress: No Stress Concern Present (08/29/2020)   [place]    Feeling of  Stress : Not at all  Social Connections: Socially Isolated (08/29/2020)   Social Connection and Isolation Panel [NHANES]    Frequency of Communication with Friends and Family: Once a week    Frequency of Social Gatherings with Friends and Family: Once a week    Attends Religious Services: Never    Marine scientist or Organizations: No    Attends Archivist Meetings: Never    Marital Status: Widowed  Intimate Partner Violence: Not At Risk (08/29/2020)   Humiliation, Afraid, Rape, and Kick questionnaire    Fear of Current or Ex-Partner: No    Emotionally Abused: No    Physically Abused: No    Sexually Abused: No    Outpatient Medications Prior to Visit  Medication Sig Dispense Refill   amLODipine (NORVASC) 5 MG tablet Take 1 tablet (5 mg total) by mouth daily. 90 tablet 3   aspirin EC 81 MG tablet Take 81 mg by mouth daily. Swallow whole.     busPIRone (BUSPAR) 10 MG tablet Take 1 tablet (10 mg total) by mouth daily as needed (for mood). 180 tablet 1   carbamazepine (TEGRETOL) 200 MG tablet Take 1 tablet (200 mg total) by mouth in the morning and at bedtime. (Patient not taking: Reported on 10/22/2021) 60 tablet 6   diphenhydrAMINE (BENADRYL) 25 mg capsule Take 25 mg by mouth at bedtime as needed.     hydrochlorothiazide (HYDRODIURIL) 12.5 MG tablet Take 1 tablet (12.5 mg total) by mouth daily. 90 tablet 1   ibuprofen (ADVIL) 800 MG tablet Take 1 tablet (800 mg total) by mouth every 8 (eight) hours as needed. 30 tablet 1   levothyroxine (SYNTHROID) 25 MCG tablet Take 1 tablet (25 mcg total) by mouth daily before breakfast. 90 tablet 0   lisinopril (ZESTRIL) 40 MG tablet TAKE 1 TABLET(40 MG) BY MOUTH DAILY 90 tablet 1   Psyllium (METAMUCIL FIBER PO) Take 2 capsules by mouth 3 (three) times daily.     tiZANidine (ZANAFLEX) 2 MG tablet Take 1 tablet (2 mg total) by mouth every 8 (eight) hours as needed for muscle spasms. Do not take with alcohol or while driving or operating  heavy machinery.  May cause drowsiness. 30 tablet 0   triamcinolone cream (KENALOG) 0.1 % Apply 1 Application topically 2 (two) times daily. 15 g 0   No facility-administered medications prior to visit.    No Known Allergies  ROS Review of Systems  Constitutional:  Positive for fatigue. Negative for chills and fever.  HENT:  Negative for sinus pressure, sinus pain and sore throat.   Eyes:  Negative for pain and discharge.  Respiratory:  Negative  for cough and shortness of breath.   Cardiovascular:  Negative for chest pain and palpitations.  Gastrointestinal:  Negative for abdominal pain, constipation, diarrhea, nausea and vomiting.  Endocrine: Negative for polydipsia and polyuria.  Genitourinary:  Negative for dysuria and hematuria.  Musculoskeletal:  Positive for back pain and neck pain. Negative for neck stiffness.  Skin:  Negative for rash.  Neurological:  Negative for dizziness and weakness.       Twitching of left foot toes  Psychiatric/Behavioral:  Negative for agitation and behavioral problems.       Objective:    Physical Exam Vitals reviewed.  Constitutional:      General: She is not in acute distress.    Appearance: She is not diaphoretic.  HENT:     Head: Normocephalic and atraumatic.     Nose: No congestion.     Mouth/Throat:     Mouth: Mucous membranes are moist.     Pharynx: No posterior oropharyngeal erythema.  Eyes:     General: No scleral icterus.    Extraocular Movements: Extraocular movements intact.  Cardiovascular:     Rate and Rhythm: Normal rate and regular rhythm.     Pulses: Normal pulses.     Heart sounds: Normal heart sounds. No murmur heard. Pulmonary:     Breath sounds: Normal breath sounds. No wheezing or rales.  Musculoskeletal:        General: Tenderness (Right paraspinal (lumbar) area) present.     Cervical back: Neck supple. No tenderness.     Right lower leg: No edema.     Left lower leg: No edema.  Skin:    General: Skin is  warm.     Findings: No rash.  Neurological:     General: No focal deficit present.     Mental Status: She is alert and oriented to person, place, and time.     Sensory: No sensory deficit.     Motor: No weakness.  Psychiatric:        Mood and Affect: Mood normal.        Behavior: Behavior normal.     BP (!) 146/72 (BP Location: Right Arm)   Pulse 89   Ht 5' (1.524 m)   Wt 178 lb 3.2 oz (80.8 kg)   SpO2 97%   BMI 34.80 kg/m  Wt Readings from Last 3 Encounters:  04/07/22 178 lb 3.2 oz (80.8 kg)  02/25/22 173 lb 6.4 oz (78.7 kg)  10/22/21 183 lb 9.6 oz (83.3 kg)    Lab Results  Component Value Date   TSH 3.110 10/22/2021   Lab Results  Component Value Date   WBC 11.2 (H) 10/22/2021   HGB 13.7 10/22/2021   HCT 40.4 10/22/2021   MCV 93 10/22/2021   PLT 319 10/22/2021   Lab Results  Component Value Date   NA 143 10/22/2021   K 4.3 10/22/2021   CO2 20 10/22/2021   GLUCOSE 126 (H) 10/22/2021   BUN 9 10/22/2021   CREATININE 0.72 10/22/2021   BILITOT 0.3 10/22/2021   ALKPHOS 79 10/22/2021   AST 50 (H) 10/22/2021   ALT 43 (H) 10/22/2021   PROT 6.5 10/22/2021   ALBUMIN 4.1 10/22/2021   CALCIUM 9.6 10/22/2021   ANIONGAP 10 04/16/2020   EGFR 82 10/22/2021   Lab Results  Component Value Date   CHOL 167 10/01/2020   Lab Results  Component Value Date   HDL 45 10/01/2020   Lab Results  Component Value Date   LDLCALC  102 (H) 10/01/2020   Lab Results  Component Value Date   TRIG 108 10/01/2020   Lab Results  Component Value Date   CHOLHDL 3.7 10/01/2020   Lab Results  Component Value Date   HGBA1C 6.1 (H) 10/22/2021      Assessment & Plan:   Problem List Items Addressed This Visit       Cardiovascular and Mediastinum   Essential hypertension - Primary    BP Readings from Last 1 Encounters:  04/07/22 (!) 156/70  Uncontrolled Is supposed to be taking lisinopril, HCTZ and amlodipine Considering her well-controlled BP in the last visit and mild  dizziness, decreased dose of HCTZ to 12.5 mg QD Home medications reviewed - needs to start taking Amlodipine Counseled for compliance with the medications Advised DASH diet and moderate exercise/walking as tolerated        Endocrine   Hypothyroidism    Lab Results  Component Value Date   TSH 3.110 10/22/2021  Has chronic fatigue Check TSH and free T4 Had stopped NP thyroid, was started on Levothyroxine 25 mcg QD        Nervous and Auditory   Trigeminal neuralgia pain    Headache description likely suggestive of trigeminal neuralgia Takes Tegretol 200 mg QD for now Has been evaluated by neurology      Lumbar radiculopathy    Chronic low back pain with radiation to the LE Uncontrolled with Tramadol Prefers Ibuprofen as it worked better Advised to avoid daily oral NSAIDs Advised to wear back brace and/or apply heating pad as needed Tizanidine as needed for muscle spasms      Idiopathic peripheral neuropathy    She has numbness and twitching of the feet She also reports diffuse pain in arms and forearms Likely due to peripheral neuropathy Have advised her to discuss with neurologist, but she does not want to go to get [facility] - she is taking Tegretol for trigeminal neuralgia, which can also help with peripheral neuropathy        Other   GAD (generalized anxiety disorder)    Overall well-controlled with BuSpar 10 mg BID PRN      No orders of the defined types were placed in this encounter.   Follow-up: Return in about 3 months (around 07/08/2022) for HTN.    Lindell Spar, MD

## 2022-04-07 NOTE — Assessment & Plan Note (Signed)
Lab Results  Component Value Date   TSH 3.110 10/22/2021   Has chronic fatigue Check TSH and free T4 Had stopped NP thyroid, was started on Levothyroxine 25 mcg QD

## 2022-04-07 NOTE — Assessment & Plan Note (Signed)
Overall well-controlled with BuSpar 10 mg BID PRN

## 2022-04-07 NOTE — Assessment & Plan Note (Signed)
Chronic low back pain with radiation to the LE Uncontrolled with Tramadol Prefers Ibuprofen as it worked better Advised to avoid daily oral NSAIDs Advised to wear back brace and/or apply heating pad as needed Tizanidine as needed for muscle spasms

## 2022-04-07 NOTE — Patient Instructions (Signed)
Please take Lisinopril, HCTZ (12.5 mg) and Amlodipine for blood pressure.  Please take Tizanidine only as needed for back muscle spasms.  Please take Carbamazepine for facial pain.  Please take Buspirone for anxiety.

## 2022-04-09 NOTE — Telephone Encounter (Signed)
Patient son Nancy Schroeder dropped off more Cap program corrections Call when ready  Copied Noted sleeved

## 2022-04-11 IMAGING — DX DG CHEST 1V PORT
1 series · 1 of 1 positions shown · non-contrast
Comparison: None.

CLINICAL DATA: Chest pain, weakness and shortness of breath.

EXAM:
PORTABLE CHEST 1 VIEW

[chest ap]
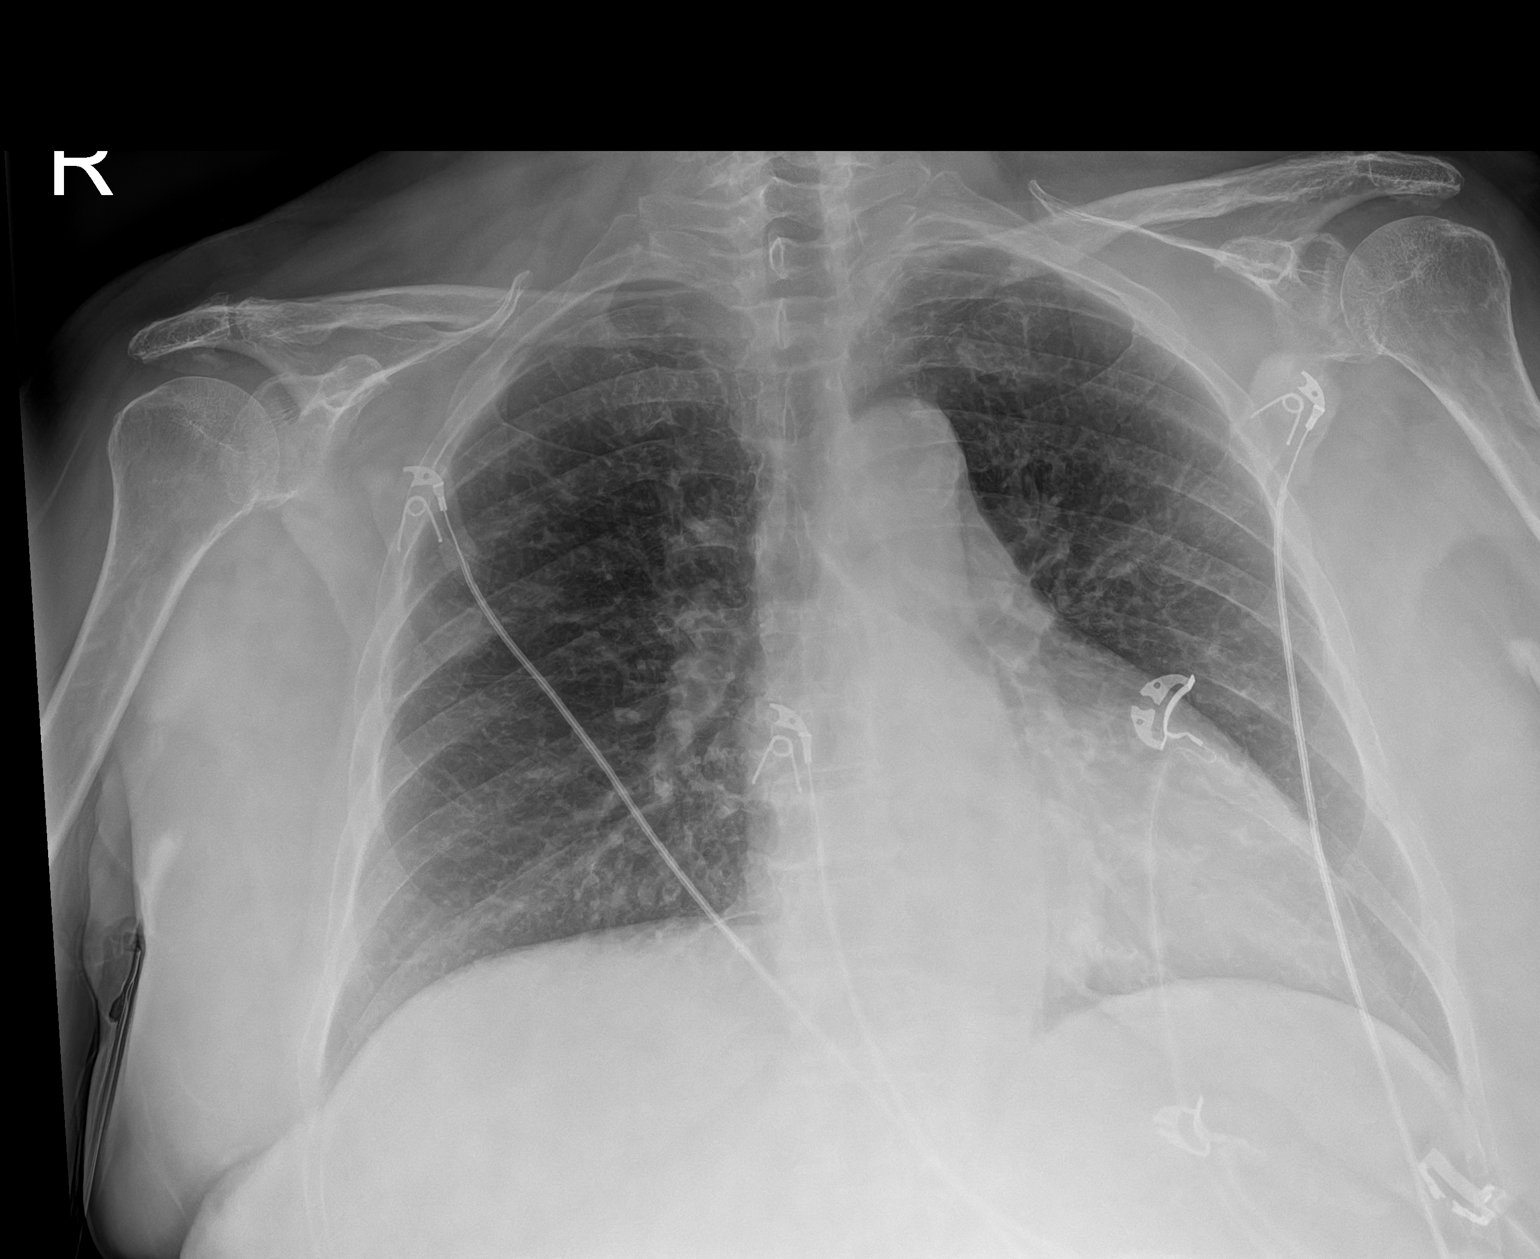

[1 of 1 positions shown; findings below may reference images not displayed]

FINDINGS: The lungs are clear. Heart size is normal. Aortic atherosclerosis.
No pneumothorax or pleural fluid. No acute or focal bony
abnormality.
IMPRESSION: No acute disease.

Aortic Atherosclerosis ([id number].O).

## 2022-04-13 DIAGNOSIS — Z0279 Encounter for issue of other medical certificate: Secondary | ICD-10-CM

## 2022-04-14 NOTE — Telephone Encounter (Signed)
Son came by the office and picked up forms for patient to sign.

## 2022-04-14 NOTE — Telephone Encounter (Signed)
Called patient forms are ready, needs patient to sign, will come by to sign

## 2022-04-14 NOTE — Telephone Encounter (Signed)
A copy of forms were made and sent to scans

## 2022-04-26 ENCOUNTER — Telehealth: Payer: Self-pay | Admitting: Neurology

## 2022-04-26 NOTE — Telephone Encounter (Signed)
Please have patient see her PCP for pain. She was evaluated for trigeminal neuralgia.   Thanks

## 2022-04-26 NOTE — Telephone Encounter (Signed)
Returned call to pt who stated that 2 days ago started having bilateral arm pain but its primarily on the right side which her her dominant side. pt stated she has no idea of what triggered the pain, not its almost unbearable and when she tried to peel potatoes she cannot do it or really doing any activity requiring hand and arm dexterity

## 2022-04-26 NOTE — Telephone Encounter (Signed)
Returned call to pt and made her aware to contact her pcp directly. She voiced gratitude and understanding.

## 2022-04-26 NOTE — Telephone Encounter (Signed)
Pt called and LVM stating that she is having a lot of pain in her arms to the point that she can not move them without being in pain. Please advise.

## 2022-05-19 ENCOUNTER — Encounter: Payer: Self-pay | Admitting: Family Medicine

## 2022-05-19 ENCOUNTER — Other Ambulatory Visit: Payer: Self-pay | Admitting: Family Medicine

## 2022-05-19 ENCOUNTER — Telehealth: Payer: Self-pay | Admitting: Internal Medicine

## 2022-05-19 ENCOUNTER — Ambulatory Visit (INDEPENDENT_AMBULATORY_CARE_PROVIDER_SITE_OTHER): Payer: Medicare HMO | Admitting: Family Medicine

## 2022-05-19 VITALS — BP 178/86 | HR 81 | Ht 60.0 in | Wt 177.1 lb

## 2022-05-19 DIAGNOSIS — I10 Essential (primary) hypertension: Secondary | ICD-10-CM | POA: Diagnosis not present

## 2022-05-19 DIAGNOSIS — M7918 Myalgia, other site: Secondary | ICD-10-CM

## 2022-05-19 MED ORDER — LIDOCAINE 5 % EX OINT
1.0000 | TOPICAL_OINTMENT | CUTANEOUS | 0 refills | Status: DC | PRN
Start: 2022-05-19 — End: 2022-05-19

## 2022-05-19 MED ORDER — LIDOCAINE-PRILOCAINE 2.5-2.5 % EX CREA: 1.0000 | TOPICAL_CREAM | CUTANEOUS | 0 refills | Status: DC | PRN

## 2022-05-19 MED ORDER — AMLODIPINE BESYLATE 5 MG PO TABS
5.0000 mg | ORAL_TABLET | Freq: Every day | ORAL | 3 refills | Status: DC
Start: 2022-05-19 — End: 2022-06-15

## 2022-05-19 MED ORDER — HYDROCHLOROTHIAZIDE 12.5 MG PO TABS
12.5000 mg | ORAL_TABLET | Freq: Every day | ORAL | 1 refills | Status: DC
Start: 2022-05-19 — End: 2022-06-16

## 2022-05-19 MED ORDER — TIZANIDINE HCL 2 MG PO TABS
2.0000 mg | ORAL_TABLET | Freq: Three times a day (TID) | ORAL | 0 refills | Status: DC | PRN
Start: 2022-05-19 — End: 2022-06-16

## 2022-05-19 MED ORDER — BLOOD PRESSURE KIT DEVI: 0 refills | Status: DC

## 2022-05-19 MED ORDER — LISINOPRIL 40 MG PO TABS
40.0000 mg | ORAL_TABLET | Freq: Every day | ORAL | 1 refills | Status: DC
Start: 2022-05-19 — End: 2023-06-08

## 2022-05-19 NOTE — Telephone Encounter (Addendum)
Insurance does not cover lidocaine (XYLOCAINE) 5 % ointment   But will cover LidocainePrilocaine    Also pt wants to change PCP to [facility] (seen today)

## 2022-05-19 NOTE — Assessment & Plan Note (Addendum)
Vitals:   05/19/22 1114 05/19/22 1116 05/19/22 1140  BP: (!) 202/77 (!) 189/75 (!) 178/86   Blood pressure not controlled in today's visit Patient reported only taking one blood pressure pill Lisinopril 40 mg I explained the importance of medication compliance and the need for taking her blood pressure medications daily. Explained to Continue taking Lisinopril 40 mg AND amlodipine 5, Hydrochlorothiazide 12.5 mg daily        Explained non pharmacological interventions such as low salt, DASH diet discussed. Educated on stress reduction and physical activity minimum 150 minutes per week. Discussed signs and symptoms of major cardiovascular event and need to present to the ED. Follow up in 4 weeks with at home blood pressure logs. Patient verbalizes understanding regarding plan of care and all questions answered.

## 2022-05-19 NOTE — Progress Notes (Signed)
Patient Office Visit   Subjective   Patient ID: Nancy Schroeder, female    DOB: [birth year]  Age: 89 y.o. MRN: [id number]  CC:  Chief Complaint  Patient presents with   Follow-up    HPI Nancy Schroeder 89 year old female, presents to the clinic for hypertension follow up. She  has a past medical history of Anxiety, Depression, Essential hypertension, and H/O back injury (2016).   Patient here for follow-up of elevated blood pressure. She is not exercising and is not adherent to low salt diet.  Blood pressure is not well controlled at home. Patient denies cardiac symptoms chest pain, chest pressure/discomfort, dyspnea, exertional chest pressure/discomfort, lower extremity edema, palpitations, syncope, and tachypnea. Cardiovascular risk factors: advanced age , dyslipidemia, hypertension, obesity (BMI >= 30 kg/m2), and sedentary lifestyle.       Outpatient Encounter Medications as of 05/19/2022  Medication Sig   aspirin EC 81 MG tablet Take 81 mg by mouth daily. Swallow whole.   Blood Pressure Monitoring (BLOOD PRESSURE KIT) DEVI Once a day   busPIRone (BUSPAR) 10 MG tablet Take 1 tablet (10 mg total) by mouth daily as needed (for mood).   carbamazepine (TEGRETOL) 200 MG tablet Take 1 tablet (200 mg total) by mouth in the morning and at bedtime.   diphenhydrAMINE (BENADRYL) 25 mg capsule Take 25 mg by mouth at bedtime as needed.   ibuprofen (ADVIL) 800 MG tablet Take 1 tablet (800 mg total) by mouth every 8 (eight) hours as needed.   levothyroxine (SYNTHROID) 25 MCG tablet Take 1 tablet (25 mcg total) by mouth daily before breakfast.   lidocaine (XYLOCAINE) 5 % ointment Apply 1 Application topically as needed.   Psyllium (METAMUCIL FIBER PO) Take 2 capsules by mouth 3 (three) times daily.   triamcinolone cream (KENALOG) 0.1 % Apply 1 Application topically 2 (two) times daily.   [DISCONTINUED] amLODipine (NORVASC) 5 MG tablet Take 1 tablet (5 mg total) by mouth daily.    [DISCONTINUED] hydrochlorothiazide (HYDRODIURIL) 12.5 MG tablet Take 1 tablet (12.5 mg total) by mouth daily.   [DISCONTINUED] lisinopril (ZESTRIL) 40 MG tablet TAKE 1 TABLET(40 MG) BY MOUTH DAILY   [DISCONTINUED] tiZANidine (ZANAFLEX) 2 MG tablet Take 1 tablet (2 mg total) by mouth every 8 (eight) hours as needed for muscle spasms. Do not take with alcohol or while driving or operating heavy machinery.  May cause drowsiness.   amLODipine (NORVASC) 5 MG tablet Take 1 tablet (5 mg total) by mouth daily. Hypertension   hydrochlorothiazide (HYDRODIURIL) 12.5 MG tablet Take 1 tablet (12.5 mg total) by mouth daily. Hypertension   lisinopril (ZESTRIL) 40 MG tablet Take 1 tablet (40 mg total) by mouth daily. Hypertension   tiZANidine (ZANAFLEX) 2 MG tablet Take 1 tablet (2 mg total) by mouth every 8 (eight) hours as needed for muscle spasms. Do not take with alcohol or while driving or operating heavy machinery.  May cause drowsiness.   No facility-administered encounter medications on file as of 05/19/2022.    Past Surgical History:  Procedure Laterality Date   ABDOMINAL HYSTERECTOMY     total   CATARACT EXTRACTION, BILATERAL  01/2020   CHOLECYSTECTOMY     NECK SURGERY      Review of Systems  Constitutional:  Negative for fever.  HENT:  Negative for hearing loss and tinnitus.   Respiratory:  Negative for shortness of breath.   Cardiovascular:  Negative for chest pain.  Gastrointestinal:  Negative for abdominal pain, nausea and vomiting.  Genitourinary:  Negative for dysuria and urgency.  Musculoskeletal:  Negative for myalgias.  Neurological:  Negative for dizziness and headaches.      Objective    BP (!) 178/86   Pulse 81   Ht 5' (1.524 m)   Wt 177 lb 1.3 oz (80.3 kg)   SpO2 97%   BMI 34.58 kg/m   Physical Exam Vitals reviewed.  Constitutional:      General: She is not in acute distress.    Appearance: Normal appearance. She is not ill-appearing, toxic-appearing or diaphoretic.   HENT:     Head: Normocephalic.  Eyes:     General:        Right eye: No discharge.        Left eye: No discharge.     Conjunctiva/sclera: Conjunctivae normal.  Cardiovascular:     Rate and Rhythm: Normal rate.     Pulses: Normal pulses.     Heart sounds: Normal heart sounds.  Pulmonary:     Effort: Pulmonary effort is normal. No respiratory distress.     Breath sounds: Normal breath sounds.  Musculoskeletal:        General: Normal range of motion.     Cervical back: Normal range of motion.  Skin:    General: Skin is warm and dry.     Capillary Refill: Capillary refill takes less than 2 seconds.  Neurological:     General: No focal deficit present.     Mental Status: She is alert and oriented to person, place, and time.     Coordination: Coordination abnormal.     Gait: Gait abnormal.  Psychiatric:        Mood and Affect: Mood normal.        Behavior: Behavior normal.       Assessment & Plan:  Essential hypertension Assessment & Plan: Vitals:   05/19/22 1114 05/19/22 1116 05/19/22 1140  BP: (!) 202/77 (!) 189/75 (!) 178/86   Blood pressure not controlled in today's visit Patient reported only taking one blood pressure pill Lisinopril 40 mg I explained the importance of medication compliance and the need for taking her blood pressure medications daily. Explained to Continue taking Lisinopril 40 mg AND amlodipine 5, Hydrochlorothiazide 12.5 mg daily        Explained non pharmacological interventions such as low salt, DASH diet discussed. Educated on stress reduction and physical activity minimum 150 minutes per week. Discussed signs and symptoms of major cardiovascular event and need to present to the ED. Follow up in 4 weeks with at home blood pressure logs. Patient verbalizes understanding regarding plan of care and all questions answered.    Orders: -     amLODIPine Besylate; Take 1 tablet (5 mg total) by mouth daily. Hypertension  Dispense: 90 tablet; Refill: 3 -      hydroCHLOROthiazide; Take 1 tablet (12.5 mg total) by mouth daily. Hypertension  Dispense: 90 tablet; Refill: 1 -     Lisinopril; Take 1 tablet (40 mg total) by mouth daily. Hypertension  Dispense: 90 tablet; Refill: 1  Pain of muscle of lower leg -     Lidocaine; Apply 1 Application topically as needed.  Dispense: 50 g; Refill: 0 -     tiZANidine HCl; Take 1 tablet (2 mg total) by mouth every 8 (eight) hours as needed for muscle spasms. Do not take with alcohol or while driving or operating heavy machinery.  May cause drowsiness.  Dispense: 30 tablet; Refill: 0  Other orders -  Blood Pressure Kit; Once a day  Dispense: 1 each; Refill: 0    Return in about 4 weeks (around 06/16/2022) for hypertension, re-check blood pressure.   Cruzita Lederer Newman Nip, FNP

## 2022-05-19 NOTE — Patient Instructions (Signed)
It was pleasure meeting with you today. Please take medications as prescribed. Follow up with your primary health provider if any health concerns arises. If symptoms worsen please contact your primary care provider and/or visit the emergency department.  

## 2022-05-21 ENCOUNTER — Telehealth: Payer: Self-pay | Admitting: Internal Medicine

## 2022-05-21 NOTE — Telephone Encounter (Signed)
Called patient left voicemail to contact our office need signature on CAP forms  Once signed by caregiver, make a copy and send to scans.

## 2022-05-24 ENCOUNTER — Other Ambulatory Visit: Payer: Self-pay | Admitting: Internal Medicine

## 2022-05-24 DIAGNOSIS — M5416 Radiculopathy, lumbar region: Secondary | ICD-10-CM

## 2022-05-24 DIAGNOSIS — F341 Dysthymic disorder: Secondary | ICD-10-CM

## 2022-06-09 ENCOUNTER — Telehealth: Payer: Self-pay | Admitting: Internal Medicine

## 2022-06-09 ENCOUNTER — Other Ambulatory Visit: Payer: Self-pay | Admitting: Family Medicine

## 2022-06-09 ENCOUNTER — Ambulatory Visit (HOSPITAL_COMMUNITY)
Admission: RE | Admit: 2022-06-09 | Discharge: 2022-06-09 | Disposition: A | Discharge status: Home / Self Care | Payer: Medicare HMO | Source: Ambulatory Visit | Attending: Family Medicine | Admitting: Family Medicine

## 2022-06-09 ENCOUNTER — Encounter: Payer: Self-pay | Admitting: Family Medicine

## 2022-06-09 ENCOUNTER — Ambulatory Visit (INDEPENDENT_AMBULATORY_CARE_PROVIDER_SITE_OTHER): Payer: Medicare HMO | Admitting: Family Medicine

## 2022-06-09 VITALS — BP 132/88 | HR 80 | Ht 60.0 in | Wt 176.0 lb

## 2022-06-09 DIAGNOSIS — F32A Depression, unspecified: Secondary | ICD-10-CM | POA: Diagnosis present

## 2022-06-09 DIAGNOSIS — Y93E1 Activity, personal bathing and showering: Secondary | ICD-10-CM | POA: Diagnosis not present

## 2022-06-09 DIAGNOSIS — Z79890 Hormone replacement therapy (postmenopausal): Secondary | ICD-10-CM | POA: Diagnosis not present

## 2022-06-09 DIAGNOSIS — Z90710 Acquired absence of both cervix and uterus: Secondary | ICD-10-CM | POA: Diagnosis not present

## 2022-06-09 DIAGNOSIS — S92511A Displaced fracture of proximal phalanx of right lesser toe(s), initial encounter for closed fracture: Secondary | ICD-10-CM | POA: Diagnosis not present

## 2022-06-09 DIAGNOSIS — M47816 Spondylosis without myelopathy or radiculopathy, lumbar region: Secondary | ICD-10-CM | POA: Diagnosis present

## 2022-06-09 DIAGNOSIS — I10 Essential (primary) hypertension: Secondary | ICD-10-CM | POA: Diagnosis present

## 2022-06-09 DIAGNOSIS — G8929 Other chronic pain: Secondary | ICD-10-CM

## 2022-06-09 DIAGNOSIS — W182XXA Fall in (into) shower or empty bathtub, initial encounter: Secondary | ICD-10-CM | POA: Diagnosis present

## 2022-06-09 DIAGNOSIS — F419 Anxiety disorder, unspecified: Secondary | ICD-10-CM | POA: Diagnosis present

## 2022-06-09 DIAGNOSIS — R531 Weakness: Secondary | ICD-10-CM | POA: Diagnosis present

## 2022-06-09 DIAGNOSIS — G9389 Other specified disorders of brain: Secondary | ICD-10-CM | POA: Diagnosis not present

## 2022-06-09 DIAGNOSIS — F341 Dysthymic disorder: Secondary | ICD-10-CM | POA: Diagnosis not present

## 2022-06-09 DIAGNOSIS — M545 Low back pain, unspecified: Secondary | ICD-10-CM

## 2022-06-09 DIAGNOSIS — Z7982 Long term (current) use of aspirin: Secondary | ICD-10-CM | POA: Diagnosis not present

## 2022-06-09 DIAGNOSIS — R102 Pelvic and perineal pain: Secondary | ICD-10-CM | POA: Diagnosis not present

## 2022-06-09 DIAGNOSIS — Z79899 Other long term (current) drug therapy: Secondary | ICD-10-CM | POA: Diagnosis not present

## 2022-06-09 DIAGNOSIS — B952 Enterococcus as the cause of diseases classified elsewhere: Secondary | ICD-10-CM | POA: Diagnosis present

## 2022-06-09 DIAGNOSIS — I160 Hypertensive urgency: Secondary | ICD-10-CM | POA: Diagnosis present

## 2022-06-09 DIAGNOSIS — N390 Urinary tract infection, site not specified: Secondary | ICD-10-CM | POA: Diagnosis not present

## 2022-06-09 DIAGNOSIS — E039 Hypothyroidism, unspecified: Secondary | ICD-10-CM | POA: Diagnosis present

## 2022-06-09 DIAGNOSIS — M7918 Myalgia, other site: Secondary | ICD-10-CM | POA: Diagnosis not present

## 2022-06-09 DIAGNOSIS — M5441 Lumbago with sciatica, right side: Secondary | ICD-10-CM | POA: Diagnosis present

## 2022-06-09 DIAGNOSIS — Z9049 Acquired absence of other specified parts of digestive tract: Secondary | ICD-10-CM | POA: Diagnosis not present

## 2022-06-09 DIAGNOSIS — Z66 Do not resuscitate: Secondary | ICD-10-CM | POA: Diagnosis present

## 2022-06-09 DIAGNOSIS — M5440 Lumbago with sciatica, unspecified side: Secondary | ICD-10-CM | POA: Diagnosis not present

## 2022-06-09 DIAGNOSIS — G894 Chronic pain syndrome: Secondary | ICD-10-CM

## 2022-06-09 DIAGNOSIS — R296 Repeated falls: Secondary | ICD-10-CM | POA: Diagnosis not present

## 2022-06-09 DIAGNOSIS — N3000 Acute cystitis without hematuria: Secondary | ICD-10-CM | POA: Diagnosis present

## 2022-06-09 DIAGNOSIS — Z8673 Personal history of transient ischemic attack (TIA), and cerebral infarction without residual deficits: Secondary | ICD-10-CM | POA: Diagnosis not present

## 2022-06-09 DIAGNOSIS — M5442 Lumbago with sciatica, left side: Secondary | ICD-10-CM | POA: Diagnosis present

## 2022-06-09 DIAGNOSIS — S92591A Other fracture of right lesser toe(s), initial encounter for closed fracture: Secondary | ICD-10-CM | POA: Diagnosis present

## 2022-06-09 DIAGNOSIS — M79671 Pain in right foot: Secondary | ICD-10-CM | POA: Diagnosis present

## 2022-06-09 MED ORDER — DULOXETINE HCL 20 MG PO CPEP
20.0000 mg | ORAL_CAPSULE | Freq: Every day | ORAL | 3 refills | Status: DC
Start: 2022-06-09 — End: 2022-06-30

## 2022-06-09 NOTE — Patient Instructions (Signed)
        Great to see you today.   - Please take medications as prescribed. - Follow up with your primary health provider if any health concerns arises. - If symptoms worsen please contact your primary care provider and/or visit the emergency department.  

## 2022-06-09 NOTE — Progress Notes (Signed)
Patient Office Visit   Subjective   Patient ID: Nancy Schroeder, female    DOB: September 19, 1933  Age: 89 y.o. MRN: [id number]  CC:  Chief Complaint  Patient presents with   Leg Pain    Patient complains of bilateral whole leg,pain, especially in knees, and lower back pain starting two days ago. And feeling lightheaded since waking up this morning.     HPI Nancy Schroeder 89 year old female, presents to the clinic for chronic pain all over her body. She  has a past medical history of Anxiety, Depression, Essential hypertension, and H/O back injury (2016).  Chronic Knee Pain  There was no injury mechanism. The pain is present in the left knee and right knee. The quality of the pain is described as aching and burning. The pain is at a severity of 10/10. The pain has been constant since onset. Associated symptoms include muscle weakness. Pertinent negatives include no numbness or tingling. The symptoms are aggravated by movement. She has tried NSAIDs for the symptoms, The treatment provided no relief.   Chronic Back Pain Patient reports lower back pain occurs constantly and has has been gradually worsening since onset. The pain is present in the lumbar spine and sacro-iliac. The quality of the pain is described as aching. The pain is at a severity of 10/10. The pain is The same all the time. The symptoms are aggravated by lying down, position and sitting. Pertinent negatives include no bladder incontinence, bowel incontinence, numbness, paresis, paresthesias or tingling. Risk factors include obesity and sedentary lifestyle. She has tried NSAIDs for the symptoms. The treatment provided mild relief.        Outpatient Encounter Medications as of 06/09/2022  Medication Sig   amLODipine (NORVASC) 5 MG tablet Take 1 tablet (5 mg total) by mouth daily. Hypertension   aspirin EC 81 MG tablet Take 81 mg by mouth daily. Swallow whole.   Blood Pressure Monitoring (BLOOD PRESSURE KIT) DEVI Once a day    busPIRone (BUSPAR) 10 MG tablet TAKE 1 TABLET BY MOUTH DAILY AS NEEDED   diphenhydrAMINE (BENADRYL) 25 mg capsule Take 25 mg by mouth at bedtime as needed.   DULoxetine (CYMBALTA) 20 MG capsule Take 1 capsule (20 mg total) by mouth daily.   hydrochlorothiazide (HYDRODIURIL) 12.5 MG tablet Take 1 tablet (12.5 mg total) by mouth daily. Hypertension   ibuprofen (ADVIL) 800 MG tablet TAKE 1 TABLET(800 MG) BY MOUTH EVERY 8 HOURS AS NEEDED   levothyroxine (SYNTHROID) 25 MCG tablet Take 1 tablet (25 mcg total) by mouth daily before breakfast.   lidocaine-prilocaine (EMLA) cream Apply 1 Application topically as needed.   lisinopril (ZESTRIL) 40 MG tablet Take 1 tablet (40 mg total) by mouth daily. Hypertension   Psyllium (METAMUCIL FIBER PO) Take 2 capsules by mouth 3 (three) times daily.   tiZANidine (ZANAFLEX) 2 MG tablet Take 1 tablet (2 mg total) by mouth every 8 (eight) hours as needed for muscle spasms. Do not take with alcohol or while driving or operating heavy machinery.  May cause drowsiness.   triamcinolone cream (KENALOG) 0.1 % Apply 1 Application topically 2 (two) times daily.   carbamazepine (TEGRETOL) 200 MG tablet Take 1 tablet (200 mg total) by mouth in the morning and at bedtime.   No facility-administered encounter medications on file as of 06/09/2022.    Past Surgical History:  Procedure Laterality Date   ABDOMINAL HYSTERECTOMY     total   CATARACT EXTRACTION, BILATERAL  01/2020   CHOLECYSTECTOMY  NECK SURGERY      Review of Systems  Constitutional:  Negative for chills and fever.  HENT:  Negative for tinnitus.   Respiratory:  Negative for shortness of breath.   Cardiovascular:  Negative for chest pain.  Gastrointestinal:  Negative for abdominal pain.  Genitourinary:  Negative for dysuria.  Musculoskeletal:  Positive for back pain, joint pain and myalgias. Negative for falls.  Neurological:  Negative for dizziness and headaches.      Objective    BP 132/88    Pulse 80   Ht 5' (1.524 m)   Wt 176 lb (79.8 kg)   SpO2 95%   BMI 34.37 kg/m   Physical Exam Vitals reviewed.  Constitutional:      General: She is not in acute distress.    Appearance: Normal appearance. She is not ill-appearing, toxic-appearing or diaphoretic.  HENT:     Head: Normocephalic.  Eyes:     General:        Right eye: No discharge.        Left eye: No discharge.     Conjunctiva/sclera: Conjunctivae normal.  Cardiovascular:     Rate and Rhythm: Normal rate.     Pulses: Normal pulses.     Heart sounds: Normal heart sounds.  Pulmonary:     Effort: Pulmonary effort is normal. No respiratory distress.     Breath sounds: Normal breath sounds.  Abdominal:     General: Bowel sounds are normal.     Palpations: Abdomen is soft.     Tenderness: There is no right CVA tenderness or left CVA tenderness.  Musculoskeletal:        General: No swelling, tenderness, deformity or signs of injury.     Cervical back: Normal range of motion.     Thoracic back: Decreased range of motion.     Lumbar back: No tenderness. Decreased range of motion. Negative right straight leg raise test and negative left straight leg raise test.     Right knee: No swelling or erythema. Normal range of motion. Normal pulse.     Left knee: No swelling or erythema. Normal range of motion. Normal pulse.     Right lower leg: No edema.     Left lower leg: No edema.  Skin:    General: Skin is warm and dry.     Capillary Refill: Capillary refill takes less than 2 seconds.  Neurological:     General: No focal deficit present.     Mental Status: She is alert and oriented to person, place, and time.     Coordination: Coordination abnormal.     Gait: Gait abnormal.  Psychiatric:        Mood and Affect: Mood normal.        Behavior: Behavior normal.        Thought Content: Thought content normal.       Assessment & Plan:  Chronic bilateral low back pain, unspecified whether sciatica present -     DG  Lumbar Spine 2-3 Views; Future -     DULoxetine HCl; Take 1 capsule (20 mg total) by mouth daily.  Dispense: 30 capsule; Refill: 3  Chronic pain syndrome Assessment & Plan: Lumbar X ray ordered - Awaiting result will follow up. Trial on Cymbalta 20 mg daily, Possible physical therapy referral. Explained to patient Non pharmacological interventions include the use of ice or heat, rest, recommend range of motion exercises, gentle stretching. Follow up for worsening or persistent symptoms. Patient verbalizes understanding  regarding plan of care and all questions answered.      Return if symptoms worsen or fail to improve.   Cruzita Lederer Newman Nip, FNP

## 2022-06-09 NOTE — Assessment & Plan Note (Signed)
Lumbar X ray ordered - Awaiting result will follow up. Trial on Cymbalta 20 mg daily, Possible physical therapy referral. Explained to patient Non pharmacological interventions include the use of ice or heat, rest, recommend range of motion exercises, gentle stretching. Follow up for worsening or persistent symptoms. Patient verbalizes understanding regarding plan of care and all questions answered.

## 2022-06-09 NOTE — Telephone Encounter (Signed)
Patient stated she Dr Allena Katz is a wonderful doctor and doesn't have anything negative to say about him. She had Dr Allena Katz fill out a form to get her medical help at home and the [facility] told her she needed to switch Drs due to the form being filled out incorrectly.

## 2022-06-09 NOTE — Telephone Encounter (Signed)
Patient wants to change pcp to [facility].

## 2022-06-11 NOTE — Telephone Encounter (Signed)
Provider/patient care not compromised.  Provider/CMA should correct patient's paperwork to meet expectations.

## 2022-06-12 ENCOUNTER — Emergency Department (HOSPITAL_COMMUNITY): Payer: Medicare HMO

## 2022-06-12 ENCOUNTER — Other Ambulatory Visit: Payer: Self-pay

## 2022-06-12 ENCOUNTER — Inpatient Hospital Stay (HOSPITAL_COMMUNITY)
Admission: EM | Admit: 2022-06-12 | Discharge: 2022-06-16 | DRG: 552 | Disposition: A | Discharge status: Home Health | Payer: Medicare HMO | Attending: Internal Medicine | Admitting: Internal Medicine

## 2022-06-12 ENCOUNTER — Encounter (HOSPITAL_COMMUNITY): Payer: Self-pay

## 2022-06-12 DIAGNOSIS — M5440 Lumbago with sciatica, unspecified side: Secondary | ICD-10-CM

## 2022-06-12 DIAGNOSIS — Y93E1 Activity, personal bathing and showering: Secondary | ICD-10-CM | POA: Diagnosis not present

## 2022-06-12 DIAGNOSIS — R531 Weakness: Secondary | ICD-10-CM | POA: Diagnosis present

## 2022-06-12 DIAGNOSIS — Z79899 Other long term (current) drug therapy: Secondary | ICD-10-CM | POA: Diagnosis not present

## 2022-06-12 DIAGNOSIS — Z66 Do not resuscitate: Secondary | ICD-10-CM | POA: Diagnosis present

## 2022-06-12 DIAGNOSIS — M47816 Spondylosis without myelopathy or radiculopathy, lumbar region: Principal | ICD-10-CM | POA: Diagnosis present

## 2022-06-12 DIAGNOSIS — M5441 Lumbago with sciatica, right side: Secondary | ICD-10-CM

## 2022-06-12 DIAGNOSIS — Z7982 Long term (current) use of aspirin: Secondary | ICD-10-CM

## 2022-06-12 DIAGNOSIS — G8929 Other chronic pain: Secondary | ICD-10-CM | POA: Diagnosis present

## 2022-06-12 DIAGNOSIS — W182XXA Fall in (into) shower or empty bathtub, initial encounter: Secondary | ICD-10-CM | POA: Diagnosis present

## 2022-06-12 DIAGNOSIS — Z8673 Personal history of transient ischemic attack (TIA), and cerebral infarction without residual deficits: Secondary | ICD-10-CM | POA: Diagnosis not present

## 2022-06-12 DIAGNOSIS — B952 Enterococcus as the cause of diseases classified elsewhere: Secondary | ICD-10-CM | POA: Diagnosis present

## 2022-06-12 DIAGNOSIS — I10 Essential (primary) hypertension: Secondary | ICD-10-CM | POA: Diagnosis present

## 2022-06-12 DIAGNOSIS — Z90710 Acquired absence of both cervix and uterus: Secondary | ICD-10-CM

## 2022-06-12 DIAGNOSIS — F419 Anxiety disorder, unspecified: Secondary | ICD-10-CM | POA: Diagnosis present

## 2022-06-12 DIAGNOSIS — N3000 Acute cystitis without hematuria: Secondary | ICD-10-CM | POA: Diagnosis present

## 2022-06-12 DIAGNOSIS — M545 Low back pain, unspecified: Secondary | ICD-10-CM

## 2022-06-12 DIAGNOSIS — S92591A Other fracture of right lesser toe(s), initial encounter for closed fracture: Secondary | ICD-10-CM | POA: Diagnosis present

## 2022-06-12 DIAGNOSIS — N390 Urinary tract infection, site not specified: Secondary | ICD-10-CM

## 2022-06-12 DIAGNOSIS — M5442 Lumbago with sciatica, left side: Secondary | ICD-10-CM | POA: Diagnosis present

## 2022-06-12 DIAGNOSIS — I160 Hypertensive urgency: Secondary | ICD-10-CM | POA: Diagnosis present

## 2022-06-12 DIAGNOSIS — M7918 Myalgia, other site: Secondary | ICD-10-CM

## 2022-06-12 DIAGNOSIS — E039 Hypothyroidism, unspecified: Secondary | ICD-10-CM | POA: Diagnosis present

## 2022-06-12 DIAGNOSIS — Z79890 Hormone replacement therapy (postmenopausal): Secondary | ICD-10-CM

## 2022-06-12 DIAGNOSIS — M79671 Pain in right foot: Secondary | ICD-10-CM | POA: Diagnosis present

## 2022-06-12 DIAGNOSIS — S92511A Displaced fracture of proximal phalanx of right lesser toe(s), initial encounter for closed fracture: Secondary | ICD-10-CM

## 2022-06-12 DIAGNOSIS — F32A Depression, unspecified: Secondary | ICD-10-CM | POA: Diagnosis present

## 2022-06-12 DIAGNOSIS — Z9049 Acquired absence of other specified parts of digestive tract: Secondary | ICD-10-CM

## 2022-06-12 DIAGNOSIS — F341 Dysthymic disorder: Secondary | ICD-10-CM

## 2022-06-12 DIAGNOSIS — R296 Repeated falls: Secondary | ICD-10-CM

## 2022-06-12 LAB — BASIC METABOLIC PANEL
Anion gap: 14 (ref 5–15)
BUN: 18 mg/dL (ref 8–23)
CO2: 22 mmol/L (ref 22–32)
Calcium: 9.5 mg/dL (ref 8.9–10.3)
Chloride: 104 mmol/L (ref 98–111)
Creatinine, Ser: 0.72 mg/dL (ref 0.44–1.00)
GFR, Estimated: >60 mL/min (ref >60)
Glucose, Bld: 123 mg/dL — ABNORMAL HIGH (ref 70–99)
Potassium: 4.1 mmol/L (ref 3.5–5.1)
Sodium: 140 mmol/L (ref 135–145)

## 2022-06-12 LAB — CBC
HCT: 44.2 % (ref 36.0–46.0)
Hemoglobin: 14.3 g/dL (ref 12.0–15.0)
MCH: 30.6 pg (ref 26.0–34.0)
MCHC: 32.4 g/dL (ref 30.0–36.0)
MCV: 94.4 fL (ref 80.0–100.0)
Platelets: 372 10*3/uL (ref 150–400)
RBC: 4.68 MIL/uL (ref 3.87–5.11)
RDW: 12.6 % (ref 11.5–15.5)
WBC: 10.3 10*3/uL (ref 4.0–10.5)
nRBC: 0 % (ref 0.0–0.2)

## 2022-06-12 LAB — URINALYSIS, ROUTINE W REFLEX MICROSCOPIC
Bilirubin Urine: NEGATIVE
Glucose, UA: NEGATIVE mg/dL
Hgb urine dipstick: NEGATIVE
Ketones, ur: 5 mg/dL — AB
Nitrite: NEGATIVE
Protein, ur: NEGATIVE mg/dL
Specific Gravity, Urine: 1.018 (ref 1.005–1.030)
WBC, UA: >50 WBC/hpf (ref 0–5)
pH: 6 (ref 5.0–8.0)

## 2022-06-12 LAB — MAGNESIUM: Magnesium: 2.1 mg/dL (ref 1.7–2.4)

## 2022-06-12 LAB — CK: Total CK: 71 U/L (ref 38–234)

## 2022-06-12 MED ORDER — CARBAMAZEPINE 200 MG PO TABS
200.0000 mg | ORAL_TABLET | Freq: Two times a day (BID) | ORAL | Status: DC
  Administered 2022-06-12 – 2022-06-16 (×7): 200 mg via ORAL
  Filled 2022-06-12 (×9): qty 1

## 2022-06-12 MED ORDER — ACETAMINOPHEN 650 MG RE SUPP: 650.0000 mg | Freq: Four times a day (QID) | RECTAL | Status: DC | PRN

## 2022-06-12 MED ORDER — PSYLLIUM 95 % PO PACK
2.0000 | PACK | Freq: Two times a day (BID) | ORAL | Status: DC
  Administered 2022-06-13 – 2022-06-16 (×4): 2 via ORAL
  Filled 2022-06-12 (×9): qty 2

## 2022-06-12 MED ORDER — SODIUM CHLORIDE 0.9 % IV SOLN: INTRAVENOUS | Status: DC

## 2022-06-12 MED ORDER — LISINOPRIL 20 MG PO TABS
40.0000 mg | ORAL_TABLET | Freq: Every day | ORAL | Status: DC
  Administered 2022-06-12 – 2022-06-16 (×5): 40 mg via ORAL
  Filled 2022-06-12 (×5): qty 2

## 2022-06-12 MED ORDER — DIPHENHYDRAMINE HCL 25 MG PO CAPS: 25.0000 mg | ORAL_CAPSULE | Freq: Every evening | ORAL | Status: DC | PRN

## 2022-06-12 MED ORDER — MAGNESIUM HYDROXIDE 400 MG/5ML PO SUSP: 30.0000 mL | Freq: Every day | ORAL | Status: DC | PRN

## 2022-06-12 MED ORDER — SODIUM CHLORIDE 0.9 % IV SOLN
1.0000 g | Freq: Once | INTRAVENOUS | Status: AC
  Administered 2022-06-12: 1 g via INTRAVENOUS
  Filled 2022-06-12: qty 10

## 2022-06-12 MED ORDER — ASPIRIN 81 MG PO TBEC
81.0000 mg | DELAYED_RELEASE_TABLET | Freq: Every day | ORAL | Status: DC
  Administered 2022-06-13 – 2022-06-16 (×4): 81 mg via ORAL
  Filled 2022-06-12 (×4): qty 1

## 2022-06-12 MED ORDER — LABETALOL HCL 5 MG/ML IV SOLN
20.0000 mg | INTRAVENOUS | Status: DC | PRN
  Administered 2022-06-13 – 2022-06-14 (×2): 20 mg via INTRAVENOUS
  Filled 2022-06-12 (×2): qty 4

## 2022-06-12 MED ORDER — ONDANSETRON HCL 4 MG/2ML IJ SOLN: 4.0000 mg | Freq: Four times a day (QID) | INTRAMUSCULAR | Status: DC | PRN

## 2022-06-12 MED ORDER — ACETAMINOPHEN 325 MG PO TABS
650.0000 mg | ORAL_TABLET | Freq: Four times a day (QID) | ORAL | Status: DC | PRN
  Administered 2022-06-13 – 2022-06-15 (×3): 650 mg via ORAL
  Filled 2022-06-12 (×3): qty 2

## 2022-06-12 MED ORDER — BUSPIRONE HCL 10 MG PO TABS: 10.0000 mg | ORAL_TABLET | Freq: Every day | ORAL | Status: DC | PRN

## 2022-06-12 MED ORDER — ONDANSETRON HCL 4 MG PO TABS: 4.0000 mg | ORAL_TABLET | Freq: Four times a day (QID) | ORAL | Status: DC | PRN

## 2022-06-12 MED ORDER — AMLODIPINE BESYLATE 5 MG PO TABS
5.0000 mg | ORAL_TABLET | Freq: Once | ORAL | Status: AC
  Administered 2022-06-12: 5 mg via ORAL
  Filled 2022-06-12: qty 1

## 2022-06-12 MED ORDER — SODIUM CHLORIDE 0.9 % IV SOLN
1.0000 g | INTRAVENOUS | Status: DC
  Administered 2022-06-13 – 2022-06-15 (×3): 1 g via INTRAVENOUS
  Filled 2022-06-12 (×3): qty 10

## 2022-06-12 MED ORDER — MORPHINE SULFATE (PF) 2 MG/ML IV SOLN
2.0000 mg | Freq: Once | INTRAVENOUS | Status: AC
  Administered 2022-06-12: 2 mg via INTRAVENOUS
  Filled 2022-06-12: qty 1

## 2022-06-12 MED ORDER — GADOBUTROL 1 MMOL/ML IV SOLN
8.0000 mL | Freq: Once | INTRAVENOUS | Status: AC | PRN
  Administered 2022-06-12: 8 mL via INTRAVENOUS

## 2022-06-12 MED ORDER — TIZANIDINE HCL 4 MG PO TABS
2.0000 mg | ORAL_TABLET | Freq: Three times a day (TID) | ORAL | Status: DC | PRN
  Administered 2022-06-12 – 2022-06-13 (×2): 2 mg via ORAL
  Filled 2022-06-12 (×2): qty 1

## 2022-06-12 MED ORDER — AMLODIPINE BESYLATE 5 MG PO TABS
5.0000 mg | ORAL_TABLET | Freq: Every day | ORAL | Status: DC
  Administered 2022-06-13 – 2022-06-14 (×2): 5 mg via ORAL
  Filled 2022-06-12 (×2): qty 1

## 2022-06-12 MED ORDER — DULOXETINE HCL 20 MG PO CPEP
20.0000 mg | ORAL_CAPSULE | Freq: Every day | ORAL | Status: DC
  Administered 2022-06-14 – 2022-06-16 (×3): 20 mg via ORAL
  Filled 2022-06-12 (×4): qty 1

## 2022-06-12 MED ORDER — HYDROCHLOROTHIAZIDE 12.5 MG PO TABS
12.5000 mg | ORAL_TABLET | Freq: Every day | ORAL | Status: DC
  Administered 2022-06-13 – 2022-06-15 (×3): 12.5 mg via ORAL
  Filled 2022-06-12 (×3): qty 1

## 2022-06-12 MED ORDER — LISINOPRIL 20 MG PO TABS
40.0000 mg | ORAL_TABLET | Freq: Once | ORAL | Status: AC
  Administered 2022-06-12: 40 mg via ORAL
  Filled 2022-06-12: qty 2

## 2022-06-12 MED ORDER — ENOXAPARIN SODIUM 40 MG/0.4ML IJ SOSY
40.0000 mg | PREFILLED_SYRINGE | INTRAMUSCULAR | Status: DC
  Administered 2022-06-12 – 2022-06-15 (×4): 40 mg via SUBCUTANEOUS
  Filled 2022-06-12 (×4): qty 0.4

## 2022-06-12 MED ORDER — TRAZODONE HCL 50 MG PO TABS
25.0000 mg | ORAL_TABLET | Freq: Every evening | ORAL | Status: DC | PRN
  Administered 2022-06-14 – 2022-06-15 (×2): 25 mg via ORAL
  Filled 2022-06-12 (×2): qty 1

## 2022-06-12 MED ORDER — LEVOTHYROXINE SODIUM 25 MCG PO TABS
25.0000 ug | ORAL_TABLET | Freq: Every day | ORAL | Status: DC
  Administered 2022-06-13 – 2022-06-16 (×4): 25 ug via ORAL
  Filled 2022-06-12 (×4): qty 1

## 2022-06-12 MED ORDER — HYDROCHLOROTHIAZIDE 12.5 MG PO TABS
12.5000 mg | ORAL_TABLET | Freq: Every day | ORAL | Status: DC
  Administered 2022-06-12: 12.5 mg via ORAL
  Filled 2022-06-12: qty 1

## 2022-06-12 NOTE — ED Notes (Signed)
Patient transported to MRI 

## 2022-06-12 NOTE — ED Notes (Signed)
ED TO INPATIENT HANDOFF REPORT  ED Nurse Name and Phone #: Jeancarlos Marchena, RN [phone]  S Name/Age/Gender Nancy Schroeder 89 y.o. female Room/Bed: 011C/011C  Code Status   Code Status: Not on file  Home/SNF/Other Home Patient oriented to: self, place, time, and situation Is this baseline? Yes   Triage Complete: Triage complete  Chief Complaint Recurrent falls [R29.6]  Triage Note Patient reports having difficulty walking and needing to hold on to things.  Reports she has had multiple falls and unable to get up.  Reports bilateral leg pain.  Patient has known chronic left leg pain.  Patient reports she had incontinence when she fell this morning.  No neuro deficits noted LVO-  No unilateral weakness, slurred speech or neglect.    Allergies No Known Allergies  Level of Care/Admitting Diagnosis ED Disposition     ED Disposition  Admit   Condition  --   Comment  Hospital Area: [facility] [[id number]]  Level of Care: Med-Surg [16]  May admit patient to [facility] or [facility] if equivalent level of care is available:: No  Covid Evaluation: Asymptomatic - no recent exposure (last 10 days) testing not required  Diagnosis: Recurrent falls [725973]  Admitting Physician: Hannah Beat [[id number]]  Attending Physician: Hannah Beat [[id number]]  Certification:: I certify this patient will need inpatient services for at least 2 midnights  Estimated Length of Stay: 2          B Medical/Surgery History Past Medical History:  Diagnosis Date   Anxiety    Depression    Essential hypertension    H/O back injury 2016   Past Surgical History:  Procedure Laterality Date   ABDOMINAL HYSTERECTOMY     total   CATARACT EXTRACTION, BILATERAL  01/2020   CHOLECYSTECTOMY     NECK SURGERY       A IV Location/Drains/Wounds Patient Lines/Drains/Airways Status     Active Line/Drains/Airways     Name Placement date Placement time Site Days   Peripheral IV 06/12/22 20 G Left  Antecubital 06/12/22  1414  Antecubital  less than 1            Intake/Output Last 24 hours No intake or output data in the 24 hours ending 06/12/22 2024  Labs/Imaging Results for orders placed or performed during the hospital encounter of 06/12/22 (from the past 48 hour(s))  Basic metabolic panel     Status: Abnormal   Collection Time: 06/12/22 11:35 AM  Result Value Ref Range   Sodium 140 135 - 145 mmol/L   Potassium 4.1 3.5 - 5.1 mmol/L   Chloride 104 98 - 111 mmol/L   CO2 22 22 - 32 mmol/L   Glucose, Bld 123 (H) 70 - 99 mg/dL    Comment: Glucose reference range applies only to samples taken after fasting for at least 8 hours.   BUN 18 8 - 23 mg/dL   Creatinine, Ser 4.40 0.44 - 1.00 mg/dL   Calcium 9.5 8.9 - 34.7 mg/dL   GFR, Estimated >42 >59 mL/min    Comment: (NOTE) Calculated using the CKD-EPI Creatinine Equation (2021)    Anion gap 14 5 - 15    Comment: Performed at [facility], [address] [place], [place] [address]  CBC     Status: None   Collection Time: 06/12/22 11:35 AM  Result Value Ref Range   WBC 10.3 4.0 - 10.5 K/uL   RBC 4.68 3.87 - 5.11 MIL/uL   Hemoglobin  14.3 12.0 - 15.0 g/dL   HCT 13.0 86.5 - 78.4 %   MCV 94.4 80.0 - 100.0 fL   MCH 30.6 26.0 - 34.0 pg   MCHC 32.4 30.0 - 36.0 g/dL   RDW 69.6 29.5 - 28.4 %   Platelets 372 150 - 400 K/uL   nRBC 0.0 0.0 - 0.2 %    Comment: Performed at [facility] [place] [facility], [address] [place], [place] [address]  Urinalysis, Routine w reflex microscopic -Urine, Clean Catch     Status: Abnormal   Collection Time: 06/12/22  4:09 PM  Result Value Ref Range   Color, Urine AMBER (A) YELLOW    Comment: BIOCHEMICALS MAY BE AFFECTED BY COLOR   APPearance CLOUDY (A) CLEAR   Specific Gravity, Urine 1.018 1.005 - 1.030   pH 6.0 5.0 - 8.0   Glucose, UA NEGATIVE NEGATIVE mg/dL   Hgb urine dipstick NEGATIVE NEGATIVE   Bilirubin Urine NEGATIVE NEGATIVE   Ketones, ur 5 (A) NEGATIVE mg/dL   Protein, ur NEGATIVE  NEGATIVE mg/dL   Nitrite NEGATIVE NEGATIVE   Leukocytes,Ua MODERATE (A) NEGATIVE   RBC / HPF 0-5 0 - 5 RBC/hpf   WBC, UA >50 0 - 5 WBC/hpf   Bacteria, UA MANY (A) NONE SEEN   Squamous Epithelial / HPF 0-5 0 - 5 /HPF   Mucus PRESENT     Comment: Performed at [facility], [address], [place], [place] [address]   DG Foot Complete Right  Result Date: 06/12/2022 CLINICAL DATA:  Pain and difficulty walking status post fall. EXAM: RIGHT FOOT COMPLETE - 3+ VIEW COMPARISON:  None Available. FINDINGS: Acute mildly displaced oblique fracture of the proximal shaft of the third toe proximal phalanx. The fracture does not extend to the articular surface. The distal fracture fragment is displaced laterally approximately 2 mm. No significant impaction or angulation. Acute minimally displaced intra-articular fracture of the medial base of the fourth toe proximal phalanx. The distal fracture fragment is displaced approximately 1 mm laterally. No significant angulation. The fourth toe MTP joint space is intact. No other acute osseous abnormality. 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 Tiny posterior and small plantar calcaneal enthesophytes. Diffuse osteopenia. Vascular calcifications noted at the level of the distal right lower leg and within the right midfoot. IMPRESSION: 1. Acute mildly displaced oblique fracture of the proximal shaft of the third toe proximal phalanx. 2. Acute minimally displaced intra-articular fracture of the medial base of the fourth toe proximal phalanx. Electronically Signed   By: Sherron Ales M.D.   On: 06/12/2022 18:07   DG Pelvis 1-2 Views  Result Date: 06/12/2022 CLINICAL DATA:  Pain after fall EXAM: PELVIS - 1-2 VIEW COMPARISON:  None Available. FINDINGS: There is no evidence of pelvic fracture or diastasis. No pelvic bone lesions are seen. IMPRESSION: Negative. Electronically Signed   By: Gerome Sam III M.D.   On: 06/12/2022 18:00   MR Lumbar Spine W Wo  Contrast  Result Date: 06/12/2022 CLINICAL DATA:  Low back pain, cauda equina syndrome suspected EXAM: MRI LUMBAR SPINE WITHOUT AND WITH CONTRAST TECHNIQUE: Multiplanar and multiecho pulse sequences of the lumbar spine were obtained without and with intravenous contrast. CONTRAST:  8mL GADAVIST GADOBUTROL 1 MMOL/ML IV SOLN COMPARISON:  Radiograph 06/09/2022 FINDINGS: Segmentation:  Standard. Alignment:  Physiologic. Vertebrae: There is no evidence of acute fracture, discitis, or aggressive osseous lesion. Mild degenerative endplate edema on the left posteriorly at S1. Conus medullaris and cauda equina: Conus  extends to the L1 level. Conus and cauda equina appear normal. Paraspinal and other soft tissues: Lower paraspinal muscle atrophy. Disc levels: T11-T12: No significant stenosis. T12-L1: No significant stenosis. L1-L2: Mild disc bulging endplate spurring. No significant spinal canal stenosis. Mild bilateral neural foraminal stenosis. L2-L3: Bilateral disc bulging, left greater than right, moderate left and mild right subarticular and neural foraminal stenosis. There is abutment of the descending left L3 nerve root (series 8, image 15). L3-L4: Mild asymmetric left disc bulging and left facet spurring. Mild left-sided neural foraminal stenosis. No spinal canal stenosis. L4-L5: Trace degenerative anterolisthesis with mild disc bulging, ligamentum hypertrophy and right greater left facet arthropathy. There is moderate right and mild left subarticular narrowing, encroaching the descending right L5 nerve root. There is no significant neural foraminal narrowing. L5-S1: Moderate-severe disc height loss with endplate spurring, ligamentum flavum hypertrophy mild facet arthropathy. There is mild, left greater than right subarticular narrowing encroaching the descending left S1 nerve root. There is moderate left and mild right neural foraminal stenosis. IMPRESSION: Multilevel degenerative changes of the lumbar spine with  varying degrees of mild to moderate subarticular and neural foraminal stenoses, as summarized below. No high-grade spinal canal stenosis. L1-L2: Mild bilateral neural foraminal stenosis. No significant spinal canal stenosis. L2-L3: Moderate left and mild right subarticular and neural foraminal stenosis. Abutment of the descending left L3 nerve root. L3-L4: Mild left-sided neural foraminal stenosis. No spinal canal stenosis. L4-L5: Moderate right and mild left subarticular narrowing, encroaching the descending right L5 nerve root. No significant neural foraminal stenosis. L5-S1: Mild, left greater than right subarticular narrowing encroaching the descending left S1 nerve root. Moderate left and mild right neural foraminal stenosis. Electronically Signed   By: Caprice Renshaw M.D.   On: 06/12/2022 16:28   MR BRAIN WO CONTRAST  Result Date: 06/12/2022 CLINICAL DATA:  Provided history: Transient ischemic attack (TIA). Lightheadedness. Recurrent falls. Lower leg weakness. EXAM: MRI HEAD WITHOUT CONTRAST TECHNIQUE: Multiplanar, multiecho pulse sequences of the brain and surrounding structures were obtained without intravenous contrast. COMPARISON:  Brain MRI 01/07/2021 FINDINGS: Brain: No age advanced or lobar predominant parenchymal atrophy. Small focus of chronic encephalomalacia/gliosis within the inferior right temporal lobe. Chronic lacunar infarct within the right lentiform nucleus. Mild multifocal T2 FLAIR hyperintense signal abnormality elsewhere within the cerebral white matter and pons, nonspecific but compatible with chronic small vessel disease. 2.5 cm chronic infarct within the inferior left cerebellar hemisphere. Gliosis within the ventral aspect of the medulla. There is no acute infarct. No evidence of an intracranial mass. No extra-axial fluid collection. No midline shift. Vascular: Maintained flow voids within the proximal large arterial vessels. Skull and upper cervical spine: No focal suspicious marrow  lesion. Susceptibility artifact arising from the cervical spine at the C3-C4 level, presumably from fusion hardware or a disc prosthesis. Sinuses/Orbits: No mass or acute finding within the imaged orbits. Prior but ocular lens replacement. 5 mm mucous retention cyst within the left maxillary sinus. IMPRESSION: 1.  No evidence of an acute intracranial abnormality. 2. Stable non-contrast MRI appearance of the brain as compared to 01/07/2021. 3. Small focus of chronic encephalomalacia/gliosis within the inferior right temporal lobe, which may reflect a chronic infarct, or may be posttraumatic in etiology. 4. 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 5. Mild chronic small vessel ischemic changes within the cerebral white matter and pons. 6. Redemonstrated nonspecific gliosis within the ventral aspect of the medulla. 7. 5 mm left maxillary sinus mucous retention cyst. Electronically Signed  By: Jackey Loge D.O.   On: 06/12/2022 15:01    Pending Labs Unresulted Labs (From admission, onward)     Start     Ordered   06/12/22 1635  Urine Culture (for pregnant, neutropenic or urologic patients or patients with an indwelling urinary catheter)  (Urine Labs)  Add-on,   AD       Question:  Indication  Answer:  Bacteriuria screening (OB/GYN or Uro)   06/12/22 1634   06/12/22 1624  CK  Add-on,   AD        06/12/22 1623   06/12/22 1624  Magnesium  Add-on,   AD        06/12/22 1623            Vitals/Pain Today's Vitals   06/12/22 1345 06/12/22 1556 06/12/22 1607 06/12/22 1621  BP: (!) 197/83  (!) 171/62   Pulse: 79  80   Resp: 19  17   Temp:    98.4 F (36.9 C)  TempSrc:    Oral  SpO2: 99%  99%   Weight:      Height:      PainSc:  0-No pain      Isolation Precautions No active isolations  Medications Medications  hydrochlorothiazide (HYDRODIURIL) tablet 12.5 mg (12.5 mg Oral Given 06/12/22 1334)  amLODipine (NORVASC) tablet 5 mg (5 mg Oral Given 06/12/22  1335)  lisinopril (ZESTRIL) tablet 40 mg (40 mg Oral Given 06/12/22 1335)  morphine (PF) 2 MG/ML injection 2 mg (2 mg Intravenous Given 06/12/22 1414)  gadobutrol (GADAVIST) 1 MMOL/ML injection 8 mL (8 mLs Intravenous Contrast Given 06/12/22 1511)  cefTRIAXone (ROCEPHIN) 1 g in sodium chloride 0.9 % 100 mL IVPB (0 g Intravenous Stopped 06/12/22 1840)    Mobility walks with device     Focused Assessments    R Recommendations: See Admitting Provider Note  Report given to:   Additional Notes: Patient is A&Ox4, thought she was going home but provider convinced her to stay for PT/OT eval and get help with her strength and abx for the UTI. Lives with son who is very helpful.

## 2022-06-12 NOTE — ED Triage Notes (Signed)
Patient reports having difficulty walking and needing to hold on to things.  Reports she has had multiple falls and unable to get up.  Reports bilateral leg pain.  Patient has known chronic left leg pain.  Patient reports she had incontinence when she fell this morning.  No neuro deficits noted LVO-  No unilateral weakness, slurred speech or neglect.

## 2022-06-12 NOTE — ED Provider Notes (Signed)
[place] EMERGENCY DEPARTMENT AT [facility] Provider Note   CSN: [id number] Arrival date & time: 06/12/22  1119     History Chief Complaint  Patient presents with   Gait Problem    Nancy Schroeder is a 89 y.o. female with h/o hypertension, chronic low back pain, chronic lower leg weakness presents emergency room today for evaluation of worsening lower leg weakness for the past 2 to 3 months.  She has been having recurrent falls at home during this time but reports they have been worsening in the past few weeks.  She feels that her legs are becoming weaker and are also causing her pain as well.  She reports that she has lower back pain that is been chronic since a work accident she had.  She fell on her deck yesterday and fell in the shower today.  She thinks she may have hit her head yesterday but does not have any head pain now.  Denies any neck pain.  She is not having trouble standing up from the toilet and needs assistance.  She does live with her son who cares for her full-time.  She usually needs a walker or uses the back of her wheelchair to steady herself however with these falls she was not using those.  She is not on any blood thinners.  She is current complaining of some right foot pain from her injury a few days ago.  She reports that whenever she fell in the shower today she did have some urinary incontinence, but has been able to control her bladder since.  Denies any fecal incontinence, saddle anesthesia, fevers, or any history of any IV drug use.  She denies any chest pain, belly pain, shortness of breath.  Denies any dysuria, hematuria, melena, or hematochezia.  She reports that she has been feeling lightheaded for the past year, but no new changes to this.  Not the reason of her falls which are mechanical.  She is already seen her primary care doctor about this issue and has a scheduled appointment with the back specialist in [place], but wants to know why she is  feeling so weak.  HPI     Home Medications Prior to Admission medications   Medication Sig Start Date End Date Taking? Authorizing Provider  amLODipine (NORVASC) 5 MG tablet Take 1 tablet (5 mg total) by mouth daily. Hypertension 05/19/22   Del Nigel Berthold, FNP  aspirin EC 81 MG tablet Take 81 mg by mouth daily. Swallow whole.    [provider]  Blood Pressure Monitoring (BLOOD PRESSURE KIT) DEVI Once a day 05/19/22   Del Newman Nip, Tenna Child, FNP  busPIRone (BUSPAR) 10 MG tablet TAKE 1 TABLET BY MOUTH DAILY AS NEEDED 05/24/22   Anabel Halon, MD  carbamazepine (TEGRETOL) 200 MG tablet Take 1 tablet (200 mg total) by mouth in the morning and at bedtime. 07/22/21 05/19/22  Windell Norfolk, MD  diphenhydrAMINE (BENADRYL) 25 mg capsule Take 25 mg by mouth at bedtime as needed.    [provider]  DULoxetine (CYMBALTA) 20 MG capsule Take 1 capsule (20 mg total) by mouth daily. 06/09/22   Del Nigel Berthold, FNP  hydrochlorothiazide (HYDRODIURIL) 12.5 MG tablet Take 1 tablet (12.5 mg total) by mouth daily. Hypertension 05/19/22   Del Nigel Berthold, FNP  ibuprofen (ADVIL) 800 MG tablet TAKE 1 TABLET(800 MG) BY MOUTH EVERY 8 HOURS AS NEEDED 05/24/22   Anabel Halon, MD  levothyroxine (SYNTHROID) 25 MCG  tablet Take 1 tablet (25 mcg total) by mouth daily before breakfast. 03/29/22   Anabel Halon, MD  lidocaine-prilocaine (EMLA) cream Apply 1 Application topically as needed. 05/19/22   Del Nigel Berthold, FNP  lisinopril (ZESTRIL) 40 MG tablet Take 1 tablet (40 mg total) by mouth daily. Hypertension 05/19/22   Del Nigel Berthold, FNP  Psyllium (METAMUCIL FIBER PO) Take 2 capsules by mouth 3 (three) times daily.    [provider]  tiZANidine (ZANAFLEX) 2 MG tablet Take 1 tablet (2 mg total) by mouth every 8 (eight) hours as needed for muscle spasms. Do not take with alcohol or while driving or operating heavy machinery.  May cause drowsiness.  05/19/22   Del Nigel Berthold, FNP  triamcinolone cream (KENALOG) 0.1 % Apply 1 Application topically 2 (two) times daily. 03/23/22   Valentino Nose, NP      Allergies    Patient has no known allergies.    Review of Systems   Review of Systems  Constitutional:  Negative for chills and fever.  Respiratory:  Negative for shortness of breath.   Cardiovascular:  Negative for chest pain.  Gastrointestinal:  Negative for abdominal distention, constipation, diarrhea, nausea and vomiting.       Denies any fecal incontinence  Genitourinary:  Negative for dysuria and hematuria.       Denies any urinary retention.  Reports 1 episode of urinary incontinence but has been able to control her bladder since.  Musculoskeletal:  Positive for back pain and myalgias. Negative for neck pain.       Denies any saddle anesthesia  Neurological:  Negative for weakness and numbness.    Physical Exam Updated Vital Signs BP (!) 158/56   Pulse 82   Temp 98 F (36.7 C) (Oral)   Resp 18   Ht 5' (1.524 m)   Wt 79.8 kg   SpO2 97%   BMI 34.37 kg/m  Physical Exam Vitals and nursing note reviewed.  Constitutional:      General: She is not in acute distress.    Appearance: She is not toxic-appearing.  HENT:     Right Ear: Tympanic membrane, ear canal and external ear normal.     Left Ear: Tympanic membrane, ear canal and external ear normal.     Mouth/Throat:     Mouth: Mucous membranes are moist.  Eyes:     Extraocular Movements: Extraocular movements intact.     Pupils: Pupils are equal, round, and reactive to light.  Cardiovascular:     Rate and Rhythm: Normal rate.     Pulses: Normal pulses.  Pulmonary:     Effort: Pulmonary effort is normal. No respiratory distress.     Breath sounds: Normal breath sounds.  Abdominal:     General: Abdomen is flat.     Palpations: Abdomen is soft.     Tenderness: There is no abdominal tenderness. There is no guarding or rebound.  Musculoskeletal:         General: Signs of injury present.     Cervical back: Normal range of motion. No rigidity or tenderness.     Right lower leg: No edema.     Left lower leg: No edema.     Comments: Weakness to bilateral lower extremities. Diffuse tenderness in the bilateral lower extremities. No focal tenderness. Compartments are soft. Cap refill 2-3 seconds. Palpable DP and PT pulses bilaterally that are symmetric. No rash or overlying skin changes noted. No crepitus. No edema.  She does have some bruising to the dorsum of her right foot.   Back appears atraumatic. Some lower lumbar paraspinal tenderness to palpation, right > left. No other midline tenderness to palpation.    Strength is intact and equal in upper extremities. No signs of trauma. Non tender to palpation.   Skin:    General: Skin is warm and dry.     Capillary Refill: Capillary refill takes less than 2 seconds.  Neurological:     Mental Status: She is alert.     GCS: GCS eye subscore is 4. GCS verbal subscore is 5. GCS motor subscore is 6.     Cranial Nerves: No cranial nerve deficit, dysarthria or facial asymmetry.     Sensory: No sensory deficit.     Motor: Weakness present. No pronator drift.     Comments: Strength 3/5 in bilateral lower extremities. Sensation reportedly is intact and symmetric per patient.      ED Results / Procedures / Treatments   Labs (all labs ordered are listed, but only abnormal results are displayed) Labs Reviewed  BASIC METABOLIC PANEL - Abnormal; Notable for the following components:      Result Value   Glucose, Bld 123 (*)    All other components within normal limits  URINALYSIS, ROUTINE W REFLEX MICROSCOPIC - Abnormal; Notable for the following components:   Color, Urine AMBER (*)    APPearance CLOUDY (*)    Ketones, ur 5 (*)    Leukocytes,Ua MODERATE (*)    Bacteria, UA MANY (*)    All other components within normal limits  URINE CULTURE  CBC  CK  MAGNESIUM  BASIC METABOLIC PANEL  CBC     EKG EKG Interpretation  Date/Time:  Saturday Jun 12 2022 11:28:56 EDT Ventricular Rate:  89 PR Interval:  184 QRS Duration: 84 QT Interval:  394 QTC Calculation: 479 R Axis:   5 Text Interpretation: Normal sinus rhythm Confirmed by Virgina Norfolk ([id number]) on 06/12/2022 12:07:47 PM  Radiology DG Foot Complete Right  Result Date: 06/12/2022 CLINICAL DATA:  Pain and difficulty walking status post fall. EXAM: RIGHT FOOT COMPLETE - 3+ VIEW COMPARISON:  None Available. FINDINGS: Acute mildly displaced oblique fracture of the proximal shaft of the third toe proximal phalanx. The fracture does not extend to the articular surface. The distal fracture fragment is displaced laterally approximately 2 mm. No significant impaction or angulation. Acute minimally displaced intra-articular fracture of the medial base of the fourth toe proximal phalanx. The distal fracture fragment is displaced approximately 1 mm laterally. No significant angulation. The fourth toe MTP joint space is intact. No other acute osseous abnormality. 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 Tiny posterior and small plantar calcaneal enthesophytes. Diffuse osteopenia. Vascular calcifications noted at the level of the distal right lower leg and within the right midfoot. IMPRESSION: 1. Acute mildly displaced oblique fracture of the proximal shaft of the third toe proximal phalanx. 2. Acute minimally displaced intra-articular fracture of the medial base of the fourth toe proximal phalanx. Electronically Signed   By: Sherron Ales M.D.   On: 06/12/2022 18:07   DG Pelvis 1-2 Views  Result Date: 06/12/2022 CLINICAL DATA:  Pain after fall EXAM: PELVIS - 1-2 VIEW COMPARISON:  None Available. FINDINGS: There is no evidence of pelvic fracture or diastasis. No pelvic bone lesions are seen. IMPRESSION: Negative. Electronically Signed   By: Gerome Sam III M.D.   On: 06/12/2022 18:00   MR Lumbar Spine W Wo Contrast  Result Date:  06/12/2022 CLINICAL DATA:  Low back pain, cauda equina syndrome suspected EXAM: MRI LUMBAR SPINE WITHOUT AND WITH CONTRAST TECHNIQUE: Multiplanar and multiecho pulse sequences of the lumbar spine were obtained without and with intravenous contrast. CONTRAST:  8mL GADAVIST GADOBUTROL 1 MMOL/ML IV SOLN COMPARISON:  Radiograph 06/09/2022 FINDINGS: Segmentation:  Standard. Alignment:  Physiologic. Vertebrae: There is no evidence of acute fracture, discitis, or aggressive osseous lesion. Mild degenerative endplate edema on the left posteriorly at S1. Conus medullaris and cauda equina: Conus extends to the L1 level. Conus and cauda equina appear normal. Paraspinal and other soft tissues: Lower paraspinal muscle atrophy. Disc levels: T11-T12: No significant stenosis. T12-L1: No significant stenosis. L1-L2: Mild disc bulging endplate spurring. No significant spinal canal stenosis. Mild bilateral neural foraminal stenosis. L2-L3: Bilateral disc bulging, left greater than right, moderate left and mild right subarticular and neural foraminal stenosis. There is abutment of the descending left L3 nerve root (series 8, image 15). L3-L4: Mild asymmetric left disc bulging and left facet spurring. Mild left-sided neural foraminal stenosis. No spinal canal stenosis. L4-L5: Trace degenerative anterolisthesis with mild disc bulging, ligamentum hypertrophy and right greater left facet arthropathy. There is moderate right and mild left subarticular narrowing, encroaching the descending right L5 nerve root. There is no significant neural foraminal narrowing. L5-S1: Moderate-severe disc height loss with endplate spurring, ligamentum flavum hypertrophy mild facet arthropathy. There is mild, left greater than right subarticular narrowing encroaching the descending left S1 nerve root. There is moderate left and mild right neural foraminal stenosis. IMPRESSION: Multilevel degenerative changes of the lumbar spine with varying degrees of mild to  moderate subarticular and neural foraminal stenoses, as summarized below. No high-grade spinal canal stenosis. L1-L2: Mild bilateral neural foraminal stenosis. No significant spinal canal stenosis. L2-L3: Moderate left and mild right subarticular and neural foraminal stenosis. Abutment of the descending left L3 nerve root. L3-L4: Mild left-sided neural foraminal stenosis. No spinal canal stenosis. L4-L5: Moderate right and mild left subarticular narrowing, encroaching the descending right L5 nerve root. No significant neural foraminal stenosis. L5-S1: Mild, left greater than right subarticular narrowing encroaching the descending left S1 nerve root. Moderate left and mild right neural foraminal stenosis. Electronically Signed   By: Caprice Renshaw M.D.   On: 06/12/2022 16:28   MR BRAIN WO CONTRAST  Result Date: 06/12/2022 CLINICAL DATA:  Provided history: Transient ischemic attack (TIA). Lightheadedness. Recurrent falls. Lower leg weakness. EXAM: MRI HEAD WITHOUT CONTRAST TECHNIQUE: Multiplanar, multiecho pulse sequences of the brain and surrounding structures were obtained without intravenous contrast. COMPARISON:  Brain MRI 01/07/2021 FINDINGS: Brain: No age advanced or lobar predominant parenchymal atrophy. Small focus of chronic encephalomalacia/gliosis within the inferior right temporal lobe. Chronic lacunar infarct within the right lentiform nucleus. Mild multifocal T2 FLAIR hyperintense signal abnormality elsewhere within the cerebral white matter and pons, nonspecific but compatible with chronic small vessel disease. 2.5 cm chronic infarct within the inferior left cerebellar hemisphere. Gliosis within the ventral aspect of the medulla. There is no acute infarct. No evidence of an intracranial mass. No extra-axial fluid collection. No midline shift. Vascular: Maintained flow voids within the proximal large arterial vessels. Skull and upper cervical spine: No focal suspicious marrow lesion. Susceptibility  artifact arising from the cervical spine at the C3-C4 level, presumably from fusion hardware or a disc prosthesis. Sinuses/Orbits: No mass or acute finding within the imaged orbits. Prior but ocular lens replacement. 5 mm mucous retention cyst within the left maxillary sinus. IMPRESSION: 1.  No evidence of  an acute intracranial abnormality. 2. Stable non-contrast MRI appearance of the brain as compared to 01/07/2021. 3. Small focus of chronic encephalomalacia/gliosis within the inferior right temporal lobe, which may reflect a chronic infarct, or may be posttraumatic in etiology. 4. 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 5. Mild chronic small vessel ischemic changes within the cerebral white matter and pons. 6. Redemonstrated nonspecific gliosis within the ventral aspect of the medulla. 7. 5 mm left maxillary sinus mucous retention cyst. Electronically Signed   By: Jackey Loge D.O.   On: 06/12/2022 15:01    Procedures Procedures   Medications Ordered in ED Medications  labetalol (NORMODYNE) injection 20 mg (has no administration in time range)  aspirin EC tablet 81 mg (has no administration in time range)  amLODipine (NORVASC) tablet 5 mg (has no administration in time range)  hydrochlorothiazide (HYDRODIURIL) tablet 12.5 mg (has no administration in time range)  lisinopril (ZESTRIL) tablet 40 mg (has no administration in time range)  busPIRone (BUSPAR) tablet 10 mg (has no administration in time range)  DULoxetine (CYMBALTA) DR capsule 20 mg (has no administration in time range)  levothyroxine (SYNTHROID) tablet 25 mcg (has no administration in time range)  psyllium (HYDROCIL/METAMUCIL) 2 packet (has no administration in time range)  carbamazepine (TEGRETOL) tablet 200 mg (has no administration in time range)  tiZANidine (ZANAFLEX) tablet 2 mg (has no administration in time range)  diphenhydrAMINE (BENADRYL) capsule 25 mg (has no administration in time  range)  enoxaparin (LOVENOX) injection 40 mg (has no administration in time range)  0.9 %  sodium chloride infusion (has no administration in time range)  acetaminophen (TYLENOL) tablet 650 mg (has no administration in time range)    Or  acetaminophen (TYLENOL) suppository 650 mg (has no administration in time range)  traZODone (DESYREL) tablet 25 mg (has no administration in time range)  magnesium hydroxide (MILK OF MAGNESIA) suspension 30 mL (has no administration in time range)  ondansetron (ZOFRAN) tablet 4 mg (has no administration in time range)    Or  ondansetron (ZOFRAN) injection 4 mg (has no administration in time range)  cefTRIAXone (ROCEPHIN) 1 g in sodium chloride 0.9 % 100 mL IVPB (has no administration in time range)  amLODipine (NORVASC) tablet 5 mg (5 mg Oral Given 06/12/22 1335)  lisinopril (ZESTRIL) tablet 40 mg (40 mg Oral Given 06/12/22 1335)  morphine (PF) 2 MG/ML injection 2 mg (2 mg Intravenous Given 06/12/22 1414)  gadobutrol (GADAVIST) 1 MMOL/ML injection 8 mL (8 mLs Intravenous Contrast Given 06/12/22 1511)  cefTRIAXone (ROCEPHIN) 1 g in sodium chloride 0.9 % 100 mL IVPB (0 g Intravenous Stopped 06/12/22 1840)    ED Course/ Medical Decision Making/ A&P   {                           Medical Decision Making Amount and/or Complexity of Data Reviewed Labs: ordered. Radiology: ordered.  Risk Prescription drug management. Decision regarding hospitalization.   89 y.o. female presents to the ER for evaluation of lower back pain with bilateral lower leg weakness, recurrent falls. Differential diagnosis includes but is not limited to Fracture (acute/chronic), muscle strain, cauda equina, spinal stenosis, DDD, ligamentous injury, disk herniation, metastatic cancer, vertebral osteomyelitis, kidney stone, pyelonephritis, AAA, pancreatitis, bowel obstruction, meningitis. Vital signs elevated blood pressure however patient did not take her morning blood pressure medication.   Blood pressure medication ordered.  Otherwise, afebrile, normal pulse rate, satting well room  air without increased work of breathing. Physical exam as noted above.   Given the patient's episode of urinary incontinence as well as her lower back pain with bilateral lower leg weakness, will order MRI.  Basic lab work ordered as well.  Will order MRI of her head.  I independently reviewed and interpreted the patient's labs.  CK within normal limits.  Magnesium within normal limits.  BMP does show mildly elevated glucose at 123 otherwise no electrolyte or LFT or [place].  CBC with a leukocytosis or anemia.  Urinalysis consistent with UTI with amber and cloudy urine.  There is 5 ketones present but there is moderate amount of leukocytes with many bacteria and greater than 50 white blood cells present.  Urine culture pending.  MRI of the back shows  Multilevel degenerative changes of the lumbar spine with varying degrees of mild to moderate subarticular and neural foraminal stenoses, as summarized below. No high-grade spinal canal stenosis. L1-L2: Mild bilateral neural foraminal stenosis. No significant spinal canal stenosis. L2-L3: Moderate left and mild right subarticular and neural foraminal stenosis. Abutment of the descending left L3 nerve root. L3-L4: Mild left-sided neural foraminal stenosis. No spinal canal stenosis. L4-L5: Moderate right and mild left subarticular narrowing, encroaching the descending right L5 nerve root. No significant neural foraminal stenosis. L5-S1: Mild, left greater than right subarticular narrowing encroaching the descending left S1 nerve root. Moderate left and mild right neural foraminal stenosis.   MRI of the head shows 1.  No evidence of an acute intracranial abnormality. 2. Stable non-contrast MRI appearance of the brain as compared to 01/07/2021. 3. Small focus of chronic encephalomalacia/gliosis within the inferior right temporal lobe, which may reflect a chronic infarct, or  may be posttraumatic in etiology. 4. 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 5. Mild chronic small vessel ischemic changes within the cerebral white matter and pons. 6. Redemonstrated nonspecific gliosis within the ventral aspect of the medulla. 7. 5 mm left maxillary sinus mucous retention cyst.   On ambulation, patient requires 2 person assist to get to the bathroom.  No foot dragging appreciated.  MRI does not show any emergent surgical intervention for her back.  Her MRI of her head does not show any new acute changes.  Her urine does show clear signs of a urinary tract infection.  Could be explaining her worsening weakness.  I had a long discussion with the patient and son at bedside about options including inpatient treatment versus outpatient treatment.  They have decided inpatient treatment for UTI as well as PT and OT evaluation and consultation for the patient's recurrent falls and lower leg weakness.  Rocephin ordered for UTI.  Buddy tape ordered for patient's broken toes.  Hospitalist admit to medicine.  I discussed this case with my attending physician who cosigned this note including patient's presenting symptoms, physical exam, and planned diagnostics and interventions. Attending physician stated agreement with plan or made changes to plan which were implemented.   Portions of this report may have been transcribed using voice recognition software. Every effort was made to ensure accuracy; however, inadvertent computerized transcription errors may be present.    Final Clinical Impression(s) / ED Diagnoses Final diagnoses:  Acute cystitis without hematuria  Recurrent falls  Acute bilateral low back pain with bilateral sciatica    Rx / DC Orders ED Discharge Orders     None         Achille Rich, PA-C 06/12/22 2309    Dixon,  Alycia Rossetti, MD 06/16/22 0000

## 2022-06-12 NOTE — H&P (Incomplete)
[place]   PATIENT NAME: Nancy Schroeder    MR#:  [id number]  DATE OF BIRTH:  [birth year]  DATE OF ADMISSION:  06/12/2022  PRIMARY CARE PHYSICIAN: Anabel Halon, MD   Patient is coming from: Home  REQUESTING/REFERRING PHYSICIAN:  Achille Rich, PA-C  CHIEF COMPLAINT:   Chief Complaint  Patient presents with   Gait Problem    HISTORY OF PRESENT ILLNESS:  Nancy Schroeder is a 89 y.o. Caucasian female with medical history significant for anxiety, depression, essential hypertension, who presented to the emergency room with a current onset of recurrent falls secondary to generalized lower extremity weakness.  She denied any presyncope or syncope.  No paresthesias or focal muscle weakness.  No dysuria but she admits to urinary frequency and urgency.  She has been having headache and dizziness without diplopia or vertigo or tinnitus.  No chest pain or palpitations.  No cough or wheezing or dyspnea.  She denies any fever or chills. She reports that she has lower back pain that is been chronic since a work accident she had. She fell on her deck yesterday and fell in the shower today. She thinks she may have hit her head yesterday but does not have any head pain now and denies any neck pain.   ED Course: When she came to the ER, BP was 206/79 with otherwise normal vital signs.  Labs revealed unremarkable CBC and CMP.  UA was positive for UTI. EKG as reviewed by me : EKG showed normal sinus rhythm with a rate of 89 with poor R wave progression and T wave inversion inferiorly. Imaging: Pelvic x-ray was negative and right foot x-ray showed acute nondisplaced oblique fracture of the proximal shaft of the third toe proximal phalanx and acute minimally displaced intra-articular fracture of the medial base of the fourth toe proximal phalanx.  Lumbar spine MRI with and without contrast revealed the following: Multilevel degenerative changes of the lumbar spine with varying degrees of  mild to moderate subarticular and neural foraminal stenoses, as summarized below. No high-grade spinal canal stenosis.   L1-L2: Mild bilateral neural foraminal stenosis. No significant spinal canal stenosis.   L2-L3: Moderate left and mild right subarticular and neural foraminal stenosis. Abutment of the descending left L3 nerve root.   L3-L4: Mild left-sided neural foraminal stenosis. No spinal canal stenosis.   L4-L5: Moderate right and mild left subarticular narrowing, encroaching the descending right L5 nerve root. No significant neural foraminal stenosis.   L5-S1: Mild, left greater than right subarticular narrowing encroaching the descending left S1 nerve root. Moderate left and mild right neural foraminal stenosis.  The patient was given a gram of IV Rocephin and 2 mg of IV morphine sulfate as well as p.o. Zestril, amlodipine and HCTZ.  She will be admitted to a medical bed for further evaluation and management. PAST MEDICAL HISTORY:   Past Medical History:  Diagnosis Date   Anxiety    Depression    Essential hypertension    H/O back injury 2016    PAST SURGICAL HISTORY:   Past Surgical History:  Procedure Laterality Date   ABDOMINAL HYSTERECTOMY     total   CATARACT EXTRACTION, BILATERAL  01/2020   CHOLECYSTECTOMY     NECK SURGERY      SOCIAL HISTORY:   Social History   Tobacco Use   Smoking status: Never   Smokeless tobacco: Never  Substance Use Topics   Alcohol use: Yes    Comment: 1-2 drinks  per 6 months    FAMILY HISTORY:   Family History  Problem Relation Age of Onset   Diabetes Mother    Diabetes Father    Diabetes Sister     DRUG ALLERGIES:  No Known Allergies  REVIEW OF SYSTEMS:   ROS As per history of present illness. All pertinent systems were reviewed above. Constitutional, HEENT, cardiovascular, respiratory, GI, GU, musculoskeletal, neuro, psychiatric, endocrine, integumentary and hematologic systems were reviewed and are  otherwise negative/unremarkable except for positive findings mentioned above in the HPI.   MEDICATIONS AT HOME:   Prior to Admission medications   Medication Sig Start Date End Date Taking? Authorizing Provider  amLODipine (NORVASC) 5 MG tablet Take 1 tablet (5 mg total) by mouth daily. Hypertension 05/19/22   Del Nigel Berthold, FNP  aspirin EC 81 MG tablet Take 81 mg by mouth daily. Swallow whole.    [provider]  Blood Pressure Monitoring (BLOOD PRESSURE KIT) DEVI Once a day 05/19/22   Del Newman Nip, Tenna Child, FNP  busPIRone (BUSPAR) 10 MG tablet TAKE 1 TABLET BY MOUTH DAILY AS NEEDED 05/24/22   Anabel Halon, MD  carbamazepine (TEGRETOL) 200 MG tablet Take 1 tablet (200 mg total) by mouth in the morning and at bedtime. 07/22/21 05/19/22  Windell Norfolk, MD  diphenhydrAMINE (BENADRYL) 25 mg capsule Take 25 mg by mouth at bedtime as needed.    [provider]  DULoxetine (CYMBALTA) 20 MG capsule Take 1 capsule (20 mg total) by mouth daily. 06/09/22   Del Nigel Berthold, FNP  hydrochlorothiazide (HYDRODIURIL) 12.5 MG tablet Take 1 tablet (12.5 mg total) by mouth daily. Hypertension 05/19/22   Del Newman Nip, Tenna Child, FNP  ibuprofen (ADVIL) 800 MG tablet TAKE 1 TABLET(800 MG) BY MOUTH EVERY 8 HOURS AS NEEDED 05/24/22   Anabel Halon, MD  levothyroxine (SYNTHROID) 25 MCG tablet Take 1 tablet (25 mcg total) by mouth daily before breakfast. 03/29/22   Anabel Halon, MD  lidocaine-prilocaine (EMLA) cream Apply 1 Application topically as needed. 05/19/22   Del Nigel Berthold, FNP  lisinopril (ZESTRIL) 40 MG tablet Take 1 tablet (40 mg total) by mouth daily. Hypertension 05/19/22   Del Nigel Berthold, FNP  Psyllium (METAMUCIL FIBER PO) Take 2 capsules by mouth 3 (three) times daily.    [provider]  tiZANidine (ZANAFLEX) 2 MG tablet Take 1 tablet (2 mg total) by mouth every 8 (eight) hours as needed for muscle spasms. Do not take with alcohol or  while driving or operating heavy machinery.  May cause drowsiness. 05/19/22   Del Nigel Berthold, FNP  triamcinolone cream (KENALOG) 0.1 % Apply 1 Application topically 2 (two) times daily. 03/23/22   Valentino Nose, NP      VITAL SIGNS:  Blood pressure (!) 158/56, pulse 82, temperature 98 F (36.7 C), temperature source Oral, resp. rate 18, height 5' (1.524 m), weight 79.8 kg, SpO2 97 %.  PHYSICAL EXAMINATION:  Physical Exam  GENERAL:  89 y.o.-year-old Caucasian female patient lying in the bed with no acute distress.  EYES: Pupils equal, round, reactive to light and accommodation. No scleral icterus. Extraocular muscles intact.  HEENT: Head atraumatic, normocephalic. Oropharynx and nasopharynx clear.  NECK:  Supple, no jugular venous distention. No thyroid enlargement, no tenderness.  LUNGS: Normal breath sounds bilaterally, no wheezing, rales,rhonchi or crepitation. No use of accessory muscles of respiration.  CARDIOVASCULAR: Regular rate and rhythm, S1, S2 normal. No murmurs, rubs, or gallops.  ABDOMEN: Soft,  nondistended, nontender. Bowel sounds present. No organomegaly or mass.  EXTREMITIES: No pedal edema, cyanosis, or clubbing.  NEUROLOGIC: Cranial nerves II through XII are intact. Muscle strength 5/5 in all extremities. Sensation intact. Gait not checked.  PSYCHIATRIC: The patient is alert and oriented x 3.  Normal affect and good eye contact. SKIN: No obvious rash, lesion, or ulcer.   LABORATORY PANEL:   CBC Recent Labs  Lab 06/12/22 1135  WBC 10.3  HGB 14.3  HCT 44.2  PLT 372   ------------------------------------------------------------------------------------------------------------------  Chemistries  Recent Labs  Lab 06/12/22 1135  NA 140  K 4.1  CL 104  CO2 22  GLUCOSE 123*  BUN 18  CREATININE 0.72  CALCIUM 9.5  MG 2.1    ------------------------------------------------------------------------------------------------------------------  Cardiac Enzymes No results for input(s): "TROPONINI" in the last 168 hours. ------------------------------------------------------------------------------------------------------------------  RADIOLOGY:  DG Foot Complete Right  Result Date: 06/12/2022 CLINICAL DATA:  Pain and difficulty walking status post fall. EXAM: RIGHT FOOT COMPLETE - 3+ VIEW COMPARISON:  None Available. FINDINGS: Acute mildly displaced oblique fracture of the proximal shaft of the third toe proximal phalanx. The fracture does not extend to the articular surface. The distal fracture fragment is displaced laterally approximately 2 mm. No significant impaction or angulation. Acute minimally displaced intra-articular fracture of the medial base of the fourth toe proximal phalanx. The distal fracture fragment is displaced approximately 1 mm laterally. No significant angulation. The fourth toe MTP joint space is intact. No other acute osseous abnormality. 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 Tiny posterior and small plantar calcaneal enthesophytes. Diffuse osteopenia. Vascular calcifications noted at the level of the distal right lower leg and within the right midfoot. IMPRESSION: 1. Acute mildly displaced oblique fracture of the proximal shaft of the third toe proximal phalanx. 2. Acute minimally displaced intra-articular fracture of the medial base of the fourth toe proximal phalanx. Electronically Signed   By: Sherron Ales M.D.   On: 06/12/2022 18:07   DG Pelvis 1-2 Views  Result Date: 06/12/2022 CLINICAL DATA:  Pain after fall EXAM: PELVIS - 1-2 VIEW COMPARISON:  None Available. FINDINGS: There is no evidence of pelvic fracture or diastasis. No pelvic bone lesions are seen. IMPRESSION: Negative. Electronically Signed   By: Gerome Sam III M.D.   On: 06/12/2022 18:00   MR Lumbar Spine W Wo  Contrast  Result Date: 06/12/2022 CLINICAL DATA:  Low back pain, cauda equina syndrome suspected EXAM: MRI LUMBAR SPINE WITHOUT AND WITH CONTRAST TECHNIQUE: Multiplanar and multiecho pulse sequences of the lumbar spine were obtained without and with intravenous contrast. CONTRAST:  8mL GADAVIST GADOBUTROL 1 MMOL/ML IV SOLN COMPARISON:  Radiograph 06/09/2022 FINDINGS: Segmentation:  Standard. Alignment:  Physiologic. Vertebrae: There is no evidence of acute fracture, discitis, or aggressive osseous lesion. Mild degenerative endplate edema on the left posteriorly at S1. Conus medullaris and cauda equina: Conus extends to the L1 level. Conus and cauda equina appear normal. Paraspinal and other soft tissues: Lower paraspinal muscle atrophy. Disc levels: T11-T12: No significant stenosis. T12-L1: No significant stenosis. L1-L2: Mild disc bulging endplate spurring. No significant spinal canal stenosis. Mild bilateral neural foraminal stenosis. L2-L3: Bilateral disc bulging, left greater than right, moderate left and mild right subarticular and neural foraminal stenosis. There is abutment of the descending left L3 nerve root (series 8, image 15). L3-L4: Mild asymmetric left disc bulging and left facet spurring. Mild left-sided neural foraminal stenosis. No spinal canal stenosis. L4-L5: Trace degenerative anterolisthesis with mild disc bulging, ligamentum hypertrophy and  right greater left facet arthropathy. There is moderate right and mild left subarticular narrowing, encroaching the descending right L5 nerve root. There is no significant neural foraminal narrowing. L5-S1: Moderate-severe disc height loss with endplate spurring, ligamentum flavum hypertrophy mild facet arthropathy. There is mild, left greater than right subarticular narrowing encroaching the descending left S1 nerve root. There is moderate left and mild right neural foraminal stenosis. IMPRESSION: Multilevel degenerative changes of the lumbar spine with  varying degrees of mild to moderate subarticular and neural foraminal stenoses, as summarized below. No high-grade spinal canal stenosis. L1-L2: Mild bilateral neural foraminal stenosis. No significant spinal canal stenosis. L2-L3: Moderate left and mild right subarticular and neural foraminal stenosis. Abutment of the descending left L3 nerve root. L3-L4: Mild left-sided neural foraminal stenosis. No spinal canal stenosis. L4-L5: Moderate right and mild left subarticular narrowing, encroaching the descending right L5 nerve root. No significant neural foraminal stenosis. L5-S1: Mild, left greater than right subarticular narrowing encroaching the descending left S1 nerve root. Moderate left and mild right neural foraminal stenosis. Electronically Signed   By: Caprice Renshaw M.D.   On: 06/12/2022 16:28   MR BRAIN WO CONTRAST  Result Date: 06/12/2022 CLINICAL DATA:  Provided history: Transient ischemic attack (TIA). Lightheadedness. Recurrent falls. Lower leg weakness. EXAM: MRI HEAD WITHOUT CONTRAST TECHNIQUE: Multiplanar, multiecho pulse sequences of the brain and surrounding structures were obtained without intravenous contrast. COMPARISON:  Brain MRI 01/07/2021 FINDINGS: Brain: No age advanced or lobar predominant parenchymal atrophy. Small focus of chronic encephalomalacia/gliosis within the inferior right temporal lobe. Chronic lacunar infarct within the right lentiform nucleus. Mild multifocal T2 FLAIR hyperintense signal abnormality elsewhere within the cerebral white matter and pons, nonspecific but compatible with chronic small vessel disease. 2.5 cm chronic infarct within the inferior left cerebellar hemisphere. Gliosis within the ventral aspect of the medulla. There is no acute infarct. No evidence of an intracranial mass. No extra-axial fluid collection. No midline shift. Vascular: Maintained flow voids within the proximal large arterial vessels. Skull and upper cervical spine: No focal suspicious marrow  lesion. Susceptibility artifact arising from the cervical spine at the C3-C4 level, presumably from fusion hardware or a disc prosthesis. Sinuses/Orbits: No mass or acute finding within the imaged orbits. Prior but ocular lens replacement. 5 mm mucous retention cyst within the left maxillary sinus. IMPRESSION: 1.  No evidence of an acute intracranial abnormality. 2. Stable non-contrast MRI appearance of the brain as compared to 01/07/2021. 3. Small focus of chronic encephalomalacia/gliosis within the inferior right temporal lobe, which may reflect a chronic infarct, or may be posttraumatic in etiology. 4. 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 5. Mild chronic small vessel ischemic changes within the cerebral white matter and pons. 6. Redemonstrated nonspecific gliosis within the ventral aspect of the medulla. 7. 5 mm left maxillary sinus mucous retention cyst. Electronically Signed   By: Jackey Loge D.O.   On: 06/12/2022 15:01      IMPRESSION AND PLAN:  Assessment and Plan: * Recurrent falls - This is likely secondary to her generalized weakness. - PT consult will be obtained to assess her ambulation. - Management otherwise as below.  Hypertensive urgency - The patient will be continued on her antihypertensives. - We will place on as needed IV labetalol.  Acute lower UTI - We will continue IV Rocephin and follow urine culture and sensitivity.  Hypothyroidism - We will continue Synthroid and check TSH level.  Low back pain - Pain management will be  provided.  Anxiety - We will continue BuSpar.       DVT prophylaxis: Lovenox.  Advanced Care Planning:  Code Status: full code.  Family Communication:  The plan of care was discussed in details with the patient (and family). I answered all questions. The patient agreed to proceed with the above mentioned plan. Further management will depend upon hospital course. Disposition Plan: Back to previous  home environment Consults called: none.  All the records are reviewed and case discussed with ED provider.  Status is: Inpatient   At the time of the admission, it appears that the appropriate admission status for this patient is inpatient.  This is judged to be reasonable and necessary in order to provide the required intensity of service to ensure the patient's safety given the presenting symptoms, physical exam findings and initial radiographic and laboratory data in the context of comorbid conditions.  The patient requires inpatient status due to high intensity of service, high risk of further deterioration and high frequency of surveillance required.  I certify that at the time of admission, it is my clinical judgment that the patient will require inpatient hospital care extending more than 2 midnights.                            Dispo: The patient is from: Home              Anticipated d/c is to: Home              Patient currently is not medically stable to d/c.              Difficult to place patient: No  Hannah Beat M.D on 06/13/2022 at 2:45 AM  [facility]   From 7 PM-7 AM, contact night-coverage [email or web address]  CC: Primary care physician; Anabel Halon, MD

## 2022-06-13 DIAGNOSIS — R296 Repeated falls: Secondary | ICD-10-CM | POA: Diagnosis not present

## 2022-06-13 DIAGNOSIS — N390 Urinary tract infection, site not specified: Secondary | ICD-10-CM

## 2022-06-13 DIAGNOSIS — M545 Low back pain, unspecified: Secondary | ICD-10-CM

## 2022-06-13 DIAGNOSIS — F419 Anxiety disorder, unspecified: Secondary | ICD-10-CM | POA: Insufficient documentation

## 2022-06-13 DIAGNOSIS — I160 Hypertensive urgency: Secondary | ICD-10-CM

## 2022-06-13 LAB — BASIC METABOLIC PANEL
Anion gap: 9 (ref 5–15)
BUN: 18 mg/dL (ref 8–23)
CO2: 22 mmol/L (ref 22–32)
Calcium: 8.5 mg/dL — ABNORMAL LOW (ref 8.9–10.3)
Chloride: 105 mmol/L (ref 98–111)
Creatinine, Ser: 0.68 mg/dL (ref 0.44–1.00)
GFR, Estimated: >60 mL/min (ref >60)
Glucose, Bld: 119 mg/dL — ABNORMAL HIGH (ref 70–99)
Potassium: 3.3 mmol/L — ABNORMAL LOW (ref 3.5–5.1)
Sodium: 136 mmol/L (ref 135–145)

## 2022-06-13 LAB — CBC
HCT: 36.2 % (ref 36.0–46.0)
Hemoglobin: 12 g/dL (ref 12.0–15.0)
MCH: 30.5 pg (ref 26.0–34.0)
MCHC: 33.1 g/dL (ref 30.0–36.0)
MCV: 92.1 fL (ref 80.0–100.0)
Platelets: 298 10*3/uL (ref 150–400)
RBC: 3.93 MIL/uL (ref 3.87–5.11)
RDW: 12.8 % (ref 11.5–15.5)
WBC: 8.6 10*3/uL (ref 4.0–10.5)
nRBC: 0 % (ref 0.0–0.2)

## 2022-06-13 LAB — TSH: TSH: 2.666 u[IU]/mL (ref 0.350–4.500)

## 2022-06-13 MED ORDER — POTASSIUM CHLORIDE CRYS ER 20 MEQ PO TBCR
40.0000 meq | EXTENDED_RELEASE_TABLET | Freq: Once | ORAL | Status: AC
  Administered 2022-06-13: 40 meq via ORAL
  Filled 2022-06-13: qty 2

## 2022-06-13 MED ORDER — POLYETHYLENE GLYCOL 3350 17 G PO PACK: 17.0000 g | PACK | Freq: Every day | ORAL | Status: DC | PRN

## 2022-06-13 MED ORDER — OXYCODONE HCL 5 MG PO TABS
5.0000 mg | ORAL_TABLET | Freq: Four times a day (QID) | ORAL | Status: AC | PRN
  Administered 2022-06-13 – 2022-06-14 (×4): 5 mg via ORAL
  Filled 2022-06-13 (×4): qty 1

## 2022-06-13 MED ORDER — HYDROMORPHONE HCL 1 MG/ML IJ SOLN
0.5000 mg | INTRAMUSCULAR | Status: DC | PRN
  Administered 2022-06-14: 0.5 mg via INTRAVENOUS
  Filled 2022-06-13: qty 0.5

## 2022-06-13 MED ORDER — PROCHLORPERAZINE EDISYLATE 10 MG/2ML IJ SOLN: 5.0000 mg | Freq: Four times a day (QID) | INTRAMUSCULAR | Status: DC | PRN

## 2022-06-13 NOTE — Assessment & Plan Note (Signed)
-   We will continue Synthroid and check TSH level 

## 2022-06-13 NOTE — Progress Notes (Addendum)
PROGRESS NOTE  Nancy Schroeder HKV:[id number] DOB: 1933/03/30 DOA: 06/12/2022 PCP: Anabel Halon, MD  HPI/Recap of past 24 hours: history significant for anxiety, depression, essential hypertension, who presented to the emergency room with a current onset of recurrent falls secondary to generalized lower extremity weakness.  She fell on her deck yesterday and fell in the shower on the day of admission. She thinks she may have hit her head but does not have any head pain now and denies any neck pain.   The patient was noted to have fractures to her third and fourth right toes.  UA was positive for pyuria, was started on Rocephin for UTI.  06/13/2022: The patient was seen and examined at bedside.  States she feels weak.     Assessment/Plan: Principal Problem:   Recurrent falls Active Problems:   Hypertensive urgency   Hypothyroidism   Acute lower UTI   Low back pain   Anxiety  Recurrent falls Third and fourth toes fractures, right foot Continue fall precautions Ortho consulted to provide recommendations Discussed with Dr. Charlann Boxer, recommended cast shoe and weight bear as tolerated with right cast shoe.  This can be placed by ortho tech.  Follow up with ortho after discharge, with Dr. Odis Hollingshead.   Hypertensive urgency Continue home oral antihypertensive Continue to closely monitor vital signs   UTI, POA Continue Rocephin until urine culture results for ID and sensitivities, currently in process   Hypothyroidism Continue home Synthroid  Low back pain Analgesics as needed   Anxiety Continue home BuSpar   Code Status: DNR  Family Communication: None at bedside  Disposition Plan: Admitted to MedSurg unit   Consultants: Orthopedic surgery  Procedures: None  Antimicrobials: Rocephin  DVT prophylaxis: Subcu Lovenox daily  Status is: Inpatient Inpatient status.  The patient requires at least 2 midnights for further evaluation and treatment of present  condition.    Objective: Vitals:   06/12/22 2052 06/13/22 0544 06/13/22 1623 06/13/22 1627  BP:  (!) 169/57 (!) 150/126 (!) 162/62  Pulse:  76 78 78  Resp:  16 18   Temp: 98 F (36.7 C) 98.4 F (36.9 C) 98 F (36.7 C)   TempSrc: Oral Oral    SpO2:  95% 100%   Weight:      Height:        Intake/Output Summary (Last 24 hours) at 06/13/2022 1718 Last data filed at 06/13/2022 0500 Gross per 24 hour  Intake 518.41 ml  Output --  Net 518.41 ml   Filed Weights   06/12/22 1125  Weight: 79.8 kg    Exam:  General: 89 y.o. year-old female well developed well nourished in no acute distress.  Alert and oriented x3. Cardiovascular: Regular rate and rhythm with no rubs or gallops.  No thyromegaly or JVD noted.   Respiratory: Clear to auscultation with no wheezes or rales. Good inspiratory effort. Abdomen: Soft nontender nondistended with normal bowel sounds x4 quadrants. Musculoskeletal: No lower extremity edema. 2/4 pulses in all 4 extremities. Skin: No ulcerative lesions noted or rashes, Psychiatry: Mood is appropriate for condition and setting   Data Reviewed: CBC: Recent Labs  Lab 06/12/22 1135 06/13/22 0638  WBC 10.3 8.6  HGB 14.3 12.0  HCT 44.2 36.2  MCV 94.4 92.1  PLT 372 298   Basic Metabolic Panel: Recent Labs  Lab 06/12/22 1135 06/13/22 0638  NA 140 136  K 4.1 3.3*  CL 104 105  CO2 22 22  GLUCOSE 123* 119*  BUN 18 18  CREATININE 0.72 0.68  CALCIUM 9.5 8.5*  MG 2.1  --    GFR: Estimated Creatinine Clearance: 47.2 mL/min (by C-G formula based on SCr of 0.68 mg/dL). Liver Function Tests: No results for input(s): "AST", "ALT", "ALKPHOS", "BILITOT", "PROT", "ALBUMIN" in the last 168 hours. No results for input(s): "LIPASE", "AMYLASE" in the last 168 hours. No results for input(s): "AMMONIA" in the last 168 hours. Coagulation Profile: No results for input(s): "INR", "PROTIME" in the last 168 hours. Cardiac Enzymes: Recent Labs  Lab 06/12/22 1135   CKTOTAL 71   BNP (last 3 results) No results for input(s): "PROBNP" in the last 8760 hours. HbA1C: No results for input(s): "HGBA1C" in the last 72 hours. CBG: No results for input(s): "GLUCAP" in the last 168 hours. Lipid Profile: No results for input(s): "CHOL", "HDL", "LDLCALC", "TRIG", "CHOLHDL", "LDLDIRECT" in the last 72 hours. Thyroid Function Tests: Recent Labs    06/13/22 0638  TSH 2.666   Anemia Panel: No results for input(s): "VITAMINB12", "FOLATE", "FERRITIN", "TIBC", "IRON", "RETICCTPCT" in the last 72 hours. Urine analysis:    Component Value Date/Time   COLORURINE AMBER (A) 06/12/2022 1609   APPEARANCEUR CLOUDY (A) 06/12/2022 1609   LABSPEC 1.018 06/12/2022 1609   PHURINE 6.0 06/12/2022 1609   GLUCOSEU NEGATIVE 06/12/2022 1609   HGBUR NEGATIVE 06/12/2022 1609   BILIRUBINUR NEGATIVE 06/12/2022 1609   KETONESUR 5 (A) 06/12/2022 1609   PROTEINUR NEGATIVE 06/12/2022 1609   NITRITE NEGATIVE 06/12/2022 1609   LEUKOCYTESUR MODERATE (A) 06/12/2022 1609   Sepsis Labs: @LABRCNTIP (procalcitonin:4,lacticidven:4)  )No results found for this or any previous visit (from the past 240 hour(s)).    Studies: DG Foot Complete Right  Result Date: 06/12/2022 CLINICAL DATA:  Pain and difficulty walking status post fall. EXAM: RIGHT FOOT COMPLETE - 3+ VIEW COMPARISON:  None Available. FINDINGS: Acute mildly displaced oblique fracture of the proximal shaft of the third toe proximal phalanx. The fracture does not extend to the articular surface. The distal fracture fragment is displaced laterally approximately 2 mm. No significant impaction or angulation. Acute minimally displaced intra-articular fracture of the medial base of the fourth toe proximal phalanx. The distal fracture fragment is displaced approximately 1 mm laterally. No significant angulation. The fourth toe MTP joint space is intact. No other acute osseous abnormality. 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  Tiny posterior and small plantar calcaneal enthesophytes. Diffuse osteopenia. Vascular calcifications noted at the level of the distal right lower leg and within the right midfoot. IMPRESSION: 1. Acute mildly displaced oblique fracture of the proximal shaft of the third toe proximal phalanx. 2. Acute minimally displaced intra-articular fracture of the medial base of the fourth toe proximal phalanx. Electronically Signed   By: Sherron Ales M.D.   On: 06/12/2022 18:07   DG Pelvis 1-2 Views  Result Date: 06/12/2022 CLINICAL DATA:  Pain after fall EXAM: PELVIS - 1-2 VIEW COMPARISON:  None Available. FINDINGS: There is no evidence of pelvic fracture or diastasis. No pelvic bone lesions are seen. IMPRESSION: Negative. Electronically Signed   By: Gerome Sam III M.D.   On: 06/12/2022 18:00    Scheduled Meds:  amLODipine  5 mg Oral Daily   aspirin EC  81 mg Oral Daily   carbamazepine  200 mg Oral BID   DULoxetine  20 mg Oral Daily   enoxaparin (LOVENOX) injection  40 mg Subcutaneous Q24H   hydrochlorothiazide  12.5 mg Oral Daily   levothyroxine  25 mcg Oral Q0600   lisinopril  40 mg Oral Daily   psyllium  2 packet Oral BID    Continuous Infusions:  sodium chloride 50 mL/hr at 06/12/22 2301   cefTRIAXone (ROCEPHIN)  IV 1 g (06/13/22 1137)     LOS: 1 day     Darlin Drop, MD [facility] Pager [phone]  If 7PM-7AM, please contact night-coverage [email or web address] Password [facility] 06/13/2022, 5:18 PM

## 2022-06-13 NOTE — Progress Notes (Signed)
PT Cancellation Note  Patient Details Name: Nancy Schroeder MRN: [id number] DOB: Jan 11, 1934   Cancelled Treatment:    Reason Eval/Treat Not Completed: Patient not medically ready. Pt was found to have R toe fractures, per discussion with attending, hold PT until plan established. Will follow up as appropriate/available. Thank you.    Leonie Man 06/13/2022, 8:53 AM

## 2022-06-13 NOTE — Assessment & Plan Note (Signed)
-   Pain management will be provided. 

## 2022-06-13 NOTE — Assessment & Plan Note (Signed)
-   We will continue BuSpar. ?

## 2022-06-13 NOTE — Assessment & Plan Note (Signed)
-   We will continue IV Rocephin and follow urine culture and sensitivity. ?

## 2022-06-13 NOTE — Assessment & Plan Note (Signed)
-   The patient will be continued on her antihypertensives. - We will place on as needed IV labetalol.

## 2022-06-13 NOTE — Assessment & Plan Note (Signed)
-   This is likely secondary to her generalized weakness. - PT consult will be obtained to assess her ambulation. - Management otherwise as below.

## 2022-06-14 DIAGNOSIS — R296 Repeated falls: Secondary | ICD-10-CM | POA: Diagnosis not present

## 2022-06-14 LAB — C-REACTIVE PROTEIN: CRP: 1.4 mg/dL — ABNORMAL HIGH (ref <1.0)

## 2022-06-14 LAB — BASIC METABOLIC PANEL
Anion gap: 4 — ABNORMAL LOW (ref 5–15)
BUN: 19 mg/dL (ref 8–23)
CO2: 23 mmol/L (ref 22–32)
Calcium: 8.5 mg/dL — ABNORMAL LOW (ref 8.9–10.3)
Chloride: 110 mmol/L (ref 98–111)
Creatinine, Ser: 0.66 mg/dL (ref 0.44–1.00)
GFR, Estimated: >60 mL/min (ref >60)
Glucose, Bld: 111 mg/dL — ABNORMAL HIGH (ref 70–99)
Potassium: 4.3 mmol/L (ref 3.5–5.1)
Sodium: 137 mmol/L (ref 135–145)

## 2022-06-14 LAB — CK: Total CK: 54 U/L (ref 38–234)

## 2022-06-14 LAB — SEDIMENTATION RATE: Sed Rate: 29 mm/hr — ABNORMAL HIGH (ref 0–22)

## 2022-06-14 LAB — CBC
HCT: 37 % (ref 36.0–46.0)
Hemoglobin: 12 g/dL (ref 12.0–15.0)
MCH: 30.8 pg (ref 26.0–34.0)
MCHC: 32.4 g/dL (ref 30.0–36.0)
MCV: 94.9 fL (ref 80.0–100.0)
Platelets: 301 10*3/uL (ref 150–400)
RBC: 3.9 MIL/uL (ref 3.87–5.11)
RDW: 13.1 % (ref 11.5–15.5)
WBC: 10.5 10*3/uL (ref 4.0–10.5)
nRBC: 0 % (ref 0.0–0.2)

## 2022-06-14 LAB — URINE CULTURE

## 2022-06-14 LAB — MAGNESIUM: Magnesium: 2.2 mg/dL (ref 1.7–2.4)

## 2022-06-14 MED ORDER — AMLODIPINE BESYLATE 10 MG PO TABS
10.0000 mg | ORAL_TABLET | Freq: Every day | ORAL | Status: DC
  Administered 2022-06-15 – 2022-06-16 (×2): 10 mg via ORAL
  Filled 2022-06-14 (×2): qty 1

## 2022-06-14 MED ORDER — AMLODIPINE BESYLATE 5 MG PO TABS
5.0000 mg | ORAL_TABLET | Freq: Once | ORAL | Status: AC
  Administered 2022-06-14: 5 mg via ORAL
  Filled 2022-06-14: qty 1

## 2022-06-14 NOTE — Progress Notes (Signed)
Orthopedic Tech Progress Note Patient Details:  Nancy Schroeder 09-May-1933 [id number]  Fitted patient with a POST OP SHOE this morning    Ortho Devices Type of Ortho Device: Postop shoe/boot Ortho Device/Splint Location: RLE Ortho Device/Splint Interventions: Ordered, Application, Adjustment, Removal   Post Interventions Patient Tolerated: Well Instructions Provided: Care of device  Donald Pore 06/14/2022, 9:34 AM

## 2022-06-14 NOTE — Plan of Care (Signed)
Patient AOX2, disoriented to full date and situation.  Forgetful at baseline.  All meds given on time as ordered. Pt denied pain and SOB.  Diminished lungs, IS encouraged.  Pt standby assist to [facility].  POC maintained, will continue to monitor.  Problem: Education: Goal: Knowledge of General Education information will improve Description: Including pain rating scale, medication(s)/side effects and non-pharmacologic comfort measures Outcome: Progressing   Problem: Health Behavior/Discharge Planning: Goal: Ability to manage health-related needs will improve Outcome: Progressing   Problem: Clinical Measurements: Goal: Ability to maintain clinical measurements within normal limits will improve Outcome: Progressing Goal: Will remain free from infection Outcome: Progressing Goal: Diagnostic test results will improve Outcome: Progressing Goal: Respiratory complications will improve Outcome: Progressing Goal: Cardiovascular complication will be avoided Outcome: Progressing   Problem: Activity: Goal: Risk for activity intolerance will decrease Outcome: Progressing   Problem: Nutrition: Goal: Adequate nutrition will be maintained Outcome: Progressing   Problem: Coping: Goal: Level of anxiety will decrease Outcome: Progressing   Problem: Elimination: Goal: Will not experience complications related to bowel motility Outcome: Progressing Goal: Will not experience complications related to urinary retention Outcome: Progressing   Problem: Pain Managment: Goal: General experience of comfort will improve Outcome: Progressing   Problem: Safety: Goal: Ability to remain free from injury will improve Outcome: Progressing   Problem: Skin Integrity: Goal: Risk for impaired skin integrity will decrease Outcome: Progressing

## 2022-06-14 NOTE — Progress Notes (Signed)
PROGRESS NOTE  Nancy Schroeder UYQ:[id number] DOB: 1933-10-04 DOA: 06/12/2022 PCP: Anabel Halon, MD  HPI/Recap of past 24 hours: history significant for anxiety, depression, essential hypertension, who presented to the emergency room with a current onset of recurrent falls secondary to generalized lower extremity weakness.  She fell on her deck yesterday and fell in the shower on the day of admission. She thinks she may have hit her head but does not have any head pain now and denies any neck pain.   The patient was noted to have fractures to her third and fourth right toes.  UA was positive for pyuria, was started on Rocephin for UTI.  06/14/2022: The patient was seen and examined at her bedside.  She feels weak, mainly in her legs, confirmed by PT.  Will obtain inflammatory markers ESR and CRP, CPK to rule out an underlying process.     Assessment/Plan: Principal Problem:   Recurrent falls Active Problems:   Hypertensive urgency   Hypothyroidism   Acute lower UTI   Low back pain   Anxiety  Recurrent falls Third and fourth toes fractures, right foot Continue fall precautions Ortho consulted to provide recommendations Discussed with Dr. Charlann Boxer, recommended cast shoe and weight bear as tolerated with right cast shoe.  This can be placed by ortho tech.  Follow up with ortho after discharge, with Dr. Odis Hollingshead.  Bilateral lower extremity weakness Follow CRP ESR and CPK Continue PT OT Continue fall precautions   Hypertensive urgency Continue home oral antihypertensive Continue to closely monitor vital signs   UTI, POA Continue Rocephin until urine culture results for ID and sensitivities, currently in process   Hypothyroidism Continue home Synthroid  Low back pain Analgesics as needed   Anxiety Continue home BuSpar   Code Status: DNR  Family Communication: None at bedside  Disposition Plan: Admitted to MedSurg unit   Consultants: Orthopedic  surgery  Procedures: None  Antimicrobials: Rocephin  DVT prophylaxis: Subcu Lovenox daily  Status is: Inpatient Inpatient status.  The patient requires at least 2 midnights for further evaluation and treatment of present condition.    Objective: Vitals:   06/13/22 1627 06/13/22 2021 06/14/22 0507 06/14/22 0814  BP: (!) 162/62 (!) 137/53 (!) 145/54 (!) 186/65  Pulse: 78 80 68 76  Resp:  18 16 18   Temp:  98 F (36.7 C) (!) 97.4 F (36.3 C) 99 F (37.2 C)  TempSrc:   Oral Oral  SpO2:  95% 97% 98%  Weight:      Height:        Intake/Output Summary (Last 24 hours) at 06/14/2022 1640 Last data filed at 06/14/2022 1145 Gross per 24 hour  Intake 388.9 ml  Output --  Net 388.9 ml   Filed Weights   06/12/22 1125  Weight: 79.8 kg    Exam:  General: 89 y.o. year-old female well-developed well-nourished in no acute distress. Cardiovascular: Regular rate and rhythm. Respiratory: Clear to auscultation with no wheezes or rales. Good inspiratory effort. Abdomen: Soft nontender nondistended with normal bowel sounds x4 quadrants. Musculoskeletal: No lower extremity edema. 2/4 pulses in all 4 extremities. Skin: No ulcerative lesions noted or rashes, Psychiatry: Mood is appropriate for condition setting   Data Reviewed: CBC: Recent Labs  Lab 06/12/22 1135 06/13/22 0638 06/14/22 0448  WBC 10.3 8.6 10.5  HGB 14.3 12.0 12.0  HCT 44.2 36.2 37.0  MCV 94.4 92.1 94.9  PLT 372 298 301   Basic Metabolic Panel: Recent Labs  Lab 06/12/22 1135  06/13/22 0638 06/14/22 0448  NA 140 136 137  K 4.1 3.3* 4.3  CL 104 105 110  CO2 22 22 23   GLUCOSE 123* 119* 111*  BUN 18 18 19   CREATININE 0.72 0.68 0.66  CALCIUM 9.5 8.5* 8.5*  MG 2.1  --  2.2   GFR: Estimated Creatinine Clearance: 47.2 mL/min (by C-G formula based on SCr of 0.66 mg/dL). Liver Function Tests: No results for input(s): "AST", "ALT", "ALKPHOS", "BILITOT", "PROT", "ALBUMIN" in the last 168 hours. No results  for input(s): "LIPASE", "AMYLASE" in the last 168 hours. No results for input(s): "AMMONIA" in the last 168 hours. Coagulation Profile: No results for input(s): "INR", "PROTIME" in the last 168 hours. Cardiac Enzymes: Recent Labs  Lab 06/12/22 1135  CKTOTAL 71   BNP (last 3 results) No results for input(s): "PROBNP" in the last 8760 hours. HbA1C: No results for input(s): "HGBA1C" in the last 72 hours. CBG: No results for input(s): "GLUCAP" in the last 168 hours. Lipid Profile: No results for input(s): "CHOL", "HDL", "LDLCALC", "TRIG", "CHOLHDL", "LDLDIRECT" in the last 72 hours. Thyroid Function Tests: Recent Labs    06/13/22 0638  TSH 2.666   Anemia Panel: No results for input(s): "VITAMINB12", "FOLATE", "FERRITIN", "TIBC", "IRON", "RETICCTPCT" in the last 72 hours. Urine analysis:    Component Value Date/Time   COLORURINE AMBER (A) 06/12/2022 1609   APPEARANCEUR CLOUDY (A) 06/12/2022 1609   LABSPEC 1.018 06/12/2022 1609   PHURINE 6.0 06/12/2022 1609   GLUCOSEU NEGATIVE 06/12/2022 1609   HGBUR NEGATIVE 06/12/2022 1609   BILIRUBINUR NEGATIVE 06/12/2022 1609   KETONESUR 5 (A) 06/12/2022 1609   PROTEINUR NEGATIVE 06/12/2022 1609   NITRITE NEGATIVE 06/12/2022 1609   LEUKOCYTESUR MODERATE (A) 06/12/2022 1609   Sepsis Labs: @LABRCNTIP (procalcitonin:4,lacticidven:4)  ) Recent Results (from the past 240 hour(s))  Urine Culture (for pregnant, neutropenic or urologic patients or patients with an indwelling urinary catheter)     Status: Abnormal (Preliminary result)   Collection Time: 06/12/22  4:09 PM   Specimen: Urine, Clean Catch  Result Value Ref Range Status   Specimen Description URINE, CLEAN CATCH  Final   Special Requests NONE  Final   Culture (A)  Final    >=100,000 COLONIES/mL ENTEROCOCCUS FAECALIS SUSCEPTIBILITIES TO FOLLOW Performed at [facility], [address] [place], [place] [address]    Report Status PENDING  Incomplete      Studies: No  results found.  Scheduled Meds:  [START ON 06/15/2022] amLODipine  10 mg Oral Daily   aspirin EC  81 mg Oral Daily   carbamazepine  200 mg Oral BID   DULoxetine  20 mg Oral Daily   enoxaparin (LOVENOX) injection  40 mg Subcutaneous Q24H   hydrochlorothiazide  12.5 mg Oral Daily   levothyroxine  25 mcg Oral Q0600   lisinopril  40 mg Oral Daily   psyllium  2 packet Oral BID    Continuous Infusions:  cefTRIAXone (ROCEPHIN)  IV 1 g (06/14/22 1240)     LOS: 2 days     Darlin Drop, MD [facility] Pager [phone]  If 7PM-7AM, please contact night-coverage [email or web address] Password [facility] 06/14/2022, 4:40 PM

## 2022-06-14 NOTE — Progress Notes (Signed)
Received patient at 1:24 pm. Assessments incomplete

## 2022-06-14 NOTE — Evaluation (Signed)
Occupational Therapy Evaluation Patient Details Name: Nancy Schroeder MRN: [id number] DOB: 11/19/1933 Today's Date: 06/14/2022   History of Present Illness Pt is 89 yo female who presents on 06/12/22 after several falls thought to be due to LE weakness. She was found to have R 3rd and 4th toe fractures and possible UTI.  PMH: HTN, anxiety, depression, LBP.   Clinical Impression   Pt reports she lives with son, who provides set up A for some ADLs, uses RW for mobility. Pt currently needing supervision-mod A for ADLs, min A for bed mobility, and min A for step pivot transfer to chair, pt educated on WB precautions and need for postop shoe when OOB, pt verbalized understanding. BP 198/60 (100) HR 72 at end of session, RN aware and in room. Pt presenting with impairments listed below, will follow acutely. Recommend HHOT at d/c.      Recommendations for follow up therapy are one component of a multi-disciplinary discharge planning process, led by the attending physician.  Recommendations may be updated based on patient status, additional functional criteria and insurance authorization.   Assistance Recommended at Discharge Intermittent Supervision/Assistance  Patient can return home with the following A little help with walking and/or transfers;A little help with bathing/dressing/bathroom;Assistance with cooking/housework;Direct supervision/assist for financial management;Assist for transportation;Help with stairs or ramp for entrance;Direct supervision/assist for medications management    Functional Status Assessment  Patient has had a recent decline in their functional status and demonstrates the ability to make significant improvements in function in a reasonable and predictable amount of time.  Equipment Recommendations  BSC/3in1    Recommendations for Other Services PT consult     Precautions / Restrictions Precautions Precautions: Fall Precaution Comments: fell 4x the day she came in  and had falls prior to that too. Cannot get self up Required Braces or Orthoses: Other Brace Other Brace: R post op shoe Restrictions Weight Bearing Restrictions: No      Mobility Bed Mobility Overal bed mobility: Needs Assistance Bed Mobility: Supine to Sit, Sit to Supine     Supine to sit: Min assist          Transfers Overall transfer level: Needs assistance Equipment used: Rolling walker (2 wheels) Transfers: Sit to/from Stand, Bed to chair/wheelchair/BSC Sit to Stand: Min assist     Step pivot transfers: Min assist     General transfer comment: from bed to chair      Balance Overall balance assessment: Needs assistance, History of Falls Sitting-balance support: Feet supported, No upper extremity supported Sitting balance-Leahy Scale: Fair     Standing balance support: No upper extremity supported, Reliant on assistive device for balance Standing balance-Leahy Scale: Poor Standing balance comment: able to perform perineal care in standing with both UE's but unsteady, min guard given                           ADL either performed or assessed with clinical judgement   ADL Overall ADL's : Needs assistance/impaired Eating/Feeding: Supervision/ safety;Sitting   Grooming: Sitting;Minimal assistance   Upper Body Bathing: Minimal assistance;Sitting   Lower Body Bathing: Sitting/lateral leans;Moderate assistance   Upper Body Dressing : Minimal assistance;Sitting   Lower Body Dressing: Moderate assistance;Sitting/lateral leans   Toilet Transfer: Minimal assistance;Stand-pivot;BSC/3in1;Rolling walker (2 wheels)   Toileting- Clothing Manipulation and Hygiene: Minimal assistance       Functional mobility during ADLs: Minimal assistance;Rolling walker (2 wheels)       Vision Baseline Vision/History: 1  Wears glasses Vision Assessment?: No apparent visual deficits Additional Comments: WFL for BADL     Perception Perception Perception Tested?:  No   Praxis Praxis Praxis tested?: Not tested    Pertinent Vitals/Pain Pain Assessment Pain Assessment: Faces Pain Score: 6  Faces Pain Scale: Hurts even more Pain Location: BLE Pain Descriptors / Indicators: Tingling, Sore Pain Intervention(s): Limited activity within patient's tolerance, Monitored during session, Repositioned     Hand Dominance     Extremity/Trunk Assessment Upper Extremity Assessment Upper Extremity Assessment: Generalized weakness   Lower Extremity Assessment Lower Extremity Assessment: Defer to PT evaluation RLE Deficits / Details: hip flex <3/5, hip ext 4/5, knee ext 4/5 RLE Sensation: WNL RLE Coordination: decreased gross motor   Cervical / Trunk Assessment Cervical / Trunk Assessment: Kyphotic   Communication Communication Communication: No difficulties   Cognition Arousal/Alertness: Awake/alert Behavior During Therapy: WFL for tasks assessed/performed Overall Cognitive Status: Impaired/Different from baseline Area of Impairment: Memory                     Memory: Decreased short-term memory         General Comments: has some mild word finding difficulties which she reports has worsened recently, also delayed processing and difficulty relaying recent timeline     General Comments  elevated BP    Exercises     Shoulder Instructions      Home Living Family/patient expects to be discharged to:: Private residence Living Arrangements: Children Available Help at Discharge: Available 24 hours/day Type of Home: House Home Access: Stairs to enter [employer] of Steps: 2   Home Layout: One level     Bathroom Shower/Tub: Producer, television/film/video: Standard     Home Equipment: Agricultural consultant (2 wheels);Shower seat;Cane - single point   Additional Comments: lives with son      Prior Functioning/Environment Prior Level of Function : Needs assist             Mobility Comments: mobility has been  gradually declining last couple of months, pt not using RW consistently, reports she gets dizzy as well as being unable to pick up her feet while walking ADLs Comments: has stopped getting in tub and now uses walkin shower and has a seat. Uses electric cart at store        OT Problem List: Decreased strength;Decreased range of motion;Decreased activity tolerance;Impaired balance (sitting and/or standing);Decreased safety awareness      OT Treatment/Interventions: Self-care/ADL training;Therapeutic exercise;Energy conservation;DME and/or AE instruction;Therapeutic activities;Patient/family education;Balance training    OT Goals(Current goals can be found in the care plan section) Acute Rehab OT Goals Patient Stated Goal: none stated OT Goal Formulation: With patient Time For Goal Achievement: 06/28/22 Potential to Achieve Goals: Good ADL Goals Pt Will Perform Upper Body Dressing: with supervision;sitting Pt Will Perform Lower Body Dressing: with supervision;sitting/lateral leans;sit to/from stand Pt Will Transfer to Toilet: with supervision;ambulating;regular height toilet Pt Will Perform Tub/Shower Transfer: with supervision;ambulating;Tub transfer;Shower transfer;shower seat  OT Frequency: Min 1X/week    Co-evaluation              AM-PAC OT "6 Clicks" Daily Activity     Outcome Measure Help from another person eating meals?: None Help from another person taking care of personal grooming?: A Little Help from another person toileting, which includes using toliet, bedpan, or urinal?: A Little Help from another person bathing (including washing, rinsing, drying)?: A Lot Help from another person to put on and taking  off regular upper body clothing?: A Little Help from another person to put on and taking off regular lower body clothing?: A Lot 6 Click Score: 17   End of Session Equipment Utilized During Treatment: Rolling walker (2 wheels) Nurse Communication: Mobility  status  Activity Tolerance: Patient tolerated treatment well Patient left: in chair;with call bell/phone within reach;with chair alarm set  OT Visit Diagnosis: Unsteadiness on feet (R26.81);Other abnormalities of gait and mobility (R26.89);Muscle weakness (generalized) (M62.81)                Time: 1610-9604 OT Time Calculation (min): 21 min Charges:  OT General Charges $OT Visit: 1 Visit OT Evaluation $OT Eval Moderate Complexity: 1 Mod  Kiandra Sanguinetti K, OTD, OTR/L SecureChat Preferred Acute Rehab [phone]   Carver Fila Koonce 06/14/2022, 4:12 PM

## 2022-06-14 NOTE — Evaluation (Signed)
Physical Therapy Evaluation Patient Details Name: Nancy Schroeder MRN: [id number] DOB: [birth year] Today's Date: 06/14/2022  History of Present Illness  Pt is 89 yo female who presents on 06/12/22 after several falls thought to be due to LE weakness. She was found to have L 3rd and 4th toe fractures and possible UTI.  PMH: HTN, anxiety, depression, LBP.  Clinical Impression  Pt admitted with above diagnosis. Pt from home with her son where she reports she has had increased number of falls, weakness, light headedness at times and head pain that is on R sided face with tingling into hairline. Pt needed min A to come to EOB and pivot to [facility]. Pt able to perform perineal care with close guarding. With ambulation pt able to walk 15' before needing mod A due to LE weakness and inability to fully clear feet from floor. Pt reports she was not consistently using RW at home, instructed that she needs it every time she is up. Would not recommend d/c home until she has had more acute therapy and recommend HHPT for balance program.  Pt currently with functional limitations due to the deficits listed below (see PT Problem List). Pt will benefit from acute skilled PT to increase their independence and safety with mobility to allow discharge.     BP supine 197/57  HR 79 bpm         Sitting 201/73          80 bpm        Standing 0 min   197/71     86 bpm       Standing 3 mins    193/67   83 bpm     Recommendations for follow up therapy are one component of a multi-disciplinary discharge planning process, led by the attending physician.  Recommendations may be updated based on patient status, additional functional criteria and insurance authorization.  Follow Up Recommendations       Assistance Recommended at Discharge Frequent or constant Supervision/Assistance  Patient can return home with the following  A lot of help with walking and/or transfers;Help with stairs or ramp for entrance;Assist for  transportation;Direct supervision/assist for financial management;Direct supervision/assist for medications management;Assistance with cooking/housework;A little help with bathing/dressing/bathroom    Equipment Recommendations None recommended by PT  Recommendations for Other Services  OT consult    Functional Status Assessment Patient has had a recent decline in their functional status and demonstrates the ability to make significant improvements in function in a reasonable and predictable amount of time.     Precautions / Restrictions Precautions Precautions: Fall Precaution Comments: fell 4x the day she came in and had falls prior to that too. Cannot get self up Required Braces or Orthoses: Other Brace Other Brace: R post op shoe Restrictions Weight Bearing Restrictions: No      Mobility  Bed Mobility Overal bed mobility: Needs Assistance Bed Mobility: Supine to Sit, Sit to Supine     Supine to sit: Min assist Sit to supine: Min guard   General bed mobility comments: pt attempted to sit straight up in bed and was unable. Cued to roll to R and was able to rise with heavy use of rail and min A on LLE to stabilize. Pt able to return to supine with min guard    Transfers Overall transfer level: Needs assistance Equipment used: Rolling walker (2 wheels) Transfers: Sit to/from Stand, Bed to chair/wheelchair/BSC Sit to Stand: Min assist   Step pivot transfers: Min assist  General transfer comment: pivoted to [facility] and back to bed with min A    Ambulation/Gait Ambulation/Gait assistance: Min assist, Mod assist Gait Distance (Feet): 30 Feet Assistive device: Rolling walker (2 wheels) Gait Pattern/deviations: Step-through pattern, Shuffle Gait velocity: decreased Gait velocity interpretation: <1.31 ft/sec, indicative of household ambulator   General Gait Details: was able to pick feet up for 15' then shuffle last 15' and pt reports LE's beginning to give way, mod A  given last 10'  Stairs            Wheelchair Mobility    Modified Rankin (Stroke Patients Only)       Balance Overall balance assessment: Needs assistance, History of Falls Sitting-balance support: Feet supported, No upper extremity supported Sitting balance-Leahy Scale: Fair     Standing balance support: No upper extremity supported, Reliant on assistive device for balance Standing balance-Leahy Scale: Poor Standing balance comment: able to perform perineal care in standing with both UE's but unsteady, min guard given                             Pertinent Vitals/Pain Pain Assessment Pain Assessment: Faces Faces Pain Scale: Hurts little more Pain Location: R toes and hip as well as some head pain Pain Descriptors / Indicators: Tingling, Sore Pain Intervention(s): Limited activity within patient's tolerance, Monitored during session    Home Living Family/patient expects to be discharged to:: Private residence Living Arrangements: Children Available Help at Discharge: Available 24 hours/day Type of Home: House Home Access: Stairs to enter   [employer] of Steps: 2   Home Layout: One level Home Equipment: Agricultural consultant (2 wheels);Shower seat;Cane - single point Additional Comments: lives with son    Prior Function Prior Level of Function : Needs assist             Mobility Comments: mobility has been gradually declining last couple of months, pt not using RW consistently, reports she gets dizzy as well as being unable to pick up her feet while walking ADLs Comments: has stopped getting in tub and now uses walkin shower and has a seat. Uses electric cart at store     Hand Dominance        Extremity/Trunk Assessment   Upper Extremity Assessment Upper Extremity Assessment: Defer to OT evaluation    Lower Extremity Assessment Lower Extremity Assessment: Generalized weakness;RLE deficits/detail RLE Deficits / Details: hip flex  <3/5, hip ext 4/5, knee ext 4/5 RLE Sensation: WNL RLE Coordination: decreased gross motor    Cervical / Trunk Assessment Cervical / Trunk Assessment: Kyphotic  Communication   Communication: Expressive difficulties (mild word finding difficulties)  Cognition Arousal/Alertness: Awake/alert Behavior During Therapy: WFL for tasks assessed/performed Overall Cognitive Status: Impaired/Different from baseline Area of Impairment: Memory                     Memory: Decreased short-term memory         General Comments: has some mild word finding difficulties which she reports has worsened recently, also delayed processing and difficulty relaying recent timeline        General Comments General comments (skin integrity, edema, etc.): elevated BP    Exercises     Assessment/Plan    PT Assessment Patient needs continued PT services  PT Problem List Decreased strength;Decreased activity tolerance;Decreased balance;Decreased mobility;Decreased coordination;Decreased cognition;Decreased knowledge of use of DME;Decreased safety awareness;Decreased knowledge of precautions;Pain;Obesity;Cardiopulmonary status limiting activity  PT Treatment Interventions DME instruction;Gait training;Stair training;Functional mobility training;Therapeutic activities;Therapeutic exercise;Balance training;Neuromuscular re-education;Cognitive remediation;Patient/family education    PT Goals (Current goals can be found in the Care Plan section)  Acute Rehab PT Goals Patient Stated Goal: return home PT Goal Formulation: With patient Time For Goal Achievement: 06/28/22 Potential to Achieve Goals: Good    Frequency Min 3X/week     Co-evaluation               AM-PAC PT "6 Clicks" Mobility  Outcome Measure Help needed turning from your back to your side while in a flat bed without using bedrails?: A Little Help needed moving from lying on your back to sitting on the side of a flat bed  without using bedrails?: A Little Help needed moving to and from a bed to a chair (including a wheelchair)?: A Little Help needed standing up from a chair using your arms (e.g., wheelchair or bedside chair)?: A Little Help needed to walk in hospital room?: A Lot Help needed climbing 3-5 steps with a railing? : A Lot 6 Click Score: 16    End of Session Equipment Utilized During Treatment: Gait belt Activity Tolerance: Patient tolerated treatment well Patient left: in bed;with call bell/phone within reach;with bed alarm set Nurse Communication: Mobility status;Other (comment) (vitals) PT Visit Diagnosis: Unsteadiness on feet (R26.81);History of falling (Z91.81);Difficulty in walking, not elsewhere classified (R26.2);Pain Pain - Right/Left: Right Pain - part of body: Ankle and joints of foot;Hip    Time: 1610-9604 PT Time Calculation (min) (ACUTE ONLY): 38 min   Charges:   PT Evaluation $PT Eval Moderate Complexity: 1 Mod PT Treatments $Gait Training: 8-22 mins $Therapeutic Activity: 8-22 mins        Lyanne Co, PT  Acute Rehab Services Secure chat preferred Office [phone]   Lawana Chambers Lidia Clavijo 06/14/2022, 3:46 PM

## 2022-06-14 NOTE — TOC Initial Note (Signed)
Transition of Care [facility]) - Initial/Assessment Note    Patient Details  Name: Nancy Schroeder MRN: [id number] Date of Birth: April 05, 1933  Transition of [facility]) CM/SW Contact:    Gordy Clement, RN Phone Number: 06/14/2022, 2:21 PM  Clinical Narrative:     Patient presents from home after having 2 falls in 2 days. Generalized weakness and a UTI.  Also notes are the 3rd and 4th Right Toe fractures - She was fitted with a p[ost op shoe. PT and OT will eval and TOC will follow for recs.                      Patient Goals and CMS Choice            Expected Discharge Plan and Services                                              Prior Living Arrangements/Services                       Activities of Daily Living Home Assistive Devices/Equipment: Environmental consultant (specify type), Cane (specify quad or straight), Other (Comment) (scooter) ADL Screening (condition at time of admission) Patient's cognitive ability adequate to safely complete daily activities?: Yes Is the patient deaf or have difficulty hearing?: No Does the patient have difficulty seeing, even when wearing glasses/contacts?: No Does the patient have difficulty concentrating, remembering, or making decisions?: Yes Patient able to express need for assistance with ADLs?: Yes Does the patient have difficulty dressing or bathing?: Yes Independently performs ADLs?: Yes (appropriate for developmental age) Does the patient have difficulty walking or climbing stairs?: Yes Weakness of Legs: Both Weakness of Arms/Hands: None  Permission Sought/Granted                  Emotional Assessment              Admission diagnosis:  Acute cystitis without hematuria [N30.00] Recurrent falls [R29.6] Acute bilateral low back pain with bilateral sciatica [M54.42, M54.41] Patient Active Problem List   Diagnosis Date Noted   Acute lower UTI 06/13/2022   Hypertensive urgency 06/13/2022   Anxiety 06/13/2022    Low back pain 06/13/2022   Recurrent falls 06/12/2022   Chronic pain 06/09/2022   Idiopathic peripheral neuropathy 04/07/2022   Vitamin D deficiency 02/25/2022   GAD (generalized anxiety disorder) 02/25/2022   Cervical radiculopathy 02/25/2022   Lumbar radiculopathy 05/07/2021   Contact dermatitis 05/07/2021   Atypical mole 05/07/2021   Encounter for general adult medical examination with abnormal findings 10/01/2020   Trigeminal neuralgia pain 10/01/2020   Pulmonary nodule 04/29/2020   Fatigue 04/29/2020   Exertional dyspnea 04/21/2020   Hypothyroidism 04/21/2020   Forgetfulness 02/06/2019   Acute midline thoracic back pain 02/06/2019   Obesity (BMI 30.0-34.9) 02/06/2019   Pain due to onychomycosis of toenails of both feet 09/01/2018   Essential hypertension 02/20/2007   PCP:  Anabel Halon, MD Pharmacy:   [facility] [phone] - [place], [place] - [address] AT [facility] [address] [place] [address] Phone: [phone] Fax: [phone]  [id number] - [place], [place] - [address] AT [facility] OF [address] [place] [address] Phone: [phone] Fax: [phone]  Social Determinants of Health (SDOH) Social History: SDOH Screenings   Food Insecurity: No Food Insecurity (06/12/2022)  Housing: Low Risk  (06/12/2022)  Transportation Needs: No Transportation Needs (06/12/2022)  Utilities: Not At Risk (06/12/2022)  Alcohol Screen: Low Risk  (08/29/2020)  Depression (PHQ2-9): Low Risk  (06/09/2022)  Financial Resource Strain: Medium Risk (08/29/2020)  Physical Activity: Inactive (09/14/2021)  Social Connections: Socially Isolated (08/29/2020)  Stress: No Stress Concern Present (08/29/2020)  Tobacco Use: Low Risk  (06/12/2022)   SDOH Interventions:     Readmission Risk Interventions     No data to display

## 2022-06-15 DIAGNOSIS — R296 Repeated falls: Secondary | ICD-10-CM | POA: Diagnosis not present

## 2022-06-15 LAB — URINE CULTURE: Culture: >=100000 — AB

## 2022-06-15 MED ORDER — OXYCODONE HCL 5 MG PO TABS
5.0000 mg | ORAL_TABLET | Freq: Four times a day (QID) | ORAL | Status: DC | PRN
  Administered 2022-06-15: 5 mg via ORAL
  Filled 2022-06-15: qty 1

## 2022-06-15 MED ORDER — FAMOTIDINE IN NACL 20-0.9 MG/50ML-% IV SOLN
20.0000 mg | INTRAVENOUS | Status: AC
  Administered 2022-06-15: 20 mg via INTRAVENOUS
  Filled 2022-06-15: qty 50

## 2022-06-15 MED ORDER — AMOXICILLIN 500 MG PO CAPS
500.0000 mg | ORAL_CAPSULE | Freq: Two times a day (BID) | ORAL | Status: DC
  Filled 2022-06-15: qty 1

## 2022-06-15 MED ORDER — AMLODIPINE BESYLATE 5 MG PO TABS
10.0000 mg | ORAL_TABLET | Freq: Every day | ORAL | 0 refills | Status: DC
Start: 2022-06-15 — End: 2022-06-16

## 2022-06-15 MED ORDER — METHYLPREDNISOLONE SODIUM SUCC 40 MG IJ SOLR
40.0000 mg | INTRAMUSCULAR | Status: AC
  Administered 2022-06-15: 40 mg via INTRAVENOUS
  Filled 2022-06-15: qty 1

## 2022-06-15 MED ORDER — DIPHENHYDRAMINE HCL 50 MG/ML IJ SOLN
INTRAMUSCULAR | Status: AC
  Filled 2022-06-15: qty 1

## 2022-06-15 MED ORDER — OXYCODONE HCL 5 MG PO TABS: 5.0000 mg | ORAL_TABLET | Freq: Four times a day (QID) | ORAL | Status: DC | PRN

## 2022-06-15 MED ORDER — OXYCODONE HCL 5 MG PO TABS: 5.0000 mg | ORAL_TABLET | Freq: Four times a day (QID) | ORAL | 0 refills | Status: DC | PRN

## 2022-06-15 MED ORDER — DIPHENHYDRAMINE HCL 50 MG/ML IJ SOLN
12.5000 mg | INTRAMUSCULAR | Status: AC
  Administered 2022-06-15: 12.5 mg via INTRAVENOUS

## 2022-06-15 NOTE — Progress Notes (Signed)
Was called at bedside by patient's RN due to developing hives and wheezing shortly after receiving amoxicillin, that was started today.  Presented at bedside.  Upon initial interview the patient was confused but redirectable.  Was able to answer questions appropriately after being redirected.  Hives were noted on exam involving her left lower extremity, back and left arm.  The patient received IV Solu-Medrol 40 mg x 1, Pepcid 20 mg x 1 and Benadryl 12.5 mg x 1.  Amoxicillin/Augmentin added to list of allergies.  Mild wheeze appreciated on lung auscultation, this is improved, per bedside RN.  Vital signs are stable.  Placed on 2 L nasal cannula O2 saturation of 88 to 90% on room air.  Able to move all 4 limbs.  No focal neurological deficits on exam.  Will continue to closely monitor.  Time: 15 minutes

## 2022-06-15 NOTE — Care Management Important Message (Signed)
Important Message  Patient Details  Name: Nancy Schroeder MRN: [id number] Date of Birth: September 04, 1933   Medicare Important Message Given:  Yes     Algis Lehenbauer Stefan Church 06/15/2022, 2:54 PM

## 2022-06-15 NOTE — Progress Notes (Signed)
Physical Therapy Treatment Patient Details Name: Nancy Schroeder MRN: [id number] DOB: 23-May-1933 Today's Date: 06/15/2022   History of Present Illness Pt is 89 yo female who presents on 06/12/22 after several falls thought to be due to LE weakness. She was found to have R 3rd and 4th toe fractures and possible UTI.  PMH: HTN, anxiety, depression, LBP.    PT Comments    Pt received in bed, increased pain BLE's today compared to yesterday. Pt describes burning and pain in hips and from knees to feet. Pt able to come to EOB with min guard A and increased time. Pt needed mod A to stand and pivot and was unable to ambulate due to B knees buckling. Discussed mobility at home at length with pt and son and safety concerns. Advised her to remain at transfer level until f/u with outpt neuro to further evaluate LE weakness. Would benefit from having a rollator. Follow    Recommendations for follow up therapy are one component of a multi-disciplinary discharge planning process, led by the attending physician.  Recommendations may be updated based on patient status, additional functional criteria and insurance authorization.  Follow Up Recommendations       Assistance Recommended at Discharge Frequent or constant Supervision/Assistance  Patient can return home with the following A lot of help with walking and/or transfers;Help with stairs or ramp for entrance;Assist for transportation;Direct supervision/assist for financial management;Direct supervision/assist for medications management;Assistance with cooking/housework;A little help with bathing/dressing/bathroom   Equipment Recommendations  Rollator (4 wheels)    Recommendations for Other Services OT consult     Precautions / Restrictions Precautions Precautions: Fall Precaution Comments: fell 4x the day she came in and had falls prior to that too. Cannot get self up Required Braces or Orthoses: Other Brace Other Brace: R post op  shoe Restrictions Weight Bearing Restrictions: Yes     Mobility  Bed Mobility Overal bed mobility: Needs Assistance Bed Mobility: Supine to Sit, Sit to Supine     Supine to sit: Min guard Sit to supine: Min guard   General bed mobility comments: pt able to come to EOB without physical assist, needed vc's and much increased time. Same for return to supine    Transfers Overall transfer level: Needs assistance Equipment used: Rolling walker (2 wheels) Transfers: Sit to/from Stand, Bed to chair/wheelchair/BSC Sit to Stand: Mod assist Stand pivot transfers: Mod assist         General transfer comment: pt had great difficulty standing today and was limited by BLE pain and knees buckling. Mod A needed to prevent fall from bed to rollator and rollator back to bed    Ambulation/Gait               General Gait Details: pt unable to ambulate today due to B knees buckling.   Stairs             Wheelchair Mobility    Modified Rankin (Stroke Patients Only)       Balance Overall balance assessment: Needs assistance, History of Falls Sitting-balance support: Feet supported, No upper extremity supported Sitting balance-Leahy Scale: Fair     Standing balance support: No upper extremity supported, Reliant on assistive device for balance Standing balance-Leahy Scale: Poor Standing balance comment: mod A needed to maintain standing today                            Cognition Arousal/Alertness: Awake/alert Behavior During Therapy: [facility] for  tasks assessed/performed Overall Cognitive Status: Impaired/Different from baseline Area of Impairment: Memory                     Memory: Decreased short-term memory         General Comments: continues to have some word finding difficulties and STM deficits. She is aware of this and verbalizes that it has been getting worse        Exercises      General Comments General comments (skin integrity,  edema, etc.): pt and son educated at length on her fall risk and the need to keep mobility at a transfer level until outpt f/u with neuro. They both voiced understanding. Also discussed getting into home as pt has 3 steps and will not be able to climb these. Son reports that neighbor can help bump her up steps bkwd when they get home. Discussed options for a ramp at those steps and at the 1 step into sunroom that she fell down breaking her toes.      Pertinent Vitals/Pain Pain Assessment Pain Assessment: Faces Faces Pain Scale: Hurts whole lot Pain Location: BLE (hips and knees to feet) Pain Descriptors / Indicators: Tingling, Sore, Burning Pain Intervention(s): Limited activity within patient's tolerance, Monitored during session    Home Living                          Prior Function            PT Goals (current goals can now be found in the care plan section) Acute Rehab PT Goals Patient Stated Goal: return home PT Goal Formulation: With patient Time For Goal Achievement: 06/28/22 Potential to Achieve Goals: Good Progress towards PT goals: Not progressing toward goals - comment (LE pain)    Frequency    Min 3X/week      PT Plan Equipment recommendations need to be updated    Co-evaluation              AM-PAC PT "6 Clicks" Mobility   Outcome Measure  Help needed turning from your back to your side while in a flat bed without using bedrails?: A Little Help needed moving from lying on your back to sitting on the side of a flat bed without using bedrails?: A Little Help needed moving to and from a bed to a chair (including a wheelchair)?: A Little Help needed standing up from a chair using your arms (e.g., wheelchair or bedside chair)?: A Little Help needed to walk in hospital room?: Total Help needed climbing 3-5 steps with a railing? : Total 6 Click Score: 14    End of Session Equipment Utilized During Treatment: Gait belt Activity Tolerance: Patient  limited by pain Patient left: in bed;with call bell/phone within reach;with bed alarm set;with family/visitor present Nurse Communication: Mobility status PT Visit Diagnosis: Unsteadiness on feet (R26.81);History of falling (Z91.81);Difficulty in walking, not elsewhere classified (R26.2);Pain Pain - Right/Left: Right Pain - part of body: Ankle and joints of foot;Hip     Time: 1100-1137 PT Time Calculation (min) (ACUTE ONLY): 37 min  Charges:  $Therapeutic Activity: 23-37 mins                     Lyanne Co, PT  Acute Rehab Services Secure chat preferred Office [phone]    Lawana Chambers Cletis Muma 06/15/2022, 11:49 AM

## 2022-06-15 NOTE — Progress Notes (Signed)
Updated the patient's son, [name] via phone.  All questions answered to the best of my ability.  He was thankful for the call.  States he will be at the hospital tomorrow around 9 AM to pick up his mom.

## 2022-06-15 NOTE — Progress Notes (Signed)
PROGRESS NOTE  Nancy Schroeder ZOX:[id number] DOB: 10/01/1933 DOA: 06/12/2022 PCP: Rica Records, FNP  HPI/Recap of past 24 hours: history significant for anxiety, depression, essential hypertension, who presented to the emergency room with recurrent falls secondary to lower extremity weakness.  She fell on her deck and in the shower on the day of admission.  CT head and neck were nonacute.  On imaging, the patient was noted to have fractures to her third and fourth right toes.  UA was positive for pyuria, urine culture grew Enterococcus faecalis pansensitive.  Currently on Rocephin for Enterococcus faecalis UTI.  Switched to Augmentin on 06/15/2022.  MRI brain revealed the following: 1.  No evidence of an acute intracranial abnormality. 2. Stable non-contrast MRI appearance of the brain as compared to 01/07/2021. 3. Small focus of chronic encephalomalacia/gliosis within the inferior right temporal lobe, which may reflect a chronic infarct, or may be posttraumatic in etiology. 4. 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 5. Mild chronic small vessel ischemic changes within the cerebral white matter and pons. 6. Redemonstrated nonspecific gliosis within the ventral aspect of the medulla. 7. 5 mm left maxillary sinus mucous retention cyst.   06/15/2022: The patient was seen and examined at bedside.  Weak in her legs with concern for going up her home stairs, which delayed her discharge home.  MRI lumbar spine showed degenerative changes.  Worked with PT with recommendation for home health PT OT.     Assessment/Plan: Principal Problem:   Recurrent falls Active Problems:   Hypertensive urgency   Hypothyroidism   Acute lower UTI   Low back pain   Anxiety  Recurrent falls Third and fourth toes fractures, right foot Continue fall precautions Ortho consulted to provide recommendations Discussed with Dr. Charlann Boxer, recommended cast shoe  and weight bear as tolerated with right cast shoe.  This can be placed by ortho tech.  Follow up with ortho after discharge, with Dr. Odis Hollingshead.  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 CPK negative.   Recommend follow-up with neurology or obtain referral from PCP.  Continue PT OT with fall precautions.  Enterococcus faecalis UTI, POA Received Rocephin on admission, switched to Augmentin on 06/15/2022.   Hypertensive urgency Continue home oral antihypertensive Continue to closely monitor vital signs   UTI, POA Continue Rocephin until urine culture results for ID and sensitivities, currently in process   Hypothyroidism Continue home Synthroid  Low back pain Analgesics as needed   Anxiety Continue home BuSpar   Code Status: DNR  Family Communication: None at bedside  Disposition Plan: Likely discharge to home with home health services on 06/16/2022.   Consultants: Orthopedic surgery  Procedures: None  Antimicrobials: Rocephin>> Augmentin  DVT prophylaxis: Subcu Lovenox daily  Status is: Inpatient Inpatient status.  The patient requires at least 2 midnights for further evaluation and treatment of present condition.    Objective: Vitals:   06/14/22 1703 06/14/22 1925 06/15/22 0422 06/15/22 0758  BP: (!) 151/56 (!) 153/51 (!) 150/52 (!) 157/45  Pulse: 62 69 74 71  Resp: 18 16 16 16   Temp: 98.1 F (36.7 C) 98.4 F (36.9 C) 98.2 F (36.8 C) 98.6 F (37 C)  TempSrc:  Oral Oral   SpO2: 92% 94% 95% 97%  Weight:      Height:       No intake or output data in the 24 hours ending  06/15/22 1557  Filed Weights   06/12/22 1125  Weight: 79.8 kg    Exam:  General: 89 y.o. year-old female well-developed well-nourished in no acute distress.  She is alert and interactive.  Pleasant. Cardiovascular: Regular rate and rhythm. Respiratory: Clear to oscitation no  wheezes or rales. Abdomen: Soft nontender nondistended with normal bowel sounds x4 quadrants. Musculoskeletal: No lower extremity edema. 2/4 pulses in all 4 extremities. Skin: No ulcerative lesions noted or rashes, Psychiatry: Mood is appropriate for condition setting.   Data Reviewed: CBC: Recent Labs  Lab 06/12/22 1135 06/13/22 0638 06/14/22 0448  WBC 10.3 8.6 10.5  HGB 14.3 12.0 12.0  HCT 44.2 36.2 37.0  MCV 94.4 92.1 94.9  PLT 372 298 301   Basic Metabolic Panel: Recent Labs  Lab 06/12/22 1135 06/13/22 0638 06/14/22 0448  NA 140 136 137  K 4.1 3.3* 4.3  CL 104 105 110  CO2 22 22 23   GLUCOSE 123* 119* 111*  BUN 18 18 19   CREATININE 0.72 0.68 0.66  CALCIUM 9.5 8.5* 8.5*  MG 2.1  --  2.2   GFR: Estimated Creatinine Clearance: 47.2 mL/min (by C-G formula based on SCr of 0.66 mg/dL). Liver Function Tests: No results for input(s): "AST", "ALT", "ALKPHOS", "BILITOT", "PROT", "ALBUMIN" in the last 168 hours. No results for input(s): "LIPASE", "AMYLASE" in the last 168 hours. No results for input(s): "AMMONIA" in the last 168 hours. Coagulation Profile: No results for input(s): "INR", "PROTIME" in the last 168 hours. Cardiac Enzymes: Recent Labs  Lab 06/12/22 1135 06/14/22 1726  CKTOTAL 71 54   BNP (last 3 results) No results for input(s): "PROBNP" in the last 8760 hours. HbA1C: No results for input(s): "HGBA1C" in the last 72 hours. CBG: No results for input(s): "GLUCAP" in the last 168 hours. Lipid Profile: No results for input(s): "CHOL", "HDL", "LDLCALC", "TRIG", "CHOLHDL", "LDLDIRECT" in the last 72 hours. Thyroid Function Tests: Recent Labs    06/13/22 0638  TSH 2.666   Anemia Panel: No results for input(s): "VITAMINB12", "FOLATE", "FERRITIN", "TIBC", "IRON", "RETICCTPCT" in the last 72 hours. Urine analysis:    Component Value Date/Time   COLORURINE AMBER (A) 06/12/2022 1609   APPEARANCEUR CLOUDY (A) 06/12/2022 1609   LABSPEC 1.018 06/12/2022  1609   PHURINE 6.0 06/12/2022 1609   GLUCOSEU NEGATIVE 06/12/2022 1609   HGBUR NEGATIVE 06/12/2022 1609   BILIRUBINUR NEGATIVE 06/12/2022 1609   KETONESUR 5 (A) 06/12/2022 1609   PROTEINUR NEGATIVE 06/12/2022 1609   NITRITE NEGATIVE 06/12/2022 1609   LEUKOCYTESUR MODERATE (A) 06/12/2022 1609   Sepsis Labs: @LABRCNTIP (procalcitonin:4,lacticidven:4)  ) Recent Results (from the past 240 hour(s))  Urine Culture (for pregnant, neutropenic or urologic patients or patients with an indwelling urinary catheter)     Status: Abnormal   Collection Time: 06/12/22  4:09 PM   Specimen: Urine, Clean Catch  Result Value Ref Range Status   Specimen Description URINE, CLEAN CATCH  Final   Special Requests   Final    NONE Performed at [facility], [address], [place], [place] [address]    Culture >=100,000 COLONIES/mL ENTEROCOCCUS FAECALIS (A)  Final   Report Status 06/15/2022 FINAL  Final   Organism ID, Bacteria ENTEROCOCCUS FAECALIS (A)  Final      Susceptibility   Enterococcus faecalis - MIC*    AMPICILLIN <=2 SENSITIVE Sensitive     NITROFURANTOIN <=16 SENSITIVE Sensitive     VANCOMYCIN 1 SENSITIVE Sensitive     * >=100,000 COLONIES/mL ENTEROCOCCUS FAECALIS  Studies: No results found.  Scheduled Meds:  amLODipine  10 mg Oral Daily   aspirin EC  81 mg Oral Daily   carbamazepine  200 mg Oral BID   DULoxetine  20 mg Oral Daily   enoxaparin (LOVENOX) injection  40 mg Subcutaneous Q24H   hydrochlorothiazide  12.5 mg Oral Daily   levothyroxine  25 mcg Oral Q0600   lisinopril  40 mg Oral Daily   psyllium  2 packet Oral BID    Continuous Infusions:  cefTRIAXone (ROCEPHIN)  IV 1 g (06/15/22 1307)     LOS: 3 days     Darlin Drop, MD [facility] Pager [phone]  If 7PM-7AM, please contact night-coverage [email or web address] Password [facility] 06/15/2022, 3:57 PM

## 2022-06-15 NOTE — TOC Transition Note (Addendum)
Transition of Care [facility]) - CM/SW Discharge Note   Patient Details  Name: Nancy Schroeder MRN: [id number] Date of Birth: February 28, 1933  [facility]) CM/SW Contact:  Harriet Masson, RN Phone Number: 06/15/2022, 1:05 PM   Clinical Narrative:     Patient stable for discharge.  Orders for Home Health and DME.  Choice offered an patient defers to [facility] to find [facility] agency.  Kandee Keen with [facility] accepted referral. Patient is agreeable to use in house provider, [facility], for DME needs. Notified Barbara Cower with [facility] of rollator order.  Son transports to apts.   Address, phone number confirmed. PCP   Final next level of care: Home w Home Health Services ([facility])     Patient Goals and CMS Choice    Return home  Discharge Placement    home                     Discharge Plan and Services Additional resources added to the After Visit Summary for                  DME Arranged: Walker rolling DME Agency: [facility] Date DME Agency Contacted: 06/15/22 Time DME Agency Contacted: 1303 Representative spoke with at DME Agency: Barbara Cower            Social Determinants of Health (SDOH) Interventions SDOH Screenings   Food Insecurity: No Food Insecurity (06/12/2022)  Housing: Low Risk  (06/12/2022)  Transportation Needs: No Transportation Needs (06/12/2022)  Utilities: Not At Risk (06/12/2022)  Alcohol Screen: Low Risk  (08/29/2020)  Depression (PHQ2-9): Low Risk  (06/09/2022)  Financial Resource Strain: Medium Risk (08/29/2020)  Physical Activity: Inactive (09/14/2021)  Social Connections: Socially Isolated (08/29/2020)  Stress: No Stress Concern Present (08/29/2020)  Tobacco Use: Low Risk  (06/12/2022)     Readmission Risk Interventions    06/15/2022    1:04 PM  Readmission Risk Prevention Plan  Post Dischage Appt Complete  Medication Screening Complete  Transportation Screening Complete

## 2022-06-16 DIAGNOSIS — M7918 Myalgia, other site: Secondary | ICD-10-CM

## 2022-06-16 DIAGNOSIS — M5442 Lumbago with sciatica, left side: Secondary | ICD-10-CM | POA: Diagnosis not present

## 2022-06-16 DIAGNOSIS — N3000 Acute cystitis without hematuria: Secondary | ICD-10-CM | POA: Diagnosis not present

## 2022-06-16 DIAGNOSIS — R296 Repeated falls: Secondary | ICD-10-CM | POA: Diagnosis not present

## 2022-06-16 DIAGNOSIS — I10 Essential (primary) hypertension: Secondary | ICD-10-CM

## 2022-06-16 DIAGNOSIS — F341 Dysthymic disorder: Secondary | ICD-10-CM

## 2022-06-16 DIAGNOSIS — S92511A Displaced fracture of proximal phalanx of right lesser toe(s), initial encounter for closed fracture: Secondary | ICD-10-CM

## 2022-06-16 DIAGNOSIS — M5441 Lumbago with sciatica, right side: Secondary | ICD-10-CM

## 2022-06-16 MED ORDER — CARBAMAZEPINE 200 MG PO TABS: 200.0000 mg | ORAL_TABLET | Freq: Two times a day (BID) | ORAL | 3 refills | Status: DC

## 2022-06-16 MED ORDER — AMLODIPINE BESYLATE 5 MG PO TABS
10.0000 mg | ORAL_TABLET | Freq: Every day | ORAL | 3 refills | Status: DC
Start: 2022-06-16 — End: 2022-07-15

## 2022-06-16 MED ORDER — HYDROCHLOROTHIAZIDE 25 MG PO TABS: 25.0000 mg | ORAL_TABLET | Freq: Every day | ORAL | 3 refills | Status: DC

## 2022-06-16 MED ORDER — OXYCODONE HCL 5 MG PO TABS: 5.0000 mg | ORAL_TABLET | Freq: Three times a day (TID) | ORAL | 0 refills | Status: AC | PRN

## 2022-06-16 MED ORDER — BUSPIRONE HCL 10 MG PO TABS
10.0000 mg | ORAL_TABLET | Freq: Every day | ORAL | 1 refills | Status: DC
Start: 2022-06-16 — End: 2022-11-21

## 2022-06-16 MED ORDER — HYDROCHLOROTHIAZIDE 25 MG PO TABS
25.0000 mg | ORAL_TABLET | Freq: Every day | ORAL | Status: DC
  Administered 2022-06-16: 25 mg via ORAL
  Filled 2022-06-16: qty 1

## 2022-06-16 MED ORDER — FOSFOMYCIN TROMETHAMINE 3 G PO PACK
3.0000 g | PACK | Freq: Once | ORAL | Status: AC
  Administered 2022-06-16: 3 g via ORAL
  Filled 2022-06-16: qty 3

## 2022-06-16 NOTE — Progress Notes (Signed)
Physical Therapy Treatment Patient Details Name: Nancy Schroeder MRN: [id number] DOB: 08-26-1933 Today's Date: 06/16/2022   History of Present Illness Pt is 89 yo female who presents on 06/12/22 after several falls thought to be due to LE weakness. She was found to have R 3rd and 4th toe fractures and possible UTI.  PMH: HTN, anxiety, depression, LBP.    PT Comments    Patient resting in bed at start of session, agreeable to therapy. Pt able to complete bed mobility with supervision and transfers bed>chair with RW and min assist. Ambulated ~35' in one bout with slight flexing of Rt knee as pt fatigued and drift to Rt. Pt able to ambulate a second bout of ~15' before fatiguing again. Min assist required with Rollator as it moves quickly and pt able to control RW on second bout with min guard/assist as she fatigued. Pt's son present and reports this is typical distance for pt and she has not been ambulating much more in home. Pt's son reports he has a ramp to enter home and has obtained a WC for pt. Reviewed safe WC transfer to navigate stairs with 2 persons assist as pt has friend who is planning to help with pt's return home. Will continue to progress pt as able.   Recommendations for follow up therapy are one component of a multi-disciplinary discharge planning process, led by the attending physician.  Recommendations may be updated based on patient status, additional functional criteria and insurance authorization.  Follow Up Recommendations       Assistance Recommended at Discharge Frequent or constant Supervision/Assistance  Patient can return home with the following A lot of help with walking and/or transfers;Help with stairs or ramp for entrance;Assist for transportation;Direct supervision/assist for financial management;Direct supervision/assist for medications management;Assistance with cooking/housework;A little help with bathing/dressing/bathroom   Equipment Recommendations   Rollator (4 wheels)    Recommendations for Other Services OT consult     Precautions / Restrictions Precautions Precautions: Fall Precaution Comments: fell 4x the day she came in and had falls prior to that too. Cannot get self up Required Braces or Orthoses: Other Brace Other Brace: R post op shoe Restrictions Weight Bearing Restrictions: No RLE Weight Bearing: Weight bearing as tolerated     Mobility  Bed Mobility Overal bed mobility: Needs Assistance Bed Mobility: Supine to Sit     Supine to sit: Supervision, HOB elevated          Transfers Overall transfer level: Needs assistance Equipment used: Rollator (4 wheels) Transfers: Sit to/from Stand, Bed to chair/wheelchair/BSC Sit to Stand: Min guard   Step pivot transfers: Min assist            Ambulation/Gait Ambulation/Gait assistance: Editor, commissioning (Feet): 35 Feet Assistive device: Rollator (4 wheels) Gait Pattern/deviations: Step-through pattern, Shuffle, Decreased stride length, Wide base of support, Drifts right/left Gait velocity: decr         Stairs             Wheelchair Mobility    Modified Rankin (Stroke Patients Only)       Balance Overall balance assessment: Needs assistance, History of Falls Sitting-balance support: Feet supported, No upper extremity supported Sitting balance-Leahy Scale: Fair     Standing balance support: Reliant on assistive device for balance, Bilateral upper extremity supported, During functional activity Standing balance-Leahy Scale: Poor  Cognition Arousal/Alertness: Awake/alert Behavior During Therapy: WFL for tasks assessed/performed Overall Cognitive Status: Impaired/Different from baseline Area of Impairment: Attention, Memory                   Current Attention Level: Selective Memory: Decreased short-term memory         General Comments: continues to have some word finding  difficulties and STM deficits.        Exercises      General Comments        Pertinent Vitals/Pain Pain Assessment Pain Assessment: Faces Faces Pain Scale: Hurts even more Pain Location: bil LE in lower legs with mobility Pain Descriptors / Indicators: Tingling, Sore, Burning Pain Intervention(s): Limited activity within patient's tolerance, Monitored during session, Repositioned    Home Living                          Prior Function            PT Goals (current goals can now be found in the care plan section) Acute Rehab PT Goals Patient Stated Goal: return home PT Goal Formulation: With patient Time For Goal Achievement: 06/28/22 Potential to Achieve Goals: Good Progress towards PT goals: Progressing toward goals    Frequency    Min 3X/week      PT Plan Current plan remains appropriate    Co-evaluation              AM-PAC PT "6 Clicks" Mobility   Outcome Measure  Help needed turning from your back to your side while in a flat bed without using bedrails?: A Little Help needed moving from lying on your back to sitting on the side of a flat bed without using bedrails?: A Little Help needed moving to and from a bed to a chair (including a wheelchair)?: A Little Help needed standing up from a chair using your arms (e.g., wheelchair or bedside chair)?: A Little Help needed to walk in hospital room?: A Little Help needed climbing 3-5 steps with a railing? : Total 6 Click Score: 16    End of Session Equipment Utilized During Treatment: Gait belt Activity Tolerance: Patient limited by pain Patient left: in chair;with call bell/phone within reach;with chair alarm set;with family/visitor present Nurse Communication: Mobility status PT Visit Diagnosis: Unsteadiness on feet (R26.81);History of falling (Z91.81);Difficulty in walking, not elsewhere classified (R26.2);Pain Pain - Right/Left: Right Pain - part of body: Ankle and joints of foot;Hip      Time: 1610-9604 PT Time Calculation (min) (ACUTE ONLY): 28 min  Charges:  $Gait Training: 8-22 mins $Therapeutic Activity: 8-22 mins                     Wynn Maudlin, DPT Acute Rehabilitation Services Office [phone]  06/16/22 12:31 PM

## 2022-06-16 NOTE — Plan of Care (Signed)

## 2022-06-16 NOTE — TOC Transition Note (Signed)
Transition of Care [facility]) - CM/SW Discharge Note   Patient Details  Name: Nancy Schroeder MRN: [id number] Date of Birth: 1933/07/09  [facility]) CM/SW Contact:  Janae Bridgeman, RN Phone Number: 06/16/2022, 11:01 AM   Clinical Narrative:    CM met with the patient and son, [name] at the bedside to discuss patient's TOC needs for home.  The patient is set up with [facility] for home health services and [facility] orders are in placed.  DME at the home includes transport WC, rolator, RW.  The patient's son states that he ordered a WC ramp and is pending delivery to the home through [employer].  The patient's son plans to provide transportation to the home via private car.   Final next level of care: Home w Home Health Services Barriers to Discharge: No Barriers Identified   Patient Goals and CMS Choice CMS Medicare.gov Compare Post Acute Care list provided to:: Patient Represenative (must comment) Rossie Muskrat, son at the bedside) Choice offered to / list presented to : Spouse  Discharge Placement                         Discharge Plan and Services Additional resources added to the After Visit Summary for     Discharge Planning Services: CM Consult Post Acute Care Choice: Home Health          DME Arranged: Walker rolling with seat DME Agency: [facility] Date DME Agency Contacted: 06/15/22 Time DME Agency Contacted: [phone] Representative spoke with at DME Agency: Barbara Cower HH Arranged: PT, OT, Nurse's Aide HH Agency: [facility] Date [facility] Agency Contacted: 06/16/22 Time HH Agency Contacted: [phone] Representative spoke with at [facility] Agency: Kandee Keen, [facility] with [facility] - confirmed acceptance for home health services  Social Determinants of Health (SDOH) Interventions SDOH Screenings   Food Insecurity: No Food Insecurity (06/12/2022)  Housing: Low Risk  (06/12/2022)  Transportation Needs: No Transportation Needs (06/12/2022)  Utilities: Not At Risk (06/12/2022)   Alcohol Screen: Low Risk  (08/29/2020)  Depression (PHQ2-9): Low Risk  (06/09/2022)  Financial Resource Strain: Medium Risk (08/29/2020)  Physical Activity: Inactive (09/14/2021)  Social Connections: Socially Isolated (08/29/2020)  Stress: No Stress Concern Present (08/29/2020)  Tobacco Use: Low Risk  (06/12/2022)     Readmission Risk Interventions    06/16/2022   11:01 AM 06/15/2022    1:04 PM  Readmission Risk Prevention Plan  Post Dischage Appt Complete Complete  Medication Screening Complete Complete  Transportation Screening Complete Complete

## 2022-06-16 NOTE — Discharge Summary (Signed)
Physician Discharge Summary   Patient: Nancy Schroeder MRN: [id number] DOB: 1933/07/29  Admit date:     06/12/2022  Discharge date: 06/16/22  Discharge Physician: Thad Ranger, MD    PCP: Rica Records, FNP   Recommendations at discharge:   HCTZ increased to 25 mg daily Norvasc increased to 10 mg daily Outpatient follow-up with orthopedics  Discharge Diagnoses:     Recurrent falls Right foot, third and fourth toes fracture   Hypertensive urgency   Hypothyroidism   Acute lower Enterococcus UTI   Low back pain   Anxiety    Hospital Course:  Patient is a 89 year old female with anxiety, depression, essential hypertension, who presented to the emergency room with recurrent falls secondary to generalized lower extremity weakness.  A day before the admission, she fell on her deck and on the day of admission, she fell in the shower.  The patient was noted to have fractures to her third and fourth right toes.  UA was positive for pyuria, was started on Rocephin for UTI   Assessment and Plan:   Recurrent falls Third and fourth toes fractures, right foot Continue fall precautions Orthopedics consulted. Dr Margo Aye discussed with Dr. Charlann Boxer, recommended post op shoe and weight bear as tolerated with right cast shoe.   -Follow up with ortho after discharge, with Dr. Odis Hollingshead.   Bilateral lower extremity weakness PT OT recommended home health PT OT Continue fall precautions CRP 1.4, CK 54, ESR 29, TSH 2.6 MRI lumbar spine showed multilevel degenerative changes of the lumbar spine, varying degrees of mild to moderate subarticular and neural foraminal stenosis, no high-grade spinal canal stenosis.   Hypertensive urgency BP improving, increase Norvasc to 10 mg daily. HCTZ increased to 25 mg daily Continue lisinopril 40 mg daily   UTI, POA Patient was initially placed o  Hypothyroidism Continue home Synthroid   Low back pain Analgesics as needed    Anxiety Continue home BuSpar       Pain control - [place] Controlled Substance Reporting System database was reviewed. and patient was instructed, not to drive, operate heavy machinery, perform activities at heights, swimming or participation in water activities or provide baby-sitting services while on Pain, Sleep and Anxiety Medications; until their outpatient Physician has advised to do so again. Also recommended to not to take more than prescribed Pain, Sleep and Anxiety Medications.  Consultants: Orthopedics Procedures performed: None Disposition: Home Diet recommendation:  Discharge Diet Orders (From admission, onward)     Start     Ordered   06/16/22 0000  Diet - low sodium heart healthy        06/16/22 1121            DISCHARGE MEDICATION: Allergies as of 06/16/2022       Reactions   Amoxicillin Hives   Augmentin [amoxicillin-pot Clavulanate] Hives        Medication List     STOP taking these medications    diphenhydrAMINE 25 mg capsule Commonly known as: BENADRYL   ibuprofen 800 MG tablet Commonly known as: ADVIL   lidocaine-prilocaine cream Commonly known as: EMLA   tiZANidine 2 MG tablet Commonly known as: ZANAFLEX   triamcinolone cream 0.1 % Commonly known as: KENALOG       TAKE these medications    amLODipine 5 MG tablet Commonly known as: NORVASC Take 2 tablets (10 mg total) by mouth daily. Hypertension What changed: how much to take   aspirin EC 81 MG tablet Take 81 mg by  mouth daily. Swallow whole.   busPIRone 10 MG tablet Commonly known as: BUSPAR Take 1 tablet (10 mg total) by mouth daily.   carbamazepine 200 MG tablet Commonly known as: TEGRETOL Take 1 tablet (200 mg total) by mouth in the morning and at bedtime.   DULoxetine 20 MG capsule Commonly known as: Cymbalta Take 1 capsule (20 mg total) by mouth daily.   hydrochlorothiazide 25 MG tablet Commonly known as: HYDRODIURIL Take 1 tablet (25 mg total) by  mouth daily. Hypertension What changed:  medication strength how much to take   levothyroxine 25 MCG tablet Commonly known as: SYNTHROID Take 1 tablet (25 mcg total) by mouth daily before breakfast.   lisinopril 40 MG tablet Commonly known as: ZESTRIL Take 1 tablet (40 mg total) by mouth daily. Hypertension   oxyCODONE 5 MG immediate release tablet Commonly known as: Oxy IR/ROXICODONE Take 1 tablet (5 mg total) by mouth every 8 (eight) hours as needed for up to 5 days for severe pain.               Durable Medical Equipment  (From admission, onward)           Start     Ordered   06/15/22 1134  For home use only DME 4 wheeled rolling walker with seat  Once       Question:  Patient needs a walker to treat with the following condition  Answer:  Ambulatory dysfunction   06/15/22 1133            Follow-up Information     Netta Cedars, MD. Call today.   Specialty: Orthopedic Surgery Why: Follow up with Dr. Odis Hollingshead for your right 3rd and 4th toes fractures after your discharge from the hospital. Contact information: [address] [place] [address] [phone]         Care, [facility] Follow up.   Specialty: Home Health Services Why: Home health has been arranged. They will contact you to schedule apt within 48hrs post discharge. Contact information: [address] [place] [address] [phone]         Anabel Halon, MD. Schedule an appointment as soon as possible for a visit in 2 week(s).   Specialty: Internal Medicine Why: Please call for a posthospital follow-up appointment. Contact information: [address] [place] [address] [phone]                Discharge Exam: Ceasar Mons Weights   06/12/22 1125  Weight: 79.8 kg   S: Alert and oriented, wants to go home.  Son at the bedside.  No acute issues.  BP (!) 179/54 (BP Location: Right Arm)   Pulse 78   Temp (!) 97.4 F  (36.3 C) (Oral)   Resp 17   Ht 5' (1.524 m)   Wt 79.8 kg   SpO2 96%   BMI 34.37 kg/m   Physical Exam General: Alert and oriented x 3, NAD Cardiovascular: S1 S2 clear, RRR.  Respiratory: CTAB, no wheezing, rales or rhonchi Gastrointestinal: Soft, nontender, nondistended, NBS Ext: no pedal edema bilaterally Neuro: no new deficits Psych: Normal affect    Condition at discharge: fair  The results of significant diagnostics from this hospitalization (including imaging, microbiology, ancillary and laboratory) are listed below for reference.   Imaging Studies: DG Foot Complete Right  Result Date: 06/12/2022 CLINICAL DATA:  Pain and difficulty walking status post fall. EXAM: RIGHT FOOT COMPLETE - 3+ VIEW COMPARISON:  None  Available. FINDINGS: Acute mildly displaced oblique fracture of the proximal shaft of the third toe proximal phalanx. The fracture does not extend to the articular surface. The distal fracture fragment is displaced laterally approximately 2 mm. No significant impaction or angulation. Acute minimally displaced intra-articular fracture of the medial base of the fourth toe proximal phalanx. The distal fracture fragment is displaced approximately 1 mm laterally. No significant angulation. The fourth toe MTP joint space is intact. No other acute osseous abnormality. 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 Tiny posterior and small plantar calcaneal enthesophytes. Diffuse osteopenia. Vascular calcifications noted at the level of the distal right lower leg and within the right midfoot. IMPRESSION: 1. Acute mildly displaced oblique fracture of the proximal shaft of the third toe proximal phalanx. 2. Acute minimally displaced intra-articular fracture of the medial base of the fourth toe proximal phalanx. Electronically Signed   By: Sherron Ales M.D.   On: 06/12/2022 18:07   DG Pelvis 1-2 Views  Result Date: 06/12/2022 CLINICAL DATA:  Pain after fall EXAM: PELVIS - 1-2 VIEW  COMPARISON:  None Available. FINDINGS: There is no evidence of pelvic fracture or diastasis. No pelvic bone lesions are seen. IMPRESSION: Negative. Electronically Signed   By: Gerome Sam III M.D.   On: 06/12/2022 18:00   MR Lumbar Spine W Wo Contrast  Result Date: 06/12/2022 CLINICAL DATA:  Low back pain, cauda equina syndrome suspected EXAM: MRI LUMBAR SPINE WITHOUT AND WITH CONTRAST TECHNIQUE: Multiplanar and multiecho pulse sequences of the lumbar spine were obtained without and with intravenous contrast. CONTRAST:  8mL GADAVIST GADOBUTROL 1 MMOL/ML IV SOLN COMPARISON:  Radiograph 06/09/2022 FINDINGS: Segmentation:  Standard. Alignment:  Physiologic. Vertebrae: There is no evidence of acute fracture, discitis, or aggressive osseous lesion. Mild degenerative endplate edema on the left posteriorly at S1. Conus medullaris and cauda equina: Conus extends to the L1 level. Conus and cauda equina appear normal. Paraspinal and other soft tissues: Lower paraspinal muscle atrophy. Disc levels: T11-T12: No significant stenosis. T12-L1: No significant stenosis. L1-L2: Mild disc bulging endplate spurring. No significant spinal canal stenosis. Mild bilateral neural foraminal stenosis. L2-L3: Bilateral disc bulging, left greater than right, moderate left and mild right subarticular and neural foraminal stenosis. There is abutment of the descending left L3 nerve root (series 8, image 15). L3-L4: Mild asymmetric left disc bulging and left facet spurring. Mild left-sided neural foraminal stenosis. No spinal canal stenosis. L4-L5: Trace degenerative anterolisthesis with mild disc bulging, ligamentum hypertrophy and right greater left facet arthropathy. There is moderate right and mild left subarticular narrowing, encroaching the descending right L5 nerve root. There is no significant neural foraminal narrowing. L5-S1: Moderate-severe disc height loss with endplate spurring, ligamentum flavum hypertrophy mild facet  arthropathy. There is mild, left greater than right subarticular narrowing encroaching the descending left S1 nerve root. There is moderate left and mild right neural foraminal stenosis. IMPRESSION: Multilevel degenerative changes of the lumbar spine with varying degrees of mild to moderate subarticular and neural foraminal stenoses, as summarized below. No high-grade spinal canal stenosis. L1-L2: Mild bilateral neural foraminal stenosis. No significant spinal canal stenosis. L2-L3: Moderate left and mild right subarticular and neural foraminal stenosis. Abutment of the descending left L3 nerve root. L3-L4: Mild left-sided neural foraminal stenosis. No spinal canal stenosis. L4-L5: Moderate right and mild left subarticular narrowing, encroaching the descending right L5 nerve root. No significant neural foraminal stenosis. L5-S1: Mild, left greater than right subarticular narrowing encroaching the descending left S1 nerve root. Moderate left  and mild right neural foraminal stenosis. Electronically Signed   By: Caprice Renshaw M.D.   On: 06/12/2022 16:28   MR BRAIN WO CONTRAST  Result Date: 06/12/2022 CLINICAL DATA:  Provided history: Transient ischemic attack (TIA). Lightheadedness. Recurrent falls. Lower leg weakness. EXAM: MRI HEAD WITHOUT CONTRAST TECHNIQUE: Multiplanar, multiecho pulse sequences of the brain and surrounding structures were obtained without intravenous contrast. COMPARISON:  Brain MRI 01/07/2021 FINDINGS: Brain: No age advanced or lobar predominant parenchymal atrophy. Small focus of chronic encephalomalacia/gliosis within the inferior right temporal lobe. Chronic lacunar infarct within the right lentiform nucleus. Mild multifocal T2 FLAIR hyperintense signal abnormality elsewhere within the cerebral white matter and pons, nonspecific but compatible with chronic small vessel disease. 2.5 cm chronic infarct within the inferior left cerebellar hemisphere. Gliosis within the ventral aspect of the  medulla. There is no acute infarct. No evidence of an intracranial mass. No extra-axial fluid collection. No midline shift. Vascular: Maintained flow voids within the proximal large arterial vessels. Skull and upper cervical spine: No focal suspicious marrow lesion. Susceptibility artifact arising from the cervical spine at the C3-C4 level, presumably from fusion hardware or a disc prosthesis. Sinuses/Orbits: No mass or acute finding within the imaged orbits. Prior but ocular lens replacement. 5 mm mucous retention cyst within the left maxillary sinus. IMPRESSION: 1.  No evidence of an acute intracranial abnormality. 2. Stable non-contrast MRI appearance of the brain as compared to 01/07/2021. 3. Small focus of chronic encephalomalacia/gliosis within the inferior right temporal lobe, which may reflect a chronic infarct, or may be posttraumatic in etiology. 4. 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 5. Mild chronic small vessel ischemic changes within the cerebral white matter and pons. 6. Redemonstrated nonspecific gliosis within the ventral aspect of the medulla. 7. 5 mm left maxillary sinus mucous retention cyst. Electronically Signed   By: Jackey Loge D.O.   On: 06/12/2022 15:01   DG Lumbar Spine 2-3 Views  Result Date: 06/09/2022 CLINICAL DATA:  Chronic BILATERAL low back pain radiating down both legs RIGHT greater than LEFT EXAM: LUMBAR SPINE - 2-3 VIEW COMPARISON:  None available FINDINGS: Five non-rib-bearing lumbar vertebra. Diffuse osseous demineralization. Disc space narrowing L4-L5 and L5-S1. Vertebral body heights maintained without fracture or subluxation. No bone destruction or gross spondylolysis. Facet degenerative changes L4-L5. SI joints preserved. Atherosclerotic calcification aorta. IMPRESSION: Osseous demineralization with degenerative disc and facet disease changes of lower lumbar spine. No acute abnormalities. Aortic Atherosclerosis (ICD10-I70.0).  Electronically Signed   By: Ulyses Southward M.D.   On: 06/09/2022 18:29    Microbiology: Results for orders placed or performed during the hospital encounter of 06/12/22  Urine Culture (for pregnant, neutropenic or urologic patients or patients with an indwelling urinary catheter)     Status: Abnormal   Collection Time: 06/12/22  4:09 PM   Specimen: Urine, Clean Catch  Result Value Ref Range Status   Specimen Description URINE, CLEAN CATCH  Final   Special Requests   Final    NONE Performed at [facility], [address] [place], [place] [address]    Culture >=100,000 COLONIES/mL ENTEROCOCCUS FAECALIS (A)  Final   Report Status 06/15/2022 FINAL  Final   Organism ID, Bacteria ENTEROCOCCUS FAECALIS (A)  Final      Susceptibility   Enterococcus faecalis - MIC*    AMPICILLIN <=2 SENSITIVE Sensitive     NITROFURANTOIN <=16 SENSITIVE Sensitive     VANCOMYCIN 1 SENSITIVE Sensitive     * >=  100,000 COLONIES/mL ENTEROCOCCUS FAECALIS    Labs: CBC: Recent Labs  Lab 06/12/22 1135 06/13/22 0638 06/14/22 0448  WBC 10.3 8.6 10.5  HGB 14.3 12.0 12.0  HCT 44.2 36.2 37.0  MCV 94.4 92.1 94.9  PLT 372 298 301   Basic Metabolic Panel: Recent Labs  Lab 06/12/22 1135 06/13/22 0638 06/14/22 0448  NA 140 136 137  K 4.1 3.3* 4.3  CL 104 105 110  CO2 22 22 23   GLUCOSE 123* 119* 111*  BUN 18 18 19   CREATININE 0.72 0.68 0.66  CALCIUM 9.5 8.5* 8.5*  MG 2.1  --  2.2   Liver Function Tests: No results for input(s): "AST", "ALT", "ALKPHOS", "BILITOT", "PROT", "ALBUMIN" in the last 168 hours. CBG: No results for input(s): "GLUCAP" in the last 168 hours.  Discharge time spent: greater than 30 minutes.  Signed: Thad Ranger, MD [facility] 06/16/2022

## 2022-06-17 ENCOUNTER — Telehealth: Payer: Self-pay

## 2022-06-17 ENCOUNTER — Ambulatory Visit: Payer: Medicare HMO | Admitting: Internal Medicine

## 2022-06-17 NOTE — Transitions of Care (Post Inpatient/ED Visit) (Signed)
06/17/2022  Name: Nancy Schroeder MRN: [id number] DOB: 1933/05/18  Today's TOC FU Call Status: Today's TOC FU Call Status:: Successful TOC FU Call Competed TOC FU Call Complete Date: 06/17/22  Transition Care Management Follow-up Telephone Call Date of Discharge: 06/16/22 Discharge Facility[facility]) Type of Discharge: Inpatient Admission Primary Inpatient Discharge Diagnosis:: Urinary Tract Infection; [place] How have you been since you were released from the hospital?: Better (Per son [name], patient is eating and drinking well but she can't walk due to her knees and back) Any questions or concerns?: Yes Patient Questions/Concerns:: Attmpted to explain to son the process of FL2 and his Mom's physician would need to complete and then send to facilities to see who could accept patient. Patient Questions/Concerns Addressed: Other:  Items Reviewed: Did you receive and understand the discharge instructions provided?: Yes Medications obtained,verified, and reconciled?: Yes (Medications Reviewed) Medications Not Reviewed Reasons:: Other: Any new allergies since your discharge?: No Dietary orders reviewed?: No Do you have support at home?: Yes People in Home: child(ren), adult Name of Support/Comfort Primary Source: [name]  Medications Reviewed Today: Medications Reviewed Today     Reviewed by Jodelle Gross, RN (Case Manager) on 06/17/22 at 1652  Med List Status: <None>   Medication Order Taking? Sig Documenting Provider Last Dose Status Informant  amLODipine (NORVASC) 5 MG tablet [id number] Yes Take 2 tablets (10 mg total) by mouth daily. Hypertension Rai, Delene Ruffini, MD Taking Active   aspirin EC 81 MG tablet [id number] No Take 81 mg by mouth daily. Swallow whole.  Patient not taking: Reported on 06/13/2022   [provider] Not Taking Active Child, Pharmacy Records  busPIRone (BUSPAR) 10 MG tablet [id number] Yes Take 1 tablet (10 mg total) by mouth daily.  Rai, Delene Ruffini, MD Taking Active   carbamazepine (TEGRETOL) 200 MG tablet [id number] Yes Take 1 tablet (200 mg total) by mouth in the morning and at bedtime. Rai, Delene Ruffini, MD Taking Active   DULoxetine (CYMBALTA) 20 MG capsule [id number] No Take 1 capsule (20 mg total) by mouth daily.  Patient not taking: Reported on 06/13/2022   Rica Records, FNP Not Taking Active Child, Pharmacy Records  hydrochlorothiazide (HYDRODIURIL) 25 MG tablet [id number] Yes Take 1 tablet (25 mg total) by mouth daily. Hypertension Rai, Delene Ruffini, MD Taking Active   levothyroxine (SYNTHROID) 25 MCG tablet [id number] Yes Take 1 tablet (25 mcg total) by mouth daily before breakfast. Anabel Halon, MD Taking Active Child, Pharmacy Records  lisinopril (ZESTRIL) 40 MG tablet [id number] Yes Take 1 tablet (40 mg total) by mouth daily. Hypertension Del Newman Nip, Tenna Child, FNP Taking Active Child, Pharmacy Records  oxyCODONE (OXY IR/ROXICODONE) 5 MG immediate release tablet [id number] Yes Take 1 tablet (5 mg total) by mouth every 8 (eight) hours as needed for up to 5 days for severe pain. Cathren Harsh, MD Taking Active   Med List Note Rulon Sera, CPhT 06/13/22 1044): Son handles the Pt's medications.             Home Care and Equipment/Supplies: Were Home Health Services Ordered?: Yes [facility] Agency[facility] Has Agency set up a time to come to your home?: Yes First Home Health Visit Date: 06/18/22 (PT 2:30 PM) Any new equipment or medical supplies ordered?: Yes Name of Medical supply agency?: [employer] Were you able to get the equipment/medical supplies?: Yes Do you have any questions related to the use of the equipment/supplies?: No  Functional Questionnaire: Do you  need assistance with bathing/showering or dressing?: Yes Do you need assistance with meal preparation?: Yes Do you need assistance with eating?: No Do you have difficulty maintaining continence: No Do you need assistance with  getting out of bed/getting out of a chair/moving?: Yes Do you have difficulty managing or taking your medications?: Yes  Follow up appointments reviewed: PCP Follow-up appointment confirmed?: No MD Provider Line [phone] Given: No (Patient's son to address/call) Specialist Hospital Follow-up appointment confirmed?: No Reason Specialist Follow-Up Not Confirmed: Patient has Specialist Provider Number and will Call for Appointment Do you need transportation to your follow-up appointment?: No Do you understand care options if your condition(s) worsen?: Yes-patient verbalized understanding  SDOH Interventions Today    Flowsheet Row Most Recent Value  SDOH Interventions   Food Insecurity Interventions Intervention Not Indicated  Housing Interventions Intervention Not Indicated  Transportation Interventions Intervention Not Indicated      Interventions Today    Flowsheet Row Most Recent Value  Chronic Disease   Chronic disease during today's visit Other  [Frequent falls/UTI]  General Interventions   General Interventions Discussed/Reviewed General Interventions Discussed       TOC Interventions Today    Flowsheet Row Most Recent Value  TOC Interventions   TOC Interventions Discussed/Reviewed TOC Interventions Discussed, TOC Interventions Reviewed  [Referred to [employer], BSW to assist patient/son with potential placement in SNF]       Jodelle Gross, RN, BSN, CCM Care Management Coordinator [facility] Phone: [phone]/Fax: [phone]

## 2022-06-18 ENCOUNTER — Telehealth: Payer: Self-pay | Admitting: Family Medicine

## 2022-06-18 ENCOUNTER — Ambulatory Visit: Payer: Self-pay

## 2022-06-18 DIAGNOSIS — I10 Essential (primary) hypertension: Secondary | ICD-10-CM | POA: Diagnosis not present

## 2022-06-18 DIAGNOSIS — N390 Urinary tract infection, site not specified: Secondary | ICD-10-CM | POA: Diagnosis not present

## 2022-06-18 DIAGNOSIS — G9389 Other specified disorders of brain: Secondary | ICD-10-CM | POA: Diagnosis not present

## 2022-06-18 DIAGNOSIS — M5136 Other intervertebral disc degeneration, lumbar region: Secondary | ICD-10-CM | POA: Diagnosis not present

## 2022-06-18 DIAGNOSIS — E039 Hypothyroidism, unspecified: Secondary | ICD-10-CM | POA: Diagnosis not present

## 2022-06-18 DIAGNOSIS — S92511D Displaced fracture of proximal phalanx of right lesser toe(s), subsequent encounter for fracture with routine healing: Secondary | ICD-10-CM | POA: Diagnosis not present

## 2022-06-18 DIAGNOSIS — G8929 Other chronic pain: Secondary | ICD-10-CM | POA: Diagnosis not present

## 2022-06-18 DIAGNOSIS — M9973 Connective tissue and disc stenosis of intervertebral foramina of lumbar region: Secondary | ICD-10-CM | POA: Diagnosis not present

## 2022-06-18 DIAGNOSIS — F419 Anxiety disorder, unspecified: Secondary | ICD-10-CM | POA: Diagnosis not present

## 2022-06-18 NOTE — Patient Instructions (Signed)
Visit Information  Thank you for taking time to visit with me today. Please don't hesitate to contact me if I can be of assistance to you.   Following are the goals we discussed today:   Goals Addressed             This Visit's Progress    Improve mobility       -Patients son wants her to walk better -Patients son agrees to continue to work with [facility] for physical therapy        If you are experiencing a Mental Health or Behavioral Health Crisis or need someone to talk to, please call 911  Patient verbalizes understanding of instructions and care plan provided today and agrees to view in MyChart. Active MyChart status and patient understanding of how to access instructions and care plan via MyChart confirmed with patient.     No further follow up required: Patient son will work with [employer] staff.  Lysle Morales, BSW Social Worker [facility] Care Management  [phone]

## 2022-06-18 NOTE — Patient Outreach (Signed)
  Care Coordination   Initial Visit Note   06/18/2022 Name: Shayla Noguera MRN: [id number] DOB: 06-07-1933  Ova Bolhuis is a 89 y.o. year old female who sees Del Newman Nip, Tenna Child, FNP for primary care. I spoke with  [name] son Rossie Muskrat by phone today.  What matters to the patients health and wellness today?  Patients son wants his  mother to improve with mobility.  Physical Therapy started today and he has a better understanding of how it will help his mother.  Initially , the son wanted SNF placement, but agrees to work with PT first and speak to the doctor if patient declines.   Goals Addressed             This Visit's Progress    Improve mobility       -Patients son wants her to walk better -Patients son agrees to continue to work with [facility] for physical therapy        SDOH assessments and interventions completed:  No     Care Coordination Interventions:  Yes, provided   -SW educated patients son on SNF and process of placement -SW spoke to Denzil Magnuson with [facility] and PT services will be provided 2-3 weeks to include SW services for the family  Follow up plan: No further intervention required.   Encounter Outcome:  Pt. Visit Completed

## 2022-06-18 NOTE — Telephone Encounter (Signed)
Denzil Magnuson physical therapist w[facility] called in on patient behalf.  FYI patient has been experiencing black soot colored watery stool    Orders for  Social work  Home health aid And OT    Physical therapy   1 week 7  2 week 4  1 week 3   Call back BEN:  [phone]. Will be heading out of town this evening.  Can reach out to [employer] [phone]

## 2022-06-18 NOTE — Telephone Encounter (Signed)
Yes

## 2022-06-21 ENCOUNTER — Ambulatory Visit
Admission: EM | Admit: 2022-06-21 | Discharge: 2022-06-21 | Disposition: A | Discharge status: Home / Self Care | Payer: Medicare HMO | Attending: Nurse Practitioner | Admitting: Nurse Practitioner

## 2022-06-21 ENCOUNTER — Ambulatory Visit: Payer: Medicare HMO | Admitting: Orthopedic Surgery

## 2022-06-21 DIAGNOSIS — M79604 Pain in right leg: Secondary | ICD-10-CM | POA: Diagnosis not present

## 2022-06-21 DIAGNOSIS — M79605 Pain in left leg: Secondary | ICD-10-CM

## 2022-06-21 DIAGNOSIS — R29898 Other symptoms and signs involving the musculoskeletal system: Secondary | ICD-10-CM | POA: Diagnosis not present

## 2022-06-21 MED ORDER — GABAPENTIN 100 MG PO CAPS: 100.0000 mg | ORAL_CAPSULE | Freq: Three times a day (TID) | ORAL | 0 refills | Status: DC

## 2022-06-21 NOTE — ED Triage Notes (Addendum)
Pt c/o leg swelling pt states they are sore, swelling started over night, pain in both legs making it difficult to stand. Weakness in both legs.    Pt states it is a pinching and burning sensation like being stung or bitten. Thy feel hot at night

## 2022-06-21 NOTE — Telephone Encounter (Signed)
Left VM for Nancy Schroeder.

## 2022-06-21 NOTE — Discharge Instructions (Addendum)
As discussed, it is recommended that you follow-up with orthopedics, orthopedic surgery, and neurology.  Please call and reschedule your appointment as soon as possible. Elevate the lower extremities is much as possible while symptoms persist. Recommend exercising the lower extremities while you are in bed is much as possible to improve circulation. If you become unable to walk, with worsening numbness, tingling, or pain, please go to the emergency department immediately for further evaluation. I am providing a copy of your after visit summary that was provided to you at the time of your hospital discharge.  Please follow-up with the providers on the list for further evaluation. Follow-up as needed.

## 2022-06-21 NOTE — ED Provider Notes (Signed)
[facility]    CSN: [id number] Arrival date & time: 06/21/22  1323      History   Chief Complaint No chief complaint on file.   HPI Nancy Schroeder is a 89 y.o. female.   The history is provided by the patient and a relative (son).   Patient presents for complaints of bilateral lower extremity pain and weakness.  Patient states symptoms have been present for approximately 2 months.  She states that she was recently discharged from the hospital.  Patient's son states that MRIs and x-rays were performed, but "they could not find anything".  The patient states that she has experienced falls, and difficulty standing due to the worsening weakness in her legs.  Patient states that it feels like her legs are "swollen".  Patient reports that when she was discharged from the hospital, she was given pain medication, but she does not take it.  Patient describes the pain in her legs as "numbness and tingling".  Patient states that she has pain when the sheets of her bed touch her legs.  Patient is currently in a wheelchair.  Patient's son states that he has difficulty getting the patient in and out of her home.  Patient has been evaluated by home health, but patient's son states that he told them he wanted to "wait 3 weeks" before doing anything further.  Patient reports prior history of back injury.  Patient was recently discharged from [facility] for her urinary tract infection, hypertensive urgency, anxiety, low back pain, and recurrent falls.  Past Medical History:  Diagnosis Date   Anxiety    Depression    Essential hypertension    H/O back injury 2016    Patient Active Problem List   Diagnosis Date Noted   Acute lower UTI 06/13/2022   Hypertensive urgency 06/13/2022   Anxiety 06/13/2022   Low back pain 06/13/2022   Recurrent falls 06/12/2022   Chronic pain 06/09/2022   Idiopathic peripheral neuropathy 04/07/2022   Vitamin D deficiency 02/25/2022   GAD  (generalized anxiety disorder) 02/25/2022   Cervical radiculopathy 02/25/2022   Lumbar radiculopathy 05/07/2021   Contact dermatitis 05/07/2021   Atypical mole 05/07/2021   Encounter for general adult medical examination with abnormal findings 10/01/2020   Trigeminal neuralgia pain 10/01/2020   Pulmonary nodule 04/29/2020   Fatigue 04/29/2020   Exertional dyspnea 04/21/2020   Hypothyroidism 04/21/2020   Forgetfulness 02/06/2019   Acute midline thoracic back pain 02/06/2019   Obesity (BMI 30.0-34.9) 02/06/2019   Pain due to onychomycosis of toenails of both feet 09/01/2018   Essential hypertension 02/20/2007    Past Surgical History:  Procedure Laterality Date   ABDOMINAL HYSTERECTOMY     total   CATARACT EXTRACTION, BILATERAL  01/2020   CHOLECYSTECTOMY     NECK SURGERY      OB History   No obstetric history on file.      Home Medications    Prior to Admission medications   Medication Sig Start Date End Date Taking? Authorizing Provider  gabapentin (NEURONTIN) 100 MG capsule Take 1 capsule (100 mg total) by mouth 3 (three) times daily for 10 days. 06/21/22 07/01/22 Yes Atalie Oros-Warren, Sadie Haber, NP  amLODipine (NORVASC) 5 MG tablet Take 2 tablets (10 mg total) by mouth daily. Hypertension 06/16/22   Rai, Delene Ruffini, MD  aspirin EC 81 MG tablet Take 81 mg by mouth daily. Swallow whole. Patient not taking: Reported on 06/13/2022    [provider]  busPIRone (BUSPAR)  10 MG tablet Take 1 tablet (10 mg total) by mouth daily. 06/16/22   Rai, Delene Ruffini, MD  carbamazepine (TEGRETOL) 200 MG tablet Take 1 tablet (200 mg total) by mouth in the morning and at bedtime. 06/16/22 10/14/22  Rai, Delene Ruffini, MD  DULoxetine (CYMBALTA) 20 MG capsule Take 1 capsule (20 mg total) by mouth daily. Patient not taking: Reported on 06/13/2022 06/09/22   Del Nigel Berthold, FNP  hydrochlorothiazide (HYDRODIURIL) 25 MG tablet Take 1 tablet (25 mg total) by mouth daily. Hypertension 06/16/22    Rai, Delene Ruffini, MD  levothyroxine (SYNTHROID) 25 MCG tablet Take 1 tablet (25 mcg total) by mouth daily before breakfast. 03/29/22   Anabel Halon, MD  lisinopril (ZESTRIL) 40 MG tablet Take 1 tablet (40 mg total) by mouth daily. Hypertension 05/19/22   Del Nigel Berthold, FNP  oxyCODONE (OXY IR/ROXICODONE) 5 MG immediate release tablet Take 1 tablet (5 mg total) by mouth every 8 (eight) hours as needed for up to 5 days for severe pain. 06/16/22 06/21/22  Cathren Harsh, MD    Family History Family History  Problem Relation Age of Onset   Diabetes Mother    Diabetes Father    Diabetes Sister     Social History Social History   Tobacco Use   Smoking status: Never   Smokeless tobacco: Never  Vaping Use   Vaping Use: Never used  Substance Use Topics   Alcohol use: Yes    Comment: 1-2 drinks per 6 months   Drug use: Never     Allergies   Amoxicillin and Augmentin [amoxicillin-pot clavulanate]   Review of Systems Review of Systems Per HPI  Physical Exam Triage Vital Signs ED Triage Vitals  Enc Vitals Group     BP 06/21/22 1342 (!) 143/82     Pulse Rate 06/21/22 1342 88     Resp 06/21/22 1342 16     Temp 06/21/22 1342 (!) 97.4 F (36.3 C)     Temp Source 06/21/22 1342 Oral     SpO2 06/21/22 1342 96 %     Weight --      Height --      Head Circumference --      Peak Flow --      Pain Score 06/21/22 1344 10     Pain Loc --      Pain Edu? --      Excl. in GC? --    No data found.  Updated Vital Signs BP (!) 143/82 (BP Location: Right Arm)   Pulse 88   Temp (!) 97.4 F (36.3 C) (Oral)   Resp 16   SpO2 96%   Visual Acuity Right Eye Distance:   Left Eye Distance:   Bilateral Distance:    Right Eye Near:   Left Eye Near:    Bilateral Near:     Physical Exam Vitals and nursing note reviewed.  Constitutional:      General: She is not in acute distress.    Appearance: Normal appearance.  HENT:     Head: Normocephalic.  Eyes:     Extraocular  Movements: Extraocular movements intact.     Pupils: Pupils are equal, round, and reactive to light.  Cardiovascular:     Rate and Rhythm: Normal rate and regular rhythm.     Pulses: Normal pulses.     Heart sounds: Normal heart sounds.  Pulmonary:     Effort: Pulmonary effort is normal.     Breath  sounds: Normal breath sounds.  Abdominal:     General: Bowel sounds are normal.     Palpations: Abdomen is soft.  Musculoskeletal:     Cervical back: Normal range of motion.  Skin:    General: Skin is warm and dry.  Neurological:     General: No focal deficit present.     Mental Status: She is alert and oriented to person, place, and time.  Psychiatric:        Mood and Affect: Mood normal.        Behavior: Behavior normal.      UC Treatments / Results  Labs (all labs ordered are listed, but only abnormal results are displayed) Labs Reviewed - No data to display  EKG   Radiology No results found.  Procedures Procedures (including critical care time)  Medications Ordered in UC Medications - No data to display  Initial Impression / Assessment and Plan / UC Course  I have reviewed the triage vital signs and the nursing notes.  Pertinent labs & imaging results that were available during my care of the patient were reviewed by me and considered in my medical decision making (see chart for details).  The patient is well-appearing, she is in no acute distress, vital signs are stable.  Reviewed the patient's chart, specifically looking at the imaging that was performed while she was in the hospital.  MRI was performed of her lower back, with x-rays of her pelvis and right foot.  Patient does have fractures in the third and fourth toes of the right foot.  MRI of her spine also shows degenerative changes along with multilevel neuroforaminal stenosis and some nerve root involvement at S1.  Patient's son was advised that the patient did have an appointment today at [facility] for her foot.  He states "I did not know".  Patient also has appointment scheduled for primary care, orthopedic surgery and for neurology.  Patient and son report that they were not aware of the scheduled appointments.  Patient and son were provided AVS for the patient's inpatient admission.  For the patient's neuropathic pain, gabapentin 100 mg 3 times daily was prescribed to help with her pain.  Supportive care recommendations were provided and discussed with the patient to include exercise of the lower extremities, and to follow-up with the specialist provided on her AVS for further evaluation.  Patient signed was also advised to consider following up with home health for additional resources.  Patient and son were given strict ER follow-up precautions.  Patient and son verbalized understanding, with all questions answered.  Patient is stable for discharge.  Final Clinical Impressions(s) / UC Diagnoses   Final diagnoses:  Weakness of both lower extremities  Lower extremity pain, bilateral     Discharge Instructions      As discussed, it is recommended that you follow-up with orthopedics, orthopedic surgery, and neurology.  Please call and reschedule your appointment as soon as possible. Elevate the lower extremities is much as possible while symptoms persist. Recommend exercising the lower extremities while you are in bed is much as possible to improve circulation. If you become unable to walk, with worsening numbness, tingling, or pain, please go to the emergency department immediately for further evaluation. I am providing a copy of your after visit summary that was provided to you at the time of your hospital discharge.  Please follow-up with the providers on the list for further evaluation. Follow-up as needed.    ED Prescriptions  Medication Sig Dispense Auth. Provider   gabapentin (NEURONTIN) 100 MG capsule Take 1 capsule (100 mg total) by mouth 3 (three) times daily  for 10 days. 30 capsule Jefferey Lippmann-Warren, Sadie Haber, NP      PDMP not reviewed this encounter.   Abran Cantor, NP 06/21/22 1528

## 2022-06-22 ENCOUNTER — Ambulatory Visit: Payer: Medicare HMO | Admitting: Neurology

## 2022-06-22 DIAGNOSIS — M5136 Other intervertebral disc degeneration, lumbar region: Secondary | ICD-10-CM | POA: Diagnosis not present

## 2022-06-22 DIAGNOSIS — N390 Urinary tract infection, site not specified: Secondary | ICD-10-CM | POA: Diagnosis not present

## 2022-06-22 DIAGNOSIS — G9389 Other specified disorders of brain: Secondary | ICD-10-CM | POA: Diagnosis not present

## 2022-06-22 DIAGNOSIS — S92511D Displaced fracture of proximal phalanx of right lesser toe(s), subsequent encounter for fracture with routine healing: Secondary | ICD-10-CM | POA: Diagnosis not present

## 2022-06-22 DIAGNOSIS — E039 Hypothyroidism, unspecified: Secondary | ICD-10-CM | POA: Diagnosis not present

## 2022-06-22 DIAGNOSIS — M9973 Connective tissue and disc stenosis of intervertebral foramina of lumbar region: Secondary | ICD-10-CM | POA: Diagnosis not present

## 2022-06-22 DIAGNOSIS — F419 Anxiety disorder, unspecified: Secondary | ICD-10-CM | POA: Diagnosis not present

## 2022-06-22 DIAGNOSIS — I10 Essential (primary) hypertension: Secondary | ICD-10-CM | POA: Diagnosis not present

## 2022-06-22 DIAGNOSIS — G8929 Other chronic pain: Secondary | ICD-10-CM | POA: Diagnosis not present

## 2022-06-24 DIAGNOSIS — G8929 Other chronic pain: Secondary | ICD-10-CM | POA: Diagnosis not present

## 2022-06-24 DIAGNOSIS — G9389 Other specified disorders of brain: Secondary | ICD-10-CM | POA: Diagnosis not present

## 2022-06-24 DIAGNOSIS — M5136 Other intervertebral disc degeneration, lumbar region: Secondary | ICD-10-CM | POA: Diagnosis not present

## 2022-06-24 DIAGNOSIS — E039 Hypothyroidism, unspecified: Secondary | ICD-10-CM | POA: Diagnosis not present

## 2022-06-24 DIAGNOSIS — I10 Essential (primary) hypertension: Secondary | ICD-10-CM | POA: Diagnosis not present

## 2022-06-24 DIAGNOSIS — F419 Anxiety disorder, unspecified: Secondary | ICD-10-CM | POA: Diagnosis not present

## 2022-06-24 DIAGNOSIS — S92511D Displaced fracture of proximal phalanx of right lesser toe(s), subsequent encounter for fracture with routine healing: Secondary | ICD-10-CM | POA: Diagnosis not present

## 2022-06-24 DIAGNOSIS — N390 Urinary tract infection, site not specified: Secondary | ICD-10-CM | POA: Diagnosis not present

## 2022-06-24 DIAGNOSIS — M9973 Connective tissue and disc stenosis of intervertebral foramina of lumbar region: Secondary | ICD-10-CM | POA: Diagnosis not present

## 2022-06-25 ENCOUNTER — Telehealth: Payer: Self-pay | Admitting: Family Medicine

## 2022-06-25 DIAGNOSIS — I10 Essential (primary) hypertension: Secondary | ICD-10-CM | POA: Diagnosis not present

## 2022-06-25 DIAGNOSIS — G8929 Other chronic pain: Secondary | ICD-10-CM | POA: Diagnosis not present

## 2022-06-25 DIAGNOSIS — G9389 Other specified disorders of brain: Secondary | ICD-10-CM | POA: Diagnosis not present

## 2022-06-25 DIAGNOSIS — M9973 Connective tissue and disc stenosis of intervertebral foramina of lumbar region: Secondary | ICD-10-CM | POA: Diagnosis not present

## 2022-06-25 DIAGNOSIS — N390 Urinary tract infection, site not specified: Secondary | ICD-10-CM | POA: Diagnosis not present

## 2022-06-25 DIAGNOSIS — S92511D Displaced fracture of proximal phalanx of right lesser toe(s), subsequent encounter for fracture with routine healing: Secondary | ICD-10-CM | POA: Diagnosis not present

## 2022-06-25 DIAGNOSIS — M5136 Other intervertebral disc degeneration, lumbar region: Secondary | ICD-10-CM | POA: Diagnosis not present

## 2022-06-25 DIAGNOSIS — F419 Anxiety disorder, unspecified: Secondary | ICD-10-CM | POA: Diagnosis not present

## 2022-06-25 DIAGNOSIS — E039 Hypothyroidism, unspecified: Secondary | ICD-10-CM | POA: Diagnosis not present

## 2022-06-25 NOTE — Telephone Encounter (Signed)
Called for Verbal orders for pt.

## 2022-06-25 NOTE — Telephone Encounter (Signed)
Spoke with [facility], gave okay for verbal orders per Iliana.  

## 2022-06-25 NOTE — Telephone Encounter (Signed)
Spoke with Frances Furbish, gave okay for verbal orders per Tenna Child.

## 2022-06-29 ENCOUNTER — Encounter: Payer: Self-pay | Admitting: Internal Medicine

## 2022-06-29 ENCOUNTER — Ambulatory Visit (INDEPENDENT_AMBULATORY_CARE_PROVIDER_SITE_OTHER): Payer: Medicare HMO | Admitting: Internal Medicine

## 2022-06-29 VITALS — BP 154/84 | HR 77 | Ht 60.0 in | Wt 175.0 lb

## 2022-06-29 DIAGNOSIS — F419 Anxiety disorder, unspecified: Secondary | ICD-10-CM | POA: Diagnosis not present

## 2022-06-29 DIAGNOSIS — M79605 Pain in left leg: Secondary | ICD-10-CM | POA: Diagnosis not present

## 2022-06-29 DIAGNOSIS — I10 Essential (primary) hypertension: Secondary | ICD-10-CM | POA: Diagnosis not present

## 2022-06-29 DIAGNOSIS — M79604 Pain in right leg: Secondary | ICD-10-CM

## 2022-06-29 DIAGNOSIS — S92511D Displaced fracture of proximal phalanx of right lesser toe(s), subsequent encounter for fracture with routine healing: Secondary | ICD-10-CM | POA: Diagnosis not present

## 2022-06-29 DIAGNOSIS — G8929 Other chronic pain: Secondary | ICD-10-CM | POA: Diagnosis not present

## 2022-06-29 DIAGNOSIS — E039 Hypothyroidism, unspecified: Secondary | ICD-10-CM | POA: Diagnosis not present

## 2022-06-29 DIAGNOSIS — M9973 Connective tissue and disc stenosis of intervertebral foramina of lumbar region: Secondary | ICD-10-CM | POA: Diagnosis not present

## 2022-06-29 DIAGNOSIS — G609 Hereditary and idiopathic neuropathy, unspecified: Secondary | ICD-10-CM

## 2022-06-29 DIAGNOSIS — S92911D Unspecified fracture of right toe(s), subsequent encounter for fracture with routine healing: Secondary | ICD-10-CM | POA: Diagnosis not present

## 2022-06-29 DIAGNOSIS — N390 Urinary tract infection, site not specified: Secondary | ICD-10-CM | POA: Diagnosis not present

## 2022-06-29 DIAGNOSIS — M5136 Other intervertebral disc degeneration, lumbar region: Secondary | ICD-10-CM | POA: Diagnosis not present

## 2022-06-29 DIAGNOSIS — G9389 Other specified disorders of brain: Secondary | ICD-10-CM | POA: Diagnosis not present

## 2022-06-29 MED ORDER — GABAPENTIN 100 MG PO CAPS
100.0000 mg | ORAL_CAPSULE | Freq: Three times a day (TID) | ORAL | 0 refills | Status: DC
Start: 2022-06-29 — End: 2022-06-30

## 2022-06-29 NOTE — Progress Notes (Unsigned)
Established Patient Office Visit  Subjective   Patient ID: Abrial Dellapenna, female    DOB: May 22, 1933  Age: 89 y.o. MRN: [id number]  Chief Complaint  Patient presents with   Transitions Of Care   Ms. Zirkle presents to [facility] today for hospital follow-up.  Recent admission 5/11 - 5/15 at [facility] following ED presentation for recurrent falls and generalized lower extremity weakness.  She fell the day prior to her admission (5/10) found to have fractures in the third and fourth digits of the right foot.  UA concerning for UTI.  Toe fractures of the right foot treated with postop shoe and WBAT.  Bilateral lower extremity weakness workup, including MRI, largely unremarkable.  Home PT/OT recommended.  Antihypertensive medications were adjusted, including amlodipine increased to 10 mg daily as well as HCTZ increased to 25 mg daily.  ER presentation 5/20 due to persistent bilateral lower extremity weakness/pain.  Past Medical History:  Diagnosis Date   Anxiety    Depression    Essential hypertension    H/O back injury 2016   Past Surgical History:  Procedure Laterality Date   ABDOMINAL HYSTERECTOMY     total   CATARACT EXTRACTION, BILATERAL  01/2020   CHOLECYSTECTOMY     NECK SURGERY     Social History   Tobacco Use   Smoking status: Never   Smokeless tobacco: Never  Vaping Use   Vaping Use: Never used  Substance Use Topics   Alcohol use: Yes    Comment: 1-2 drinks per 6 months   Drug use: Never   Family History  Problem Relation Age of Onset   Diabetes Mother    Diabetes Father    Diabetes Sister    Allergies  Allergen Reactions   Amoxicillin Hives   Augmentin [Amoxicillin-Pot Clavulanate] Hives      ROS    Objective:     BP (!) 154/84   Pulse 77   Ht 5' (1.524 m)   Wt 175 lb (79.4 kg)   SpO2 97%   BMI 34.18 kg/m  BP Readings from Last 3 Encounters:  06/29/22 (!) 154/84  06/21/22 (!) 143/82  06/16/22 (!) 179/54      Physical  Exam   No results found for any visits on 06/29/22.  Last CBC Lab Results  Component Value Date   WBC 10.5 06/14/2022   HGB 12.0 06/14/2022   HCT 37.0 06/14/2022   MCV 94.9 06/14/2022   MCH 30.8 06/14/2022   RDW 13.1 06/14/2022   PLT 301 06/14/2022   Last metabolic panel Lab Results  Component Value Date   GLUCOSE 111 (H) 06/14/2022   NA 137 06/14/2022   K 4.3 06/14/2022   CL 110 06/14/2022   CO2 23 06/14/2022   BUN 19 06/14/2022   CREATININE 0.66 06/14/2022   GFRNONAA >60 06/14/2022   CALCIUM 8.5 (L) 06/14/2022   PROT 6.5 10/22/2021   ALBUMIN 4.1 10/22/2021   LABGLOB 2.4 10/22/2021   AGRATIO 1.7 10/22/2021   BILITOT 0.3 10/22/2021   ALKPHOS 79 10/22/2021   AST 50 (H) 10/22/2021   ALT 43 (H) 10/22/2021   ANIONGAP 4 (L) 06/14/2022   Last lipids Lab Results  Component Value Date   CHOL 167 10/01/2020   HDL 45 10/01/2020   LDLCALC 102 (H) 10/01/2020   TRIG 108 10/01/2020   CHOLHDL 3.7 10/01/2020   Last hemoglobin A1c Lab Results  Component Value Date   HGBA1C 6.1 (H) 10/22/2021   Last thyroid functions Lab Results  Component Value Date   TSH 2.666 06/13/2022   T4TOTAL 8.5 12/29/2020   Last vitamin D Lab Results  Component Value Date   VD25OH 16.0 (L) 10/22/2021   Last vitamin B12 and Folate Lab Results  Component Value Date   VITAMINB12 349 10/22/2021     Assessment & Plan:   Problem List Items Addressed This Visit   None   No follow-ups on file.    Billie Lade, MD

## 2022-06-29 NOTE — Patient Instructions (Signed)
It was a pleasure to see you today.  Thank you for giving [place] the opportunity to be involved in your care.  Below is a brief recap of your visit and next steps.  We will plan to see you again in 2 weeks.  Summary I recommend starting gabapentin today  Neurology appointment scheduled for tomorrow Follow up in 2 weeks

## 2022-06-30 ENCOUNTER — Encounter: Payer: Self-pay | Admitting: Neurology

## 2022-06-30 ENCOUNTER — Ambulatory Visit: Payer: Medicare HMO | Admitting: Neurology

## 2022-06-30 VITALS — BP 134/70 | Ht 60.0 in | Wt 175.0 lb

## 2022-06-30 DIAGNOSIS — G8929 Other chronic pain: Secondary | ICD-10-CM

## 2022-06-30 DIAGNOSIS — M79604 Pain in right leg: Secondary | ICD-10-CM | POA: Insufficient documentation

## 2022-06-30 DIAGNOSIS — G609 Hereditary and idiopathic neuropathy, unspecified: Secondary | ICD-10-CM | POA: Diagnosis not present

## 2022-06-30 DIAGNOSIS — E039 Hypothyroidism, unspecified: Secondary | ICD-10-CM | POA: Diagnosis not present

## 2022-06-30 DIAGNOSIS — I10 Essential (primary) hypertension: Secondary | ICD-10-CM | POA: Diagnosis not present

## 2022-06-30 DIAGNOSIS — G9389 Other specified disorders of brain: Secondary | ICD-10-CM | POA: Diagnosis not present

## 2022-06-30 DIAGNOSIS — F419 Anxiety disorder, unspecified: Secondary | ICD-10-CM | POA: Diagnosis not present

## 2022-06-30 DIAGNOSIS — N390 Urinary tract infection, site not specified: Secondary | ICD-10-CM | POA: Diagnosis not present

## 2022-06-30 DIAGNOSIS — G500 Trigeminal neuralgia: Secondary | ICD-10-CM | POA: Diagnosis not present

## 2022-06-30 DIAGNOSIS — S92911A Unspecified fracture of right toe(s), initial encounter for closed fracture: Secondary | ICD-10-CM | POA: Insufficient documentation

## 2022-06-30 DIAGNOSIS — M9973 Connective tissue and disc stenosis of intervertebral foramina of lumbar region: Secondary | ICD-10-CM | POA: Diagnosis not present

## 2022-06-30 DIAGNOSIS — M545 Low back pain, unspecified: Secondary | ICD-10-CM

## 2022-06-30 DIAGNOSIS — M5136 Other intervertebral disc degeneration, lumbar region: Secondary | ICD-10-CM | POA: Diagnosis not present

## 2022-06-30 DIAGNOSIS — S92511D Displaced fracture of proximal phalanx of right lesser toe(s), subsequent encounter for fracture with routine healing: Secondary | ICD-10-CM | POA: Diagnosis not present

## 2022-06-30 MED ORDER — DULOXETINE HCL 20 MG PO CPEP
20.0000 mg | ORAL_CAPSULE | Freq: Every day | ORAL | 6 refills | Status: DC
Start: 2022-06-30 — End: 2022-07-15

## 2022-06-30 MED ORDER — GABAPENTIN 100 MG PO CAPS
100.0000 mg | ORAL_CAPSULE | Freq: Three times a day (TID) | ORAL | 6 refills | Status: DC
Start: 2022-06-30 — End: 2022-07-06

## 2022-06-30 MED ORDER — VITAMIN B-12 1000 MCG PO TABS: 1000.0000 ug | ORAL_TABLET | Freq: Every day | ORAL | 1 refills | Status: AC

## 2022-06-30 NOTE — Assessment & Plan Note (Signed)
Current antihypertensive regimen consists of amlodipine 10 mg daily, HCTZ 25 mg daily, and lisinopril 40 mg daily.  BP remains mildly elevated today, but is overall improved from one month ago. -No medication changes today.  BP remains mildly elevated, however I suspect this is at least partially attributable to significant discomfort today.  We will tentatively plan for follow-up in 2 weeks for reassessment.  If BP remains elevated at that time, consider escalation of antihypertensive therapy.

## 2022-06-30 NOTE — Progress Notes (Signed)
[facility]  PATIENT: Nancy Schroeder DOB: 1933/11/18  REQUESTING CLINICIAN: Anabel Halon, MD HISTORY FROM: Patient and son  REASON FOR VISIT: Headaches    HISTORICAL  CHIEF COMPLAINT:  Chief Complaint  Patient presents with   Follow-up    Rm 13, with son Zollie Beckers, sever neuropathic pain in legs, frequent falls   INTERVAL HISTORY 06/30/2022:  Patient presents today for follow-up, she is accompanied by her son.  Last visit was in June of last year.  Since then she reports that she has been okay in terms of the trigeminal neuralgia but lately her neuropathy has been getting worse.  She reported burning pain in the bilateral legs, stated that nothing can touch her leg.  Her gown or the sheets at night.  She reports that her PCP started her on gabapentin and she took the medication for the first time yesterday.  On top of that patient self discontinued her medications including carbamazepine, duloxetine 2 weeks ago.  She was recently seen in the hospital for UTI, and currently undergoing home PT.  Again her pain is constant and worse at night.   INTERVAL HISTORY 07/22/21:  Patient present today for follow-up, at last visit we started him on her on carbamazepine 300 mg twice daily but she said that medication was so strong therefore she decreased it to 200 mg in the morning and if needed 100 mg in the afternoon.  With that dose it seems to control her pain from the trigeminal neuralgia.  She does also report history of back pain and radiculopathy but states that the carbamazepine helps with the radicular pain.  We also checked her MRI brain and MRA head with no significant abnormality.    HISTORY OF PRESENT ILLNESS:  This is a 89 year old woman with past medical history of anxiety, ?hypothyroidism, hypertension and cataract who is presenting with headache.  Patient said that she had cataract surgery in January 2022, following surgery she started developing lower jaw  sharp pain.  Patient said the pain occurs daily, sometime multiple times per day.  It involves the lower jaw sometimes the maxillary area also.  Pain described as sharp shooting pain lasting for few seconds and go away.  On top of this pain, she also have frontal dull headache that can last hours.  She reported both headaches are new since her surgery.  She has been taking Tylenol and ibuprofen with minimal relief.  Has not been on a preventive medical.  She denies any family history of headaches, and denies any previous history of headaches.   Headache History and Characteristics: Onset: January 2022 Location: Frontal headaches and right side lower jaw sharp pain  Quality:  aching and burning  Intensity: 7-8/10.  Duration: last last a few sec (jaw pain) or hours (dull frontal headaches)  Migrainous Features: No Aura: No  History of brain injury or tumor: No  Family history: No family history of headaches  Motion sickness: no Cardiac history: no  OTC: tylenol, Ibuprofen  Caffeine: 1 to 2 cups of coffee in the morning Sleep: 4 to 5  hrs sleep  Mood/ Stress: Very stress and anxious about her headaches and health in general.   Prior prophylaxis: Propranolol: No  Verapamil:No TCA: No Topamax: No Depakote: No Effexor: No Cymbalta: No Neurontin:No  Prior abortives: Triptan: No Anti-emetic: No Steroids: No Ergotamine suppository: No    OTHER MEDICAL CONDITIONS: HTN, Cataracts, Anxiety    REVIEW OF SYSTEMS: Full 14 system review of systems performed  and negative with exception of: as noted in the HPI   ALLERGIES: Allergies  Allergen Reactions   Amoxicillin Hives   Augmentin [Amoxicillin-Pot Clavulanate] Hives    HOME MEDICATIONS: Outpatient Medications Prior to Visit  Medication Sig Dispense Refill   aspirin EC 81 MG tablet Take 81 mg by mouth daily. Swallow whole.     busPIRone (BUSPAR) 10 MG tablet Take 1 tablet (10 mg total) by mouth daily. 30 tablet 1    hydrochlorothiazide (HYDRODIURIL) 25 MG tablet Take 1 tablet (25 mg total) by mouth daily. Hypertension 30 tablet 3   lisinopril (ZESTRIL) 40 MG tablet Take 1 tablet (40 mg total) by mouth daily. Hypertension 90 tablet 1   gabapentin (NEURONTIN) 100 MG capsule Take 1 capsule (100 mg total) by mouth 3 (three) times daily for 10 days. 30 capsule 0   amLODipine (NORVASC) 5 MG tablet Take 2 tablets (10 mg total) by mouth daily. Hypertension (Patient not taking: Reported on 06/30/2022) 60 tablet 3   carbamazepine (TEGRETOL) 200 MG tablet Take 1 tablet (200 mg total) by mouth in the morning and at bedtime. (Patient not taking: Reported on 06/29/2022) 60 tablet 3   levothyroxine (SYNTHROID) 25 MCG tablet Take 1 tablet (25 mcg total) by mouth daily before breakfast. (Patient not taking: Reported on 06/30/2022) 90 tablet 0   DULoxetine (CYMBALTA) 20 MG capsule Take 1 capsule (20 mg total) by mouth daily. (Patient not taking: Reported on 06/13/2022) 30 capsule 3   No facility-administered medications prior to visit.    PAST MEDICAL HISTORY: Past Medical History:  Diagnosis Date   Anxiety    Depression    Essential hypertension    H/O back injury 2016    PAST SURGICAL HISTORY: Past Surgical History:  Procedure Laterality Date   ABDOMINAL HYSTERECTOMY     total   CATARACT EXTRACTION, BILATERAL  01/2020   CHOLECYSTECTOMY     NECK SURGERY      FAMILY HISTORY: Family History  Problem Relation Age of Onset   Diabetes Mother    Diabetes Father    Diabetes Sister     SOCIAL HISTORY: Social History   Socioeconomic History   Marital status: Widowed    Spouse name: Not on file   Number of children: 3   Years of education: Not on file   Highest education level: 8th grade  Occupational History   Occupation: retired  Tobacco Use   Smoking status: Never   Smokeless tobacco: Never  Vaping Use   Vaping Use: Never used  Substance and Sexual Activity   Alcohol use: Yes    Comment: 1-2 drinks  per 6 months   Drug use: Never   Sexual activity: Not Currently    Birth control/protection: Surgical, Post-menopausal  Other Topics Concern   Not on file  Social History Narrative   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 Read, tv, putt around, likes astrology      Diet: Eats what she wants. Enjoys veggies and fruits   Caffeine: 1-2 cups of coffee, iced tea   Water: 6-8 cups daily       Wear seatbelt   Does not wear sunscreen   Smoke and carbon monoxide detectors         Social  Determinants of Health   Financial Resource Strain: Medium Risk (08/29/2020)   Overall Financial Resource Strain (CARDIA)    Difficulty of Paying Living Expenses: Somewhat hard  Food Insecurity: No Food Insecurity (06/17/2022)   Hunger Vital Sign    Worried About Running Out of Food in the Last Year: Never true    Ran Out of Food in the Last Year: Never true  Transportation Needs: No Transportation Needs (06/17/2022)   PRAPARE - Administrator, Civil Service (Medical): No    Lack of Transportation (Non-Medical): No  Physical Activity: Inactive (09/14/2021)   Exercise Vital Sign    Days of Exercise per Week: 0 days    Minutes of Exercise per Session: 0 min  Stress: No Stress Concern Present (08/29/2020)   [employer] of Occupational Health - Occupational Stress Questionnaire    Feeling of Stress : Not at all  Social Connections: Socially Isolated (08/29/2020)   Social Connection and Isolation Panel [NHANES]    Frequency of Communication with Friends and Family: Once a week    Frequency of Social Gatherings with Friends and Family: Once a week    Attends Religious Services: Never    Database administrator or Organizations: No    Attends Banker Meetings: Never    Marital  Status: Widowed  Intimate Partner Violence: Not At Risk (06/12/2022)   Humiliation, Afraid, Rape, and Kick questionnaire    Fear of Current or Ex-Partner: No    Emotionally Abused: No    Physically Abused: No    Sexually Abused: No    PHYSICAL EXAM  GENERAL EXAM/CONSTITUTIONAL: Vitals:  Vitals:   06/30/22 0739  BP: 134/70  Weight: 175 lb (79.4 kg)  Height: 5' (1.524 m)   Body mass index is 34.18 kg/m. Wt Readings from Last 3 Encounters:  06/30/22 175 lb (79.4 kg)  06/29/22 175 lb (79.4 kg)  06/12/22 176 lb (79.8 kg)   Patient is in no distress; well developed, nourished and groomed; neck is supple  MUSCULOSKELETAL: Gait, strength, tone, movements noted in Neurologic exam below  NEUROLOGIC: MENTAL STATUS:     02/06/2019    1:11 PM  MMSE - Mini Mental State Exam  Orientation to time 5  Orientation to Place 5  Registration 3  Attention/ Calculation 5  Recall 3  Language- name 2 objects 2  Language- repeat 1  Language- follow 3 step command 3  Language- read & follow direction 1  Write a sentence 1  Copy design 1  Total score 30   awake, alert, oriented to person, place and time recent and remote memory intact normal attention and concentration language fluent, comprehension intact, naming intact fund of knowledge appropriate  CRANIAL NERVE:  2nd, 3rd, 4th, 6th -visual fields full to confrontation, extraocular muscles intact, no nystagmus 5th - facial sensation symmetric 7th - facial strength symmetric 8th - hearing intact 9th - palate elevates symmetrically, uvula midline 11th - shoulder shrug symmetric 12th - tongue protrusion midline  MOTOR:  normal bulk and tone, full strength in the BUE, BLE  SENSORY:  normal and symmetric to light touch  COORDINATION:  finger-nose-finger, fine finger movements normal  REFLEXES:  deep tendon reflexes present and symmetric  GAIT/STATION:  Deferred, on a wheelchair    DIAGNOSTIC DATA (LABS, IMAGING,  TESTING) - I reviewed patient records, labs, notes, testing and imaging myself where available.  Lab Results  Component Value Date   WBC 10.5 06/14/2022   HGB 12.0 06/14/2022  HCT 37.0 06/14/2022   MCV 94.9 06/14/2022   PLT 301 06/14/2022      Component Value Date/Time   NA 137 06/14/2022 0448   NA 143 10/22/2021 1349   K 4.3 06/14/2022 0448   CL 110 06/14/2022 0448   CO2 23 06/14/2022 0448   GLUCOSE 111 (H) 06/14/2022 0448   BUN 19 06/14/2022 0448   BUN 9 10/22/2021 1349   CREATININE 0.66 06/14/2022 0448   CREATININE 0.82 03/04/2020 1736   CALCIUM 8.5 (L) 06/14/2022 0448   PROT 6.5 10/22/2021 1349   ALBUMIN 4.1 10/22/2021 1349   AST 50 (H) 10/22/2021 1349   ALT 43 (H) 10/22/2021 1349   ALKPHOS 79 10/22/2021 1349   BILITOT 0.3 10/22/2021 1349   GFRNONAA >60 06/14/2022 0448   GFRNONAA 66 03/04/2020 1736   GFRAA 76 03/04/2020 1736   Lab Results  Component Value Date   CHOL 167 10/01/2020   HDL 45 10/01/2020   LDLCALC 102 (H) 10/01/2020   TRIG 108 10/01/2020   CHOLHDL 3.7 10/01/2020   Lab Results  Component Value Date   HGBA1C 6.1 (H) 10/22/2021   Lab Results  Component Value Date   VITAMINB12 349 10/22/2021   Lab Results  Component Value Date   TSH 2.666 06/13/2022   MRI/MRA Brain 01/08/21 No evidence of recent infarction, hemorrhage, or mass. Mild chronic microvascular ischemic changes. Few chronic infarcts detailed above. No occlusion or significant stenosis   ASSESSMENT AND PLAN  89 y.o. year old female with anxiety, hypertension, history of cataract surgery, previously diagnosed with hypothyroidism who is presenting for follow-up for trigeminal pain and worsening neuropathic pain.  For her trigeminal neuralgia, the pain is controlled on carbamazepine 200 mg in the morning and at time 200 mg in the afternoon.  Will continue patient on the same regimen.  In terms of the neuropathy pain.  I have advised patient to restart her Cymbalta, continue the  gabapentin 100 mg 3 times daily.  I will also recommend her to try lidocaine cream to decrease some of the pain.  Her B12 was noted to be slightly low at 349, we will give her a B12 supplement to take daily for the next 6 months.  Advised son to contact me if her pain is not resolved otherwise I will see her in 6 months for follow-up.   1. Trigeminal neuralgia   2. Idiopathic peripheral neuropathy   3. Chronic bilateral low back pain, unspecified whether sciatica present      Patient Instructions  Continue with Gabapentin 100 mg three time daily, can increase to 200 mg three time daily as needed  Restart Cymbalta 20 mg daily  Continue with Carbamazepine  Start Lidocaine cream 4 or 5% B12 supplement to take daily.  Contact us if worse  Return in 6 months    No orders of the defined types were placed in this encounter.   Meds ordered this encounter  Medications   gabapentin (NEURONTIN) 100 MG capsule    Sig: Take 1 capsule (100 mg total) by mouth 3 (three) times daily.    Dispense:  90 capsule    Refill:  6   DULoxetine (CYMBALTA) 20 MG capsule    Sig: Take 1 capsule (20 mg total) by mouth daily.    Dispense:  30 capsule    Refill:  6   cyanocobalamin (VITAMIN B12) 1000 MCG tablet    Sig: Take 1 tablet (1,000 mcg total) by mouth daily.    Dispense:  90 tablet    Refill:  1    Return in about 6 months (around 12/31/2022).   Windell Norfolk, MD 06/30/2022, 9:09 AM  [facility] [address] [place], [place] [address] [phone]

## 2022-06-30 NOTE — Assessment & Plan Note (Signed)
Recent fall 5/10 with fractures to the third and fourth digits of the right foot.  Evaluated by orthopedic surgery.  Treated with postop shoe and WBAT.  Reports today that her pain has resolved.  Minimal residual bruising is present.  States that she does not plan to follow-up with orthopedic surgery given significant improvement. -Orthopedic surgery follow-up recommended

## 2022-06-30 NOTE — Patient Instructions (Addendum)
Continue with Gabapentin 100 mg three time daily, can increase to 200 mg three time daily as needed  Restart Cymbalta 20 mg daily  Continue with Carbamazepine  Start Lidocaine cream 4 or 5% B12 supplement to take daily.  Contact us if worse  Return in 6 months

## 2022-06-30 NOTE — Assessment & Plan Note (Signed)
Presenting today for hospital follow-up in the setting of recent admission for bilateral lower extremity weakness and pain.  Workup, including MRI, was largely unremarkable.  Etiology remains unclear, though I suspect her symptoms are related to neuropathy  vs PAD based on her description of symptoms.  She has previously been prescribed Cymbalta and gabapentin but has not taken these medications.  She is also prescribed Tegretol for trigeminal neuralgia but stopped taking medication 3 weeks ago due to adverse side effects.  Today she describes burning, stabbing like pains in her lower extremities that are worse at night.  She alternates hanging her legs off of her bed and placing them under the sheets.  -Neurology follow-up scheduled for tomorrow (5/29). -I have recommended that she try gabapentin as previously prescribed.  Rx sent again today -Follow-up in 2 weeks for reassessment

## 2022-07-01 DIAGNOSIS — N390 Urinary tract infection, site not specified: Secondary | ICD-10-CM | POA: Diagnosis not present

## 2022-07-01 DIAGNOSIS — F419 Anxiety disorder, unspecified: Secondary | ICD-10-CM | POA: Diagnosis not present

## 2022-07-01 DIAGNOSIS — E039 Hypothyroidism, unspecified: Secondary | ICD-10-CM | POA: Diagnosis not present

## 2022-07-01 DIAGNOSIS — S92511D Displaced fracture of proximal phalanx of right lesser toe(s), subsequent encounter for fracture with routine healing: Secondary | ICD-10-CM | POA: Diagnosis not present

## 2022-07-01 DIAGNOSIS — M5136 Other intervertebral disc degeneration, lumbar region: Secondary | ICD-10-CM | POA: Diagnosis not present

## 2022-07-01 DIAGNOSIS — G8929 Other chronic pain: Secondary | ICD-10-CM | POA: Diagnosis not present

## 2022-07-01 DIAGNOSIS — M9973 Connective tissue and disc stenosis of intervertebral foramina of lumbar region: Secondary | ICD-10-CM | POA: Diagnosis not present

## 2022-07-01 DIAGNOSIS — G9389 Other specified disorders of brain: Secondary | ICD-10-CM | POA: Diagnosis not present

## 2022-07-01 DIAGNOSIS — I10 Essential (primary) hypertension: Secondary | ICD-10-CM | POA: Diagnosis not present

## 2022-07-05 ENCOUNTER — Ambulatory Visit: Payer: Medicare HMO | Admitting: Orthopedic Surgery

## 2022-07-05 ENCOUNTER — Telehealth: Payer: Self-pay | Admitting: Neurology

## 2022-07-05 DIAGNOSIS — M5136 Other intervertebral disc degeneration, lumbar region: Secondary | ICD-10-CM | POA: Diagnosis not present

## 2022-07-05 DIAGNOSIS — M9973 Connective tissue and disc stenosis of intervertebral foramina of lumbar region: Secondary | ICD-10-CM | POA: Diagnosis not present

## 2022-07-05 DIAGNOSIS — G9389 Other specified disorders of brain: Secondary | ICD-10-CM | POA: Diagnosis not present

## 2022-07-05 DIAGNOSIS — S92511D Displaced fracture of proximal phalanx of right lesser toe(s), subsequent encounter for fracture with routine healing: Secondary | ICD-10-CM | POA: Diagnosis not present

## 2022-07-05 DIAGNOSIS — E039 Hypothyroidism, unspecified: Secondary | ICD-10-CM | POA: Diagnosis not present

## 2022-07-05 DIAGNOSIS — G8929 Other chronic pain: Secondary | ICD-10-CM | POA: Diagnosis not present

## 2022-07-05 DIAGNOSIS — N390 Urinary tract infection, site not specified: Secondary | ICD-10-CM | POA: Diagnosis not present

## 2022-07-05 DIAGNOSIS — I10 Essential (primary) hypertension: Secondary | ICD-10-CM | POA: Diagnosis not present

## 2022-07-05 DIAGNOSIS — F419 Anxiety disorder, unspecified: Secondary | ICD-10-CM | POA: Diagnosis not present

## 2022-07-05 NOTE — Telephone Encounter (Signed)
Called and LVM per DPR on Dr. Karie Georges last note and stated for them to call back with any additional concerns.

## 2022-07-05 NOTE — Telephone Encounter (Signed)
Pt son is asking for a call to discuss increasing the MG of the gabapentin (NEURONTIN) 100 MG capsule

## 2022-07-06 ENCOUNTER — Other Ambulatory Visit: Payer: Self-pay | Admitting: Neurology

## 2022-07-06 DIAGNOSIS — G609 Hereditary and idiopathic neuropathy, unspecified: Secondary | ICD-10-CM

## 2022-07-06 MED ORDER — GABAPENTIN 300 MG PO CAPS
300.0000 mg | ORAL_CAPSULE | Freq: Three times a day (TID) | ORAL | 1 refills | Status: DC
Start: 2022-07-06 — End: 2022-11-19

## 2022-07-06 NOTE — Telephone Encounter (Signed)
Lets increase to 300 mg TID, but they need to monitor for side effects including drowsiness and sleepiness.

## 2022-07-06 NOTE — Telephone Encounter (Signed)
Would like return call with recommendations to [phone]

## 2022-07-06 NOTE — Telephone Encounter (Signed)
Took incoming call from son who is stating that she has been taking 500mg  daily of the gabapentin, last note states 600mg  is acceptable. He states he will try that but also wants to inform provider that the patient is in pain and is unsure if one more pill will help. Would like advise on what next steps should be and also requesting a new prescription with 200mg  pills of gabapentin sent in if provider wants patient to continue taking.

## 2022-07-06 NOTE — Telephone Encounter (Signed)
Son has called back stating pt has been on oxycontin 100 mg, he would like to know if Dr Teresa Coombs will write a script for pt to continue on it, [name] can be reached at [phone]

## 2022-07-06 NOTE — Telephone Encounter (Signed)
Called and relayed message to son who was agreeable to trying the updated dosing and stated he would call if needed

## 2022-07-07 ENCOUNTER — Ambulatory Visit: Payer: Medicare HMO | Admitting: Podiatry

## 2022-07-07 DIAGNOSIS — M9973 Connective tissue and disc stenosis of intervertebral foramina of lumbar region: Secondary | ICD-10-CM | POA: Diagnosis not present

## 2022-07-07 DIAGNOSIS — I10 Essential (primary) hypertension: Secondary | ICD-10-CM | POA: Diagnosis not present

## 2022-07-07 DIAGNOSIS — S92511D Displaced fracture of proximal phalanx of right lesser toe(s), subsequent encounter for fracture with routine healing: Secondary | ICD-10-CM | POA: Diagnosis not present

## 2022-07-07 DIAGNOSIS — M5136 Other intervertebral disc degeneration, lumbar region: Secondary | ICD-10-CM | POA: Diagnosis not present

## 2022-07-07 DIAGNOSIS — G9389 Other specified disorders of brain: Secondary | ICD-10-CM | POA: Diagnosis not present

## 2022-07-07 DIAGNOSIS — E039 Hypothyroidism, unspecified: Secondary | ICD-10-CM | POA: Diagnosis not present

## 2022-07-07 DIAGNOSIS — N390 Urinary tract infection, site not specified: Secondary | ICD-10-CM | POA: Diagnosis not present

## 2022-07-07 DIAGNOSIS — G8929 Other chronic pain: Secondary | ICD-10-CM | POA: Diagnosis not present

## 2022-07-07 DIAGNOSIS — F419 Anxiety disorder, unspecified: Secondary | ICD-10-CM | POA: Diagnosis not present

## 2022-07-08 ENCOUNTER — Ambulatory Visit: Payer: Medicare HMO | Admitting: Internal Medicine

## 2022-07-08 DIAGNOSIS — N390 Urinary tract infection, site not specified: Secondary | ICD-10-CM | POA: Diagnosis not present

## 2022-07-08 DIAGNOSIS — E039 Hypothyroidism, unspecified: Secondary | ICD-10-CM | POA: Diagnosis not present

## 2022-07-08 DIAGNOSIS — F419 Anxiety disorder, unspecified: Secondary | ICD-10-CM | POA: Diagnosis not present

## 2022-07-08 DIAGNOSIS — M9973 Connective tissue and disc stenosis of intervertebral foramina of lumbar region: Secondary | ICD-10-CM | POA: Diagnosis not present

## 2022-07-08 DIAGNOSIS — I10 Essential (primary) hypertension: Secondary | ICD-10-CM | POA: Diagnosis not present

## 2022-07-08 DIAGNOSIS — G9389 Other specified disorders of brain: Secondary | ICD-10-CM | POA: Diagnosis not present

## 2022-07-08 DIAGNOSIS — G8929 Other chronic pain: Secondary | ICD-10-CM | POA: Diagnosis not present

## 2022-07-08 DIAGNOSIS — M5136 Other intervertebral disc degeneration, lumbar region: Secondary | ICD-10-CM | POA: Diagnosis not present

## 2022-07-08 DIAGNOSIS — S92511D Displaced fracture of proximal phalanx of right lesser toe(s), subsequent encounter for fracture with routine healing: Secondary | ICD-10-CM | POA: Diagnosis not present

## 2022-07-13 ENCOUNTER — Ambulatory Visit: Payer: Medicare HMO | Admitting: Family Medicine

## 2022-07-15 ENCOUNTER — Encounter: Payer: Self-pay | Admitting: Internal Medicine

## 2022-07-15 ENCOUNTER — Ambulatory Visit (INDEPENDENT_AMBULATORY_CARE_PROVIDER_SITE_OTHER): Payer: Medicare HMO | Admitting: Internal Medicine

## 2022-07-15 ENCOUNTER — Telehealth: Payer: Self-pay | Admitting: Family Medicine

## 2022-07-15 VITALS — BP 106/52 | HR 96 | Ht 60.0 in | Wt 172.0 lb

## 2022-07-15 DIAGNOSIS — M545 Low back pain, unspecified: Secondary | ICD-10-CM

## 2022-07-15 DIAGNOSIS — N390 Urinary tract infection, site not specified: Secondary | ICD-10-CM | POA: Diagnosis not present

## 2022-07-15 DIAGNOSIS — M5136 Other intervertebral disc degeneration, lumbar region: Secondary | ICD-10-CM | POA: Diagnosis not present

## 2022-07-15 DIAGNOSIS — G609 Hereditary and idiopathic neuropathy, unspecified: Secondary | ICD-10-CM | POA: Diagnosis not present

## 2022-07-15 DIAGNOSIS — I10 Essential (primary) hypertension: Secondary | ICD-10-CM | POA: Diagnosis not present

## 2022-07-15 DIAGNOSIS — F419 Anxiety disorder, unspecified: Secondary | ICD-10-CM | POA: Diagnosis not present

## 2022-07-15 DIAGNOSIS — G8929 Other chronic pain: Secondary | ICD-10-CM

## 2022-07-15 DIAGNOSIS — E039 Hypothyroidism, unspecified: Secondary | ICD-10-CM | POA: Diagnosis not present

## 2022-07-15 DIAGNOSIS — M9973 Connective tissue and disc stenosis of intervertebral foramina of lumbar region: Secondary | ICD-10-CM | POA: Diagnosis not present

## 2022-07-15 DIAGNOSIS — S92511D Displaced fracture of proximal phalanx of right lesser toe(s), subsequent encounter for fracture with routine healing: Secondary | ICD-10-CM | POA: Diagnosis not present

## 2022-07-15 DIAGNOSIS — G9389 Other specified disorders of brain: Secondary | ICD-10-CM | POA: Diagnosis not present

## 2022-07-15 MED ORDER — DULOXETINE HCL 20 MG PO CPEP
20.0000 mg | ORAL_CAPSULE | Freq: Every day | ORAL | 6 refills | Status: DC
Start: 2022-07-15 — End: 2023-06-08

## 2022-07-15 MED ORDER — LIDOCAINE 5 % EX CREA
1.0000 | TOPICAL_CREAM | Freq: Every evening | CUTANEOUS | 1 refills | Status: AC | PRN
Start: 2022-07-15 — End: ?

## 2022-07-15 MED ORDER — AMLODIPINE BESYLATE 5 MG PO TABS
5.0000 mg | ORAL_TABLET | Freq: Every day | ORAL | 3 refills | Status: DC
Start: 2022-07-15 — End: 2023-09-08

## 2022-07-15 NOTE — Telephone Encounter (Signed)
Denzil Magnuson physical therapist with [facility] called in on patient behalf  Patient has requested to dc PT   Requesting hoyer lift  Patient fall recovery Transfers Caregiver has existing back issues   Call back [phone]

## 2022-07-15 NOTE — Patient Instructions (Signed)
It was a pleasure to see you today.  Thank you for giving [place] the opportunity to be involved in your care.  Below is a brief recap of your visit and next steps.  We will plan to see you again in 2-4 weeks.  Summary Reduce amlodipine to 5 mg daily (one tablet per day) Lidocaine cream and duloxetine sent to [facility]. Follow up in 2-4 weeks.

## 2022-07-15 NOTE — Progress Notes (Signed)
Established Patient Office Visit  Subjective   Patient ID: Nancy Schroeder, female    DOB: 1933/03/16  Age: 89 y.o. MRN: [id number]  Chief Complaint  Patient presents with   Blood Pressure Check    2 week follow up   Nancy Schroeder returns to care today for HTN follow-up.  Nancy Schroeder was last evaluated by me on 5/28 for hospital follow-up in the setting of recent admission for bilateral lower extremity weakness and pain.  Her blood pressure was elevated at that time.  No medication changes were made until follow-up was arranged for reassessment.  In the interim Nancy Schroeder has been seen by neurology.  There have otherwise been no acute interval events.  Today Nancy Schroeder continues to endorse neuropathic pain in her legs.  Nancy Schroeder has not restarted gabapentin but it has not been effective.  Nancy Schroeder is interested in additional treatment options today.  Past Medical History:  Diagnosis Date   Anxiety    Depression    Essential hypertension    H/O back injury 2016   Past Surgical History:  Procedure Laterality Date   ABDOMINAL HYSTERECTOMY     total   CATARACT EXTRACTION, BILATERAL  01/2020   CHOLECYSTECTOMY     NECK SURGERY     Social History   Tobacco Use   Smoking status: Never   Smokeless tobacco: Never  Vaping Use   Vaping Use: Never used  Substance Use Topics   Alcohol use: Yes    Comment: 1-2 drinks per 6 months   Drug use: Never   Family History  Problem Relation Age of Onset   Diabetes Mother    Diabetes Father    Diabetes Sister    Allergies  Allergen Reactions   Amoxicillin Hives   Augmentin [Amoxicillin-Pot Clavulanate] Hives   Review of Systems  Neurological:  Positive for tingling, sensory change and weakness.  All other systems reviewed and are negative.    Objective:     BP (!) 106/52   Pulse 96   Ht 5' (1.524 m)   Wt 172 lb (78 kg)   SpO2 95%   BMI 33.59 kg/m  BP Readings from Last 3 Encounters:  07/15/22 (!) 106/52  06/30/22 134/70  06/29/22 (!) 154/84    Physical Exam Vitals reviewed.  Constitutional:      General: Nancy Schroeder is not in acute distress.    Appearance: Normal appearance. Nancy Schroeder is obese. Nancy Schroeder is not toxic-appearing.     Comments: Examined in wheelchair  HENT:     Head: Normocephalic and atraumatic.     Right Ear: External ear normal.     Left Ear: External ear normal.     Nose: Nose normal. No congestion or rhinorrhea.     Mouth/Throat:     Mouth: Mucous membranes are moist.     Pharynx: Oropharynx is clear. No oropharyngeal exudate or posterior oropharyngeal erythema.  Eyes:     General: No scleral icterus.    Extraocular Movements: Extraocular movements intact.     Conjunctiva/sclera: Conjunctivae normal.     Pupils: Pupils are equal, round, and reactive to light.  Cardiovascular:     Rate and Rhythm: Normal rate and regular rhythm.     Pulses: Normal pulses.     Heart sounds: Normal heart sounds. No murmur heard.    No friction rub. No gallop.  Pulmonary:     Effort: Pulmonary effort is normal.     Breath sounds: Normal breath sounds. No wheezing, rhonchi or rales.  Abdominal:  General: Abdomen is flat. Bowel sounds are normal. There is no distension.     Palpations: Abdomen is soft.     Tenderness: There is no abdominal tenderness.  Musculoskeletal:        General: No swelling. Normal range of motion.     Cervical back: Normal range of motion.     Right lower leg: No edema.     Left lower leg: No edema.  Lymphadenopathy:     Cervical: No cervical adenopathy.  Skin:    General: Skin is warm and dry.     Capillary Refill: Capillary refill takes less than 2 seconds.     Coloration: Skin is not jaundiced.  Neurological:     General: No focal deficit present.     Mental Status: Nancy Schroeder is alert and oriented to person, place, and time.     Sensory: Sensory deficit present.     Motor: Weakness present.     Gait: Gait abnormal.  Psychiatric:        Mood and Affect: Mood normal.        Behavior: Behavior normal.    Last CBC Lab Results  Component Value Date   WBC 10.5 06/14/2022   HGB 12.0 06/14/2022   HCT 37.0 06/14/2022   MCV 94.9 06/14/2022   MCH 30.8 06/14/2022   RDW 13.1 06/14/2022   PLT 301 06/14/2022   Last metabolic panel Lab Results  Component Value Date   GLUCOSE 111 (H) 06/14/2022   NA 137 06/14/2022   K 4.3 06/14/2022   CL 110 06/14/2022   CO2 23 06/14/2022   BUN 19 06/14/2022   CREATININE 0.66 06/14/2022   GFRNONAA >60 06/14/2022   CALCIUM 8.5 (L) 06/14/2022   PROT 6.5 10/22/2021   ALBUMIN 4.1 10/22/2021   LABGLOB 2.4 10/22/2021   AGRATIO 1.7 10/22/2021   BILITOT 0.3 10/22/2021   ALKPHOS 79 10/22/2021   AST 50 (H) 10/22/2021   ALT 43 (H) 10/22/2021   ANIONGAP 4 (L) 06/14/2022   Last lipids Lab Results  Component Value Date   CHOL 167 10/01/2020   HDL 45 10/01/2020   LDLCALC 102 (H) 10/01/2020   TRIG 108 10/01/2020   CHOLHDL 3.7 10/01/2020   Last hemoglobin A1c Lab Results  Component Value Date   HGBA1C 6.1 (H) 10/22/2021   Last thyroid functions Lab Results  Component Value Date   TSH 2.666 06/13/2022   T4TOTAL 8.5 12/29/2020   Last vitamin D Lab Results  Component Value Date   VD25OH 16.0 (L) 10/22/2021   Last vitamin B12 and Folate Lab Results  Component Value Date   VITAMINB12 349 10/22/2021      Assessment & Plan:   Problem List Items Addressed This Visit       Essential hypertension    Presenting today for HTN follow-up.  Her current antihypertensive regimen consists of amlodipine 10 mg daily, HCTZ 25 mg daily, lisinopril 40 mg daily.  Nancy Schroeder is mildly hypotensive today with BP readings of 91/45 and 106/52. -Decrease amlodipine to 5 mg daily -Continue HCTZ and lisinopril at current doses -Follow-up in 2-4 weeks for HTN check      Idiopathic peripheral neuropathy - Primary    Nancy Schroeder continues to endorse neuropathic pain in her lower extremities.  Symptoms have not improved despite starting gabapentin.  Cymbalta has recently been  prescribed but Nancy Schroeder has not filled this prescription. -I have recommended that Nancy Schroeder continue gabapentin and start Cymbalta as recently prescribed. -Follow-up in 2-4 weeks for reassessment -Lidocaine  5% cream prescribed today       Return in about 2 weeks (around 07/29/2022) for HTN.    Billie Lade, MD

## 2022-07-16 ENCOUNTER — Telehealth: Payer: Self-pay | Admitting: Family Medicine

## 2022-07-16 ENCOUNTER — Other Ambulatory Visit: Payer: Self-pay

## 2022-07-16 DIAGNOSIS — G8929 Other chronic pain: Secondary | ICD-10-CM

## 2022-07-16 MED ORDER — LIDOCAINE 5 % EX OINT
1.0000 | TOPICAL_OINTMENT | CUTANEOUS | 0 refills | Status: DC | PRN
Start: 2022-07-16 — End: 2022-08-09

## 2022-07-16 NOTE — Telephone Encounter (Signed)
Sent!

## 2022-07-16 NOTE — Telephone Encounter (Signed)
[facility] [phone] - [place], [place] - [address] AT [facility] OF [address], [place] [address] Phone: [phone]  Fax: [phone] DEA #: [id number]    Does not have Lidocaine 5 % CREA  In stock   Does have ointment in stock

## 2022-07-20 ENCOUNTER — Ambulatory Visit: Payer: Medicare HMO | Admitting: Podiatry

## 2022-07-22 ENCOUNTER — Encounter: Payer: Self-pay | Admitting: Internal Medicine

## 2022-07-22 DIAGNOSIS — G9389 Other specified disorders of brain: Secondary | ICD-10-CM | POA: Diagnosis not present

## 2022-07-22 DIAGNOSIS — G8929 Other chronic pain: Secondary | ICD-10-CM | POA: Diagnosis not present

## 2022-07-22 DIAGNOSIS — F419 Anxiety disorder, unspecified: Secondary | ICD-10-CM | POA: Diagnosis not present

## 2022-07-22 DIAGNOSIS — I10 Essential (primary) hypertension: Secondary | ICD-10-CM | POA: Diagnosis not present

## 2022-07-22 DIAGNOSIS — M9973 Connective tissue and disc stenosis of intervertebral foramina of lumbar region: Secondary | ICD-10-CM | POA: Diagnosis not present

## 2022-07-22 DIAGNOSIS — E039 Hypothyroidism, unspecified: Secondary | ICD-10-CM | POA: Diagnosis not present

## 2022-07-22 DIAGNOSIS — M5136 Other intervertebral disc degeneration, lumbar region: Secondary | ICD-10-CM | POA: Diagnosis not present

## 2022-07-22 DIAGNOSIS — N390 Urinary tract infection, site not specified: Secondary | ICD-10-CM | POA: Diagnosis not present

## 2022-07-22 DIAGNOSIS — S92511D Displaced fracture of proximal phalanx of right lesser toe(s), subsequent encounter for fracture with routine healing: Secondary | ICD-10-CM | POA: Diagnosis not present

## 2022-07-22 NOTE — Assessment & Plan Note (Signed)
She continues to endorse neuropathic pain in her lower extremities.  Symptoms have not improved despite starting gabapentin.  Cymbalta has recently been prescribed but she has not filled this prescription. -I have recommended that she continue gabapentin and start Cymbalta as recently prescribed. -Follow-up in 2-4 weeks for reassessment -Lidocaine 5% cream prescribed today

## 2022-07-22 NOTE — Assessment & Plan Note (Signed)
Presenting today for HTN follow-up.  Her current antihypertensive regimen consists of amlodipine 10 mg daily, HCTZ 25 mg daily, lisinopril 40 mg daily.  She is mildly hypotensive today with BP readings of 91/45 and 106/52. -Decrease amlodipine to 5 mg daily -Continue HCTZ and lisinopril at current doses -Follow-up in 2-4 weeks for HTN check

## 2022-07-29 ENCOUNTER — Ambulatory Visit: Payer: Medicare HMO | Admitting: Family Medicine

## 2022-08-09 ENCOUNTER — Other Ambulatory Visit: Payer: Self-pay | Admitting: Family Medicine

## 2022-08-09 ENCOUNTER — Other Ambulatory Visit: Payer: Self-pay | Admitting: Internal Medicine

## 2022-08-09 DIAGNOSIS — M545 Low back pain, unspecified: Secondary | ICD-10-CM

## 2022-08-10 ENCOUNTER — Encounter: Payer: Self-pay | Admitting: Family Medicine

## 2022-08-10 ENCOUNTER — Ambulatory Visit (INDEPENDENT_AMBULATORY_CARE_PROVIDER_SITE_OTHER): Payer: Medicare HMO | Admitting: Family Medicine

## 2022-08-10 VITALS — BP 142/64 | HR 76 | Ht 61.0 in | Wt 175.0 lb

## 2022-08-10 DIAGNOSIS — M79604 Pain in right leg: Secondary | ICD-10-CM | POA: Diagnosis not present

## 2022-08-10 DIAGNOSIS — R7303 Prediabetes: Secondary | ICD-10-CM

## 2022-08-10 DIAGNOSIS — M79605 Pain in left leg: Secondary | ICD-10-CM

## 2022-08-10 DIAGNOSIS — R399 Unspecified symptoms and signs involving the genitourinary system: Secondary | ICD-10-CM | POA: Diagnosis not present

## 2022-08-10 DIAGNOSIS — I10 Essential (primary) hypertension: Secondary | ICD-10-CM

## 2022-08-10 DIAGNOSIS — E785 Hyperlipidemia, unspecified: Secondary | ICD-10-CM

## 2022-08-10 DIAGNOSIS — N390 Urinary tract infection, site not specified: Secondary | ICD-10-CM | POA: Diagnosis not present

## 2022-08-10 MED ORDER — OXYCODONE HCL 5 MG PO CAPS: 5.0000 mg | ORAL_CAPSULE | Freq: Two times a day (BID) | ORAL | 0 refills | Status: DC | PRN

## 2022-08-10 NOTE — Assessment & Plan Note (Signed)
Urinalysis, and urine culture ordered- Awaiting results will follow up. May take OTC AZO for urinary pain relief. Explained to increase oral fluid intake. Drink 8 glasses of water daily.  Follow up for worsening or persistent symptoms. Patient verbalizes understanding regarding plan of care and all questions answered.

## 2022-08-10 NOTE — Assessment & Plan Note (Signed)
Vitals:   08/10/22 0942 08/10/22 0945  BP: (!) 147/63 (!) 142/64  Blood pressure not controlled today. Patient reported did not take her blood pressure medication today Advise patient the importance of medication compliance to prevent hypertension crisis Patient reported current regimen includes amlodipine 5 mg, hydrochlorothiazide 25 mg, and Lisinopril 40 mg Explained non pharmacological interventions such as low salt, DASH diet discussed. Educated on stress reduction and physical activity minimum 150 minutes per week. Discussed signs and symptoms of major cardiovascular event and need to present to the ED.  Patient verbalizes understanding regarding plan of care and all questions answered.

## 2022-08-10 NOTE — Assessment & Plan Note (Addendum)
Currently on Cymbalta 20 mg daily and Gabapentin 300 mg 3 times daily Patient reported pain comes and go and medication sometimes help Prescribed oxycodone 5 mg PRN for breakthrough pain only  Hemoglobin A1c result not updated ordered in today's visit

## 2022-08-10 NOTE — Progress Notes (Signed)
Patient Office Visit   Subjective   Patient ID: Nancy Schroeder, female    DOB: 1934/01/23  Age: 89 y.o. MRN: [id number]  CC:  Chief Complaint  Patient presents with   Hypertension    Patient is here for HTN f/u. No changes since last visit. Complains of sore legs.     HPI Nancy Schroeder 89 year old female, presents to the clinic for HTN follow up. She  has a past medical history of Anxiety, Depression, Essential hypertension, and H/O back injury (2016).For the details of today's visit, please refer to assessment and plan.   HPI    Outpatient Encounter Medications as of 08/10/2022  Medication Sig   amLODipine (NORVASC) 5 MG tablet Take 1 tablet (5 mg total) by mouth daily. Hypertension   aspirin EC 81 MG tablet Take 81 mg by mouth daily. Swallow whole.   busPIRone (BUSPAR) 10 MG tablet Take 1 tablet (10 mg total) by mouth daily.   carbamazepine (TEGRETOL) 200 MG tablet Take 1 tablet (200 mg total) by mouth in the morning and at bedtime.   cyanocobalamin (VITAMIN B12) 1000 MCG tablet Take 1 tablet (1,000 mcg total) by mouth daily.   DULoxetine (CYMBALTA) 20 MG capsule Take 1 capsule (20 mg total) by mouth daily.   gabapentin (NEURONTIN) 300 MG capsule Take 1 capsule (300 mg total) by mouth 3 (three) times daily.   hydrochlorothiazide (HYDRODIURIL) 25 MG tablet Take 1 tablet (25 mg total) by mouth daily. Hypertension   levothyroxine (SYNTHROID) 25 MCG tablet TAKE 1 TABLET(25 MCG) BY MOUTH DAILY BEFORE BREAKFAST   lidocaine (XYLOCAINE) 5 % ointment APPLY 1 APPLICATION TOPICALLY AS NEEDED.   Lidocaine 5 % CREA Apply 1 Application topically at bedtime as needed.   lisinopril (ZESTRIL) 40 MG tablet Take 1 tablet (40 mg total) by mouth daily. Hypertension   oxycodone (OXY-IR) 5 MG capsule Take 1 capsule (5 mg total) by mouth every 12 (twelve) hours as needed for pain.   No facility-administered encounter medications on file as of 08/10/2022.    Past Surgical History:  Procedure  Laterality Date   ABDOMINAL HYSTERECTOMY     total   CATARACT EXTRACTION, BILATERAL  01/2020   CHOLECYSTECTOMY     NECK SURGERY      Review of Systems  Constitutional:  Negative for chills and fever.  Eyes:  Negative for blurred vision.  Respiratory:  Negative for shortness of breath.   Cardiovascular:  Negative for chest pain.  Gastrointestinal:  Negative for abdominal pain.  Genitourinary:  Negative for dysuria.  Musculoskeletal:  Positive for myalgias.  Neurological:  Negative for dizziness and headaches.      Objective    BP (!) 142/64   Pulse 76   Ht 5\' 1"  (1.549 m)   Wt 175 lb (79.4 kg)   SpO2 95%   BMI 33.07 kg/m   Physical Exam Vitals reviewed.  Constitutional:      General: She is not in acute distress.    Appearance: Normal appearance. She is not ill-appearing, toxic-appearing or diaphoretic.  HENT:     Head: Normocephalic.  Eyes:     General:        Right eye: No discharge.        Left eye: No discharge.     Conjunctiva/sclera: Conjunctivae normal.  Cardiovascular:     Rate and Rhythm: Normal rate.     Pulses: Normal pulses.     Heart sounds: Normal heart sounds.  Pulmonary:  Effort: Pulmonary effort is normal. No respiratory distress.     Breath sounds: Normal breath sounds.  Abdominal:     General: Bowel sounds are normal.     Palpations: Abdomen is soft.     Tenderness: There is no abdominal tenderness. There is no guarding.  Musculoskeletal:        General: Normal range of motion.     Cervical back: Normal range of motion.  Skin:    General: Skin is warm and dry.     Capillary Refill: Capillary refill takes less than 2 seconds.  Neurological:     General: No focal deficit present.     Mental Status: She is alert and oriented to person, place, and time.     Coordination: Coordination abnormal.     Gait: Gait abnormal.  Psychiatric:        Mood and Affect: Mood normal.        Behavior: Behavior normal.       Assessment & Plan:   Prediabetes -     Hemoglobin A1c  Hyperlipidemia, unspecified hyperlipidemia type -     Lipid panel -     Basic metabolic panel  Urinary symptom or sign -     Urinalysis -     Urine Culture  Essential hypertension Assessment & Plan: Vitals:   08/10/22 0942 08/10/22 0945  BP: (!) 147/63 (!) 142/64  Blood pressure not controlled today. Patient reported did not take her blood pressure medication today Advise patient the importance of medication compliance to prevent hypertension crisis Patient reported current regimen includes amlodipine 5 mg, hydrochlorothiazide 25 mg, and Lisinopril 40 mg Explained non pharmacological interventions such as low salt, DASH diet discussed. Educated on stress reduction and physical activity minimum 150 minutes per week. Discussed signs and symptoms of major cardiovascular event and need to present to the ED.  Patient verbalizes understanding regarding plan of care and all questions answered.     Bilateral lower extremity pain Assessment & Plan: Currently on Cymbalta 20 mg daily and Gabapentin 300 mg 3 times daily Patient reported pain comes and go and medication sometimes help Prescribed oxycodone 5 mg PRN for breakthrough pain only  Hemoglobin A1c result not updated ordered in today's visit   Acute lower UTI Assessment & Plan: Urinalysis, and urine culture ordered- Awaiting results will follow up. May take OTC AZO for urinary pain relief. Explained to increase oral fluid intake. Drink 8 glasses of water daily.  Follow up for worsening or persistent symptoms. Patient verbalizes understanding regarding plan of care and all questions answered.    Other orders -     oxyCODONE HCl; Take 1 capsule (5 mg total) by mouth every 12 (twelve) hours as needed for pain.  Dispense: 10 capsule; Refill: 0    Return in about 3 months (around 11/10/2022), or if symptoms worsen or fail to improve, for routine labs, chronic follow-up.   Cruzita Lederer Newman Nip,  FNP

## 2022-08-10 NOTE — Patient Instructions (Signed)

## 2022-08-11 ENCOUNTER — Ambulatory Visit: Payer: Medicare HMO | Admitting: Podiatry

## 2022-08-11 ENCOUNTER — Other Ambulatory Visit: Payer: Self-pay

## 2022-08-11 VITALS — BP 160/57

## 2022-08-11 DIAGNOSIS — M79674 Pain in right toe(s): Secondary | ICD-10-CM

## 2022-08-11 DIAGNOSIS — R399 Unspecified symptoms and signs involving the genitourinary system: Secondary | ICD-10-CM

## 2022-08-11 DIAGNOSIS — I739 Peripheral vascular disease, unspecified: Secondary | ICD-10-CM | POA: Diagnosis not present

## 2022-08-11 DIAGNOSIS — M79675 Pain in left toe(s): Secondary | ICD-10-CM

## 2022-08-11 DIAGNOSIS — B351 Tinea unguium: Secondary | ICD-10-CM

## 2022-08-11 NOTE — Progress Notes (Unsigned)
  Subjective:  Patient ID: Nancy Schroeder, female    DOB: 01-Mar-1933,  MRN: [id number]  Nancy Schroeder presents to clinic today for at risk foot care. Patient has h/o PAD and painful elongated mycotic toenails 1-5 bilaterally which are tender when wearing enclosed shoe gear. Pain is relieved with periodic professional debridement. Patient in wheelchair repeating "Help me, help me." Son relates she saw PCP on yesterday and he is to take a urine sample to the lab. Chief Complaint  Patient presents with   Nail Problem    Routine foot care   New problem(s): None.   PCP is Del Newman Nip, Tenna Child, FNP.  Allergies  Allergen Reactions   Amoxicillin Hives   Augmentin [Amoxicillin-Pot Clavulanate] Hives    Review of Systems: Negative except as noted in the HPI.  Objective: No changes noted in today's physical examination. Vitals:   08/11/22 0849  BP: (!) 160/57   Nancy Schroeder is a pleasant 89 y.o. female WD, WN in NAD. Patient alert, awake and anxious on today's visit.  Vascular Examination: CFT <3 seconds b/l. DP/PT pulses faintly palpable b/l. Skin temperature gradient warm to warm b/l. No pain with calf compression. No ischemia or gangrene. No cyanosis or clubbing noted b/l. No edema noted b/l LE.   Neurological Examination: Sensation grossly intact b/l with 10 gram monofilament. Vibratory sensation intact b/l.   Dermatological Examination: Pedal skin warm and supple b/l.   No open wounds. No interdigital macerations.  Toenails 1-5 b/l thick, discolored, elongated with subungual debris and pain on dorsal palpation.    No hyperkeratotic nor porokeratotic lesions present on today's visit.  Musculoskeletal Examination: Muscle strength 5/5 to b/l LE. HAV with bunion bilaterally and hammertoes 2-5 b/l.  Radiographs: None  Last A1c:      Latest Ref Rng & Units 10/22/2021    1:49 PM  Hemoglobin A1C  Hemoglobin-A1c 4.8 - 5.6 % 6.1    Assessment/Plan: 1. Pain due  to onychomycosis of toenails of both feet   2. Peripheral vascular disease (HCC)    -Consent given for treatment as described below: -Examined patient. -Continue supportive shoe gear daily. -Mycotic toenails 1-5 bilaterally were debrided in length and girth with sterile nail nippers and dremel without incident. -Patient/POA to call should there be question/concern in the interim.   Return in about 3 months (around 11/11/2022).  [name], DPM

## 2022-08-12 ENCOUNTER — Encounter: Payer: Self-pay | Admitting: Podiatry

## 2022-08-12 LAB — URINALYSIS
Bilirubin, UA: NEGATIVE
Glucose, UA: NEGATIVE
Ketones, UA: NEGATIVE
Nitrite, UA: NEGATIVE
Protein,UA: NEGATIVE
RBC, UA: NEGATIVE
Specific Gravity, UA: 1.01 (ref 1.005–1.030)
Urobilinogen, Ur: 0.2 mg/dL (ref 0.2–1.0)
pH, UA: 6.5 (ref 5.0–7.5)

## 2022-08-15 LAB — URINE CULTURE

## 2022-08-27 DIAGNOSIS — M9902 Segmental and somatic dysfunction of thoracic region: Secondary | ICD-10-CM | POA: Diagnosis not present

## 2022-08-27 DIAGNOSIS — M9905 Segmental and somatic dysfunction of pelvic region: Secondary | ICD-10-CM | POA: Diagnosis not present

## 2022-08-27 DIAGNOSIS — M5416 Radiculopathy, lumbar region: Secondary | ICD-10-CM | POA: Diagnosis not present

## 2022-08-27 DIAGNOSIS — M9903 Segmental and somatic dysfunction of lumbar region: Secondary | ICD-10-CM | POA: Diagnosis not present

## 2022-09-01 DIAGNOSIS — M5416 Radiculopathy, lumbar region: Secondary | ICD-10-CM | POA: Diagnosis not present

## 2022-09-01 DIAGNOSIS — M9905 Segmental and somatic dysfunction of pelvic region: Secondary | ICD-10-CM | POA: Diagnosis not present

## 2022-09-01 DIAGNOSIS — M9902 Segmental and somatic dysfunction of thoracic region: Secondary | ICD-10-CM | POA: Diagnosis not present

## 2022-09-01 DIAGNOSIS — M9903 Segmental and somatic dysfunction of lumbar region: Secondary | ICD-10-CM | POA: Diagnosis not present

## 2022-09-03 DIAGNOSIS — M9905 Segmental and somatic dysfunction of pelvic region: Secondary | ICD-10-CM | POA: Diagnosis not present

## 2022-09-03 DIAGNOSIS — M9902 Segmental and somatic dysfunction of thoracic region: Secondary | ICD-10-CM | POA: Diagnosis not present

## 2022-09-03 DIAGNOSIS — M5416 Radiculopathy, lumbar region: Secondary | ICD-10-CM | POA: Diagnosis not present

## 2022-09-03 DIAGNOSIS — M9903 Segmental and somatic dysfunction of lumbar region: Secondary | ICD-10-CM | POA: Diagnosis not present

## 2022-09-06 DIAGNOSIS — M9905 Segmental and somatic dysfunction of pelvic region: Secondary | ICD-10-CM | POA: Diagnosis not present

## 2022-09-06 DIAGNOSIS — M9902 Segmental and somatic dysfunction of thoracic region: Secondary | ICD-10-CM | POA: Diagnosis not present

## 2022-09-06 DIAGNOSIS — M5416 Radiculopathy, lumbar region: Secondary | ICD-10-CM | POA: Diagnosis not present

## 2022-09-06 DIAGNOSIS — M9903 Segmental and somatic dysfunction of lumbar region: Secondary | ICD-10-CM | POA: Diagnosis not present

## 2022-09-20 ENCOUNTER — Encounter: Payer: Self-pay | Admitting: Neurology

## 2022-09-20 ENCOUNTER — Telehealth: Payer: Self-pay | Admitting: Neurology

## 2022-09-20 NOTE — Telephone Encounter (Signed)
LVM and sent letter in mail informing pt of need to reschedule 01/11/23 appt - MD out

## 2022-10-18 ENCOUNTER — Ambulatory Visit (INDEPENDENT_AMBULATORY_CARE_PROVIDER_SITE_OTHER): Payer: Medicare HMO

## 2022-10-18 DIAGNOSIS — Z0000 Encounter for general adult medical examination without abnormal findings: Secondary | ICD-10-CM

## 2022-10-18 NOTE — Patient Instructions (Signed)
  Nancy Schroeder , Thank you for taking time to come for your Medicare Wellness Visit. I appreciate your ongoing commitment to your health goals. Please review the following plan we discussed and let me know if I can assist you in the future.   These are the goals we discussed:  Goals      Improve mobility     -Patients son wants her to walk better -Patients son agrees to continue to work with [facility] for physical therapy     Patient Stated     Get shingrix vaccine at [facility]     Patient Stated     Get pneumonia vaccine at your next appt with Dr Allena Katz     Patient Stated     Maintain health        This is a list of the screening recommended for you and due dates:  Health Maintenance  Topic Date Due   DTaP/Tdap/Td vaccine (1 - Tdap) Never done   DEXA scan (bone density measurement)  Never done   Zoster (Shingles) Vaccine (2 of 2) 12/21/2021   Flu Shot  09/02/2022   COVID-19 Vaccine (4 - 2023-24 season) 10/03/2022   Medicare Annual Wellness Visit  10/18/2023   Pneumonia Vaccine  Completed   HPV Vaccine  Aged Out

## 2022-10-18 NOTE — Progress Notes (Signed)
Subjective:   Nancy Schroeder is a 89 y.o. female who presents for Medicare Annual (Subsequent) preventive examination.  Visit Complete: Virtual  I connected with  Nancy Schroeder on 10/18/22 by a audio enabled telemedicine application and verified that I am speaking with the correct person using two identifiers.  Patient Location: Home  Provider Location: Office/Clinic  I discussed the limitations of evaluation and management by telemedicine. The patient expressed understanding and agreed to proceed.  Patient Medicare AWV questionnaire was completed by the patient on ; I have confirmed that all information answered by patient is correct and no changes since this date.  Cardiac Risk Factors include: advanced age (>29men, >28 women)     Objective:    There were no vitals filed for this visit. There is no height or weight on file to calculate BMI.     10/18/2022    8:52 AM 06/12/2022   11:25 AM 09/14/2021    8:46 AM 08/29/2020    9:37 AM 04/16/2020   12:34 PM 08/14/2019    4:04 PM  Advanced Directives  Does Patient Have a Medical Advance Directive? No No No No No No  Would patient like information on creating a medical advance directive? Yes (ED - Information included in AVS) No - Patient declined Yes (ED - Information included in AVS) No - Patient declined      Current Medications (verified) Outpatient Encounter Medications as of 10/18/2022  Medication Sig   amLODipine (NORVASC) 5 MG tablet Take 1 tablet (5 mg total) by mouth daily. Hypertension   aspirin EC 81 MG tablet Take 81 mg by mouth daily. Swallow whole.   busPIRone (BUSPAR) 10 MG tablet Take 1 tablet (10 mg total) by mouth daily.   cyanocobalamin (VITAMIN B12) 1000 MCG tablet Take 1 tablet (1,000 mcg total) by mouth daily.   DULoxetine (CYMBALTA) 20 MG capsule Take 1 capsule (20 mg total) by mouth daily.   hydrochlorothiazide (HYDRODIURIL) 25 MG tablet Take 1 tablet (25 mg total) by mouth daily. Hypertension    levothyroxine (SYNTHROID) 25 MCG tablet TAKE 1 TABLET(25 MCG) BY MOUTH DAILY BEFORE BREAKFAST   lidocaine (XYLOCAINE) 5 % ointment APPLY 1 APPLICATION TOPICALLY AS NEEDED.   Lidocaine 5 % CREA Apply 1 Application topically at bedtime as needed.   lisinopril (ZESTRIL) 40 MG tablet Take 1 tablet (40 mg total) by mouth daily. Hypertension   oxycodone (OXY-IR) 5 MG capsule Take 1 capsule (5 mg total) by mouth every 12 (twelve) hours as needed for pain.   carbamazepine (TEGRETOL) 200 MG tablet Take 1 tablet (200 mg total) by mouth in the morning and at bedtime.   gabapentin (NEURONTIN) 300 MG capsule Take 1 capsule (300 mg total) by mouth 3 (three) times daily.   No facility-administered encounter medications on file as of 10/18/2022.    Allergies (verified) Amoxicillin and Augmentin [amoxicillin-pot clavulanate]   History: Past Medical History:  Diagnosis Date   Anxiety    Depression    Essential hypertension    H/O back injury 2016   Past Surgical History:  Procedure Laterality Date   ABDOMINAL HYSTERECTOMY     total   CATARACT EXTRACTION, BILATERAL  01/2020   CHOLECYSTECTOMY     NECK SURGERY     Family History  Problem Relation Age of Onset   Diabetes Mother    Diabetes Father    Diabetes Sister    Social History   Socioeconomic History   Marital status: Widowed    Spouse name: Not  on file   Number of children: 3   Years of education: Not on file   Highest education level: 8th grade  Occupational History   Occupation: retired  Tobacco Use   Smoking status: Never   Smokeless tobacco: Never  Vaping Use   Vaping status: Never Used  Substance and Sexual Activity   Alcohol use: Yes    Comment: 1-2 drinks per 6 months   Drug use: Never   Sexual activity: Not Currently    Birth control/protection: Surgical, Post-menopausal  Other Topics Concern   Not on file  Social History Narrative   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 Read, tv, putt around, likes astrology      Diet: Eats what she wants. Enjoys veggies and fruits   Caffeine: 1-2 cups of coffee, iced tea   Water: 6-8 cups daily       Wear seatbelt   Does not wear sunscreen   Smoke and carbon monoxide detectors         Social Determinants of Health   Financial Resource Strain: Low Risk  (10/18/2022)   Overall Financial Resource Strain (CARDIA)    Difficulty of Paying Living Expenses: Not hard at all  Food Insecurity: No Food Insecurity (10/18/2022)   Hunger Vital Sign    Worried About Running Out of Food in the Last Year: Never true    Ran Out of Food in the Last Year: Never true  Transportation Needs: No Transportation Needs (10/18/2022)   PRAPARE - Administrator, Civil Service (Medical): No    Lack of Transportation (Non-Medical): No  Physical Activity: Inactive (10/18/2022)   Exercise Vital Sign    Days of Exercise per Week: 0 days    Minutes of Exercise per Session: 0 min  Stress: No Stress Concern Present (10/18/2022)   [employer] of Occupational Health - Occupational Stress Questionnaire    Feeling of Stress : Not at all  Social Connections: Socially Isolated (10/18/2022)   Social Connection and Isolation Panel [NHANES]    Frequency of Communication with Friends and Family: Once a week    Frequency of Social Gatherings with Friends and Family: Once a week    Attends Religious Services: Never    Database administrator or Organizations: No    Attends Banker Meetings: Never    Marital Status: Widowed    Tobacco Counseling Counseling given: Not Answered   Clinical Intake:  Pre-visit preparation completed: Yes  Pain : No/denies pain     BMI - recorded: 33.08 Nutritional Status: BMI > 30  Obese Nutritional Risks: None Diabetes:  No  How often do you need to have someone help you when you read instructions, pamphlets, or other written materials from your doctor or pharmacy?: 1 - Never What is the last grade level you completed in school?: 8  Interpreter Needed?: No  Information entered by :: Nancy Schroeder   Activities of Daily Living    10/18/2022    8:48 AM 06/12/2022    6:00 PM  In your present state of health, do you have any difficulty performing the following activities:  Hearing? 0 0  Vision? 0 0  Difficulty concentrating or making decisions? 0 1  Walking or climbing  stairs? 0 1  Dressing or bathing? 0 1  Doing errands, shopping? 0 1  Preparing Food and eating ? N   Using the Toilet? N   In the past six months, have you accidently leaked urine? N   Do you have problems with loss of bowel control? N   Managing your Medications? N   Managing your Finances? N   Housekeeping or managing your Housekeeping? N     Patient Care Team: Del Newman Nip, Tenna Child, FNP as PCP - General (Family Medicine) Jonelle Sidle, MD as PCP - Cardiology (Cardiology)  Indicate any recent Medical Services you may have received from other than [facility] providers in the past year (date may be approximate).     Assessment:   This is a routine wellness examination for [place].  Hearing/Vision screen No results found.   Goals Addressed             This Visit's Progress    Patient Stated       Maintain health      Depression Screen    10/18/2022    8:52 AM 10/18/2022    8:51 AM 08/10/2022    9:44 AM 07/15/2022   10:50 AM 06/29/2022    8:51 AM 06/09/2022    9:25 AM 04/07/2022   10:44 AM  PHQ 2/9 Scores  PHQ - 2 Score 0 0 1 0 1 1 0  PHQ- 9 Score   7 4 10 3      Fall Risk    10/18/2022    8:52 AM 08/10/2022    9:44 AM 07/15/2022   10:50 AM 06/29/2022    8:51 AM 06/09/2022    9:25 AM  Fall Risk   Falls in the past year? 0 0 1 1 0  Number falls in past yr: 0 0 0 1 0  Injury with Fall? 0 0 0 0 0  Risk for fall due  to : No Fall Risks  No Fall Risks Impaired balance/gait No Fall Risks  Follow up Falls evaluation completed  Falls evaluation completed Falls evaluation completed Falls evaluation completed    MEDICARE RISK AT HOME: Medicare Risk at Home Any stairs in or around the home?: Yes If so, are there any without handrails?: No Home free of loose throw rugs in walkways, pet beds, electrical cords, etc?: Yes Adequate lighting in your home to reduce risk of falls?: Yes Life alert?: No Use of a cane, walker or w/c?: Yes Grab bars in the bathroom?: No Shower chair or bench in shower?: Yes Elevated toilet seat or a handicapped toilet?: No  TIMED UP AND GO:  Was the test performed?  No    Cognitive Function:    10/18/2022    8:52 AM 02/06/2019    1:11 PM  MMSE - Mini Mental State Exam  Not completed: Unable to complete   Orientation to time  5  Orientation to Place  5  Registration  3  Attention/ Calculation  5  Recall  3  Language- name 2 objects  2  Language- repeat  1  Language- follow 3 step command  3  Language- read & follow direction  1  Write a sentence  1  Copy design  1  Total score  30        10/18/2022    8:52 AM 08/29/2020    9:42 AM  6CIT Screen  What Year? 0 points 0 points  What month? 0 points 0 points  What time? 0  points 0 points  Count back from 20 0 points 0 points  Months in reverse 0 points 0 points  Repeat phrase 0 points 2 points  Total Score 0 points 2 points    Immunizations Immunization History  Administered Date(s) Administered   Fluad Quad(high Dose 65+) 11/14/2019, 10/01/2020, 10/22/2021   Moderna Sars-Covid-2 Vaccination 03/18/2019, 04/15/2019, 12/05/2019   PNEUMOCOCCAL CONJUGATE-20 10/22/2021   Zoster Recombinant(Shingrix) 10/26/2021    TDAP status: Due, Education has been provided regarding the importance of this vaccine. Advised may receive this vaccine at local pharmacy or Health Dept. Aware to provide a copy of the vaccination record if  obtained from local pharmacy or Health Dept. Verbalized acceptance and understanding.  Flu Vaccine status: Due, Education has been provided regarding the importance of this vaccine. Advised may receive this vaccine at local pharmacy or Health Dept. Aware to provide a copy of the vaccination record if obtained from local pharmacy or Health Dept. Verbalized acceptance and understanding.  Pneumococcal vaccine status: Up to date  Covid-19 vaccine status: Completed vaccines  Qualifies for Shingles Vaccine? Yes   Zostavax completed Yes   Shingrix Completed?: No.    Education has been provided regarding the importance of this vaccine. Patient has been advised to call insurance company to determine out of pocket expense if they have not yet received this vaccine. Advised may also receive vaccine at local pharmacy or Health Dept. Verbalized acceptance and understanding.  Screening Tests Health Maintenance  Topic Date Due   DTaP/Tdap/Td (1 - Tdap) Never done   DEXA SCAN  Never done   Zoster Vaccines- Shingrix (2 of 2) 12/21/2021   INFLUENZA VACCINE  09/02/2022   COVID-19 Vaccine (4 - 2023-24 season) 10/03/2022   Medicare Annual Wellness (AWV)  10/18/2023   Pneumonia Vaccine 67+ Years old  Completed   HPV VACCINES  Aged Out    Health Maintenance  Health Maintenance Due  Topic Date Due   DTaP/Tdap/Td (1 - Tdap) Never done   DEXA SCAN  Never done   Zoster Vaccines- Shingrix (2 of 2) 12/21/2021   INFLUENZA VACCINE  09/02/2022   COVID-19 Vaccine (4 - 2023-24 season) 10/03/2022    Colorectal cancer screening: No longer required.   Mammogram status: No longer required due to age.  Bone Density status: Ordered  . Pt provided with contact info and advised to call to schedule appt.  Lung Cancer Screening: (Low Dose CT Chest recommended if Age 84-80 years, 20 pack-year currently smoking OR have quit w/in 15years.) does not qualify.   Lung Cancer Screening Referral: no  Additional  Screening:  Hepatitis C Screening: does qualify; Completed  Vision Screening: Recommended annual ophthalmology exams for early detection of glaucoma and other disorders of the eye. Is the patient up to date with their annual eye exam?  Yes  Who is the provider or what is the name of the office in which the patient attends annual eye exams? N/a If pt is not established with a provider, would they like to be referred to a provider to establish care? No .   Dental Screening: Recommended annual dental exams for proper oral hygiene    Community Resource Referral / Chronic Care Management: CRR required this visit?  No   CCM required this visit?  No     Plan:     I have personally reviewed and noted the following in the patient's chart:   Medical and social history Use of alcohol, tobacco or illicit drugs  Current medications and  supplements including opioid prescriptions. Patient is not currently taking opioid prescriptions. Functional ability and status Nutritional status Physical activity Advanced directives List of other physicians Hospitalizations, surgeries, and ER visits in previous 12 months Vitals Screenings to include cognitive, depression, and falls Referrals and appointments  In addition, I have reviewed and discussed with patient certain preventive protocols, quality metrics, and best practice recommendations. A written personalized care plan for preventive services as well as general preventive health recommendations were provided to patient.     Jasper Riling, CMA   10/18/2022   After Visit Summary: (Mail) Due to this being a telephonic visit, the after visit summary with patients personalized plan was offered to patient via mail   Nurse Notes:

## 2022-10-29 ENCOUNTER — Ambulatory Visit (INDEPENDENT_AMBULATORY_CARE_PROVIDER_SITE_OTHER): Payer: Medicare HMO | Admitting: Family Medicine

## 2022-10-29 VITALS — BP 130/60 | HR 80 | Ht 61.0 in | Wt 172.0 lb

## 2022-10-29 DIAGNOSIS — Z131 Encounter for screening for diabetes mellitus: Secondary | ICD-10-CM

## 2022-10-29 DIAGNOSIS — E782 Mixed hyperlipidemia: Secondary | ICD-10-CM | POA: Diagnosis not present

## 2022-10-29 DIAGNOSIS — I10 Essential (primary) hypertension: Secondary | ICD-10-CM

## 2022-10-29 DIAGNOSIS — E538 Deficiency of other specified B group vitamins: Secondary | ICD-10-CM | POA: Diagnosis not present

## 2022-10-29 DIAGNOSIS — H6123 Impacted cerumen, bilateral: Secondary | ICD-10-CM | POA: Diagnosis not present

## 2022-10-29 DIAGNOSIS — R32 Unspecified urinary incontinence: Secondary | ICD-10-CM | POA: Insufficient documentation

## 2022-10-29 DIAGNOSIS — N3942 Incontinence without sensory awareness: Secondary | ICD-10-CM | POA: Diagnosis not present

## 2022-10-29 DIAGNOSIS — E559 Vitamin D deficiency, unspecified: Secondary | ICD-10-CM

## 2022-10-29 DIAGNOSIS — E039 Hypothyroidism, unspecified: Secondary | ICD-10-CM | POA: Diagnosis not present

## 2022-10-29 MED ORDER — OXYBUTYNIN CHLORIDE ER 10 MG PO TB24: 10.0000 mg | ORAL_TABLET | Freq: Every day | ORAL | 2 refills | Status: DC

## 2022-10-29 MED ORDER — DEBROX 6.5 % OT SOLN
2.0000 [drp] | Freq: Two times a day (BID) | OTIC | 1 refills | Status: DC | PRN
Start: 2022-10-29 — End: 2023-03-02

## 2022-10-29 MED ORDER — KETOTIFEN FUMARATE 0.035 % OP SOLN: 1.0000 [drp] | Freq: Two times a day (BID) | OPHTHALMIC | 1 refills | Status: DC | PRN

## 2022-10-29 NOTE — Progress Notes (Signed)
New Patient Office Visit   Subjective   Patient ID: Nancy Schroeder, female    DOB: October 16, 1933  Age: 89 y.o. MRN: [id number]  CC:  Chief Complaint  Patient presents with   Hypertension        Urinary Incontinence    X 1 month   Alopecia   Hearing Problem         HPI Nancy Schroeder 89 year old female, presents to the clinic for HTN follow up and  urine incontinence starting a few weeks ago . She  has a past medical history of Anxiety, Depression, Essential hypertension, and H/O back injury (2016).For the details of today's visit, please refer to assessment and plan.   HPI    Outpatient Encounter Medications as of 10/29/2022  Medication Sig   amLODipine (NORVASC) 5 MG tablet Take 1 tablet (5 mg total) by mouth daily. Hypertension   aspirin EC 81 MG tablet Take 81 mg by mouth daily. Swallow whole.   busPIRone (BUSPAR) 10 MG tablet Take 1 tablet (10 mg total) by mouth daily.   carbamazepine (TEGRETOL) 200 MG tablet Take 1 tablet (200 mg total) by mouth in the morning and at bedtime.   carbamide peroxide (DEBROX) 6.5 % OTIC solution Place 2 drops into both ears 2 (two) times daily as needed.   cyanocobalamin (VITAMIN B12) 1000 MCG tablet Take 1 tablet (1,000 mcg total) by mouth daily.   hydrochlorothiazide (HYDRODIURIL) 25 MG tablet Take 1 tablet (25 mg total) by mouth daily. Hypertension   ketotifen (EYE ITCH RELIEF) 0.035 % ophthalmic solution Place 1 drop into both eyes 2 (two) times daily as needed.   levothyroxine (SYNTHROID) 25 MCG tablet TAKE 1 TABLET(25 MCG) BY MOUTH DAILY BEFORE BREAKFAST   lidocaine (XYLOCAINE) 5 % ointment APPLY 1 APPLICATION TOPICALLY AS NEEDED.   Lidocaine 5 % CREA Apply 1 Application topically at bedtime as needed.   lisinopril (ZESTRIL) 40 MG tablet Take 1 tablet (40 mg total) by mouth daily. Hypertension   [DISCONTINUED] oxybutynin (DITROPAN-XL) 10 MG 24 hr tablet Take 1 tablet (10 mg total) by mouth at bedtime.   DULoxetine (CYMBALTA) 20  MG capsule Take 1 capsule (20 mg total) by mouth daily.   gabapentin (NEURONTIN) 300 MG capsule Take 1 capsule (300 mg total) by mouth 3 (three) times daily. (Patient not taking: Reported on 10/29/2022)   oxybutynin (DITROPAN-XL) 10 MG 24 hr tablet Take 1 tablet (10 mg total) by mouth at bedtime. For urinary incontinence   [DISCONTINUED] oxycodone (OXY-IR) 5 MG capsule Take 1 capsule (5 mg total) by mouth every 12 (twelve) hours as needed for pain.   No facility-administered encounter medications on file as of 10/29/2022.    Past Surgical History:  Procedure Laterality Date   ABDOMINAL HYSTERECTOMY     total   CATARACT EXTRACTION, BILATERAL  01/2020   CHOLECYSTECTOMY     NECK SURGERY      Review of Systems  Constitutional:  Negative for chills and fever.  Eyes:  Negative for blurred vision.  Respiratory:  Negative for shortness of breath.   Cardiovascular:  Negative for chest pain.  Gastrointestinal:  Negative for abdominal pain, constipation and diarrhea.  Genitourinary:  Positive for frequency and urgency. Negative for dysuria, flank pain and hematuria.  Neurological:  Negative for dizziness and headaches.      Objective    BP 130/60   Pulse 80   Ht 5\' 1"  (1.549 m)   Wt 172 lb (78 kg)   SpO2  96%   BMI 32.50 kg/m   Physical Exam Vitals reviewed.  Constitutional:      General: She is not in acute distress.    Appearance: Normal appearance. She is not ill-appearing, toxic-appearing or diaphoretic.  HENT:     Head: Normocephalic.     Right Ear: There is impacted cerumen.     Left Ear: There is impacted cerumen.  Eyes:     General:        Right eye: No discharge.        Left eye: No discharge.     Conjunctiva/sclera: Conjunctivae normal.  Cardiovascular:     Rate and Rhythm: Normal rate.     Pulses: Normal pulses.     Heart sounds: Normal heart sounds.  Pulmonary:     Effort: Pulmonary effort is normal. No respiratory distress.     Breath sounds: Normal breath  sounds.  Abdominal:     General: Bowel sounds are normal.     Palpations: Abdomen is soft.     Tenderness: There is no abdominal tenderness. There is no right CVA tenderness, left CVA tenderness or guarding.  Musculoskeletal:        General: Normal range of motion.     Cervical back: Normal range of motion.  Skin:    General: Skin is warm and dry.     Capillary Refill: Capillary refill takes less than 2 seconds.  Neurological:     Mental Status: She is alert.  Psychiatric:        Mood and Affect: Mood normal.       Assessment & Plan:  Essential hypertension Assessment & Plan: Labs ordered in today's visit Continue Amlodipine 5 mg and Lisinopril 40 mg daily Continued discussion on DASH diet, low sodium diet and maintain a exercise routine for 150 minutes per week.    Urinary incontinence without sensory awareness Assessment & Plan: Trial on oxybutynin 10 mg at bedtime Discussed non-pharmacological interventions such as pelvic floor muscle training with Kegel exercises, behavioral training -gradually hold urine for longer periods, schedule toilet trip, to urinate every two to four hours rather than waiting for the need to go. Avoid or cut back on alcohol, caffeine or acidic foods.   Orders: -     Urinalysis -     Urine Culture  Vitamin B12 deficiency -     Vitamin B12  Vitamin D deficiency -     VITAMIN D 25 Hydroxy (Vit-D Deficiency, Fractures)  Acquired hypothyroidism -     TSH + free T4  Primary hypertension -     CMP14+EGFR  Mixed hyperlipidemia -     Lipid panel  Screening for diabetes mellitus -     Hemoglobin A1c  Impacted cerumen of both ears Assessment & Plan: Ear wax removal done today Debrox 6.5 otic solution PRN  Orders: -     Debrox; Place 2 drops into both ears 2 (two) times daily as needed.  Dispense: 15 mL; Refill: 1  Bilateral impacted cerumen  Other orders -     oxyBUTYnin Chloride ER; Take 1 tablet (10 mg total) by mouth at bedtime. For  urinary incontinence  Dispense: 30 tablet; Refill: 2 -     Ketotifen Fumarate; Place 1 drop into both eyes 2 (two) times daily as needed.  Dispense: 10 mL; Refill: 1    Return in about 4 months (around 02/28/2023), or if symptoms worsen or fail to improve, for chronic follow-up.   Cruzita Lederer Newman Nip, FNP

## 2022-10-29 NOTE — Patient Instructions (Signed)

## 2022-10-29 NOTE — Assessment & Plan Note (Signed)
Labs ordered in today's visit Continue Amlodipine 5 mg and Lisinopril 40 mg daily Continued discussion on DASH diet, low sodium diet and maintain a exercise routine for 150 minutes per week.

## 2022-10-29 NOTE — Assessment & Plan Note (Signed)
Trial on oxybutynin 10 mg at bedtime Discussed non-pharmacological interventions such as pelvic floor muscle training with Kegel exercises, behavioral training -gradually hold urine for longer periods, schedule toilet trip, to urinate every two to four hours rather than waiting for the need to go. Avoid or cut back on alcohol, caffeine or acidic foods.

## 2022-10-29 NOTE — Assessment & Plan Note (Signed)
Ear wax removal done today Debrox 6.5 otic solution PRN

## 2022-10-30 LAB — CMP14+EGFR
ALT: 32 [IU]/L (ref 0–32)
AST: 39 [IU]/L (ref 0–40)
Albumin: 4.2 g/dL (ref 3.7–4.7)
Alkaline Phosphatase: 79 [IU]/L (ref 44–121)
BUN/Creatinine Ratio: 12 (ref 12–28)
BUN: 9 mg/dL (ref 8–27)
Bilirubin Total: 0.4 mg/dL (ref 0.0–1.2)
CO2: 21 mmol/L (ref 20–29)
Calcium: 9.8 mg/dL (ref 8.7–10.3)
Chloride: 106 mmol/L (ref 96–106)
Creatinine, Ser: 0.74 mg/dL (ref 0.57–1.00)
Globulin, Total: 2.1 g/dL (ref 1.5–4.5)
Glucose: 105 mg/dL — ABNORMAL HIGH (ref 70–99)
Potassium: 4.7 mmol/L (ref 3.5–5.2)
Sodium: 143 mmol/L (ref 134–144)
Total Protein: 6.3 g/dL (ref 6.0–8.5)
eGFR: 78 mL/min/{1.73_m2} (ref >59)

## 2022-10-30 LAB — HEMOGLOBIN A1C
Est. average glucose Bld gHb Est-mCnc: 126 mg/dL
Hgb A1c MFr Bld: 6 % — ABNORMAL HIGH (ref 4.8–5.6)

## 2022-10-30 LAB — TSH+FREE T4
Free T4: 1.53 ng/dL (ref 0.82–1.77)
TSH: 3.21 u[IU]/mL (ref 0.450–4.500)

## 2022-10-30 LAB — LIPID PANEL
Chol/HDL Ratio: 4.3 {ratio} (ref 0.0–4.4)
Cholesterol, Total: 187 mg/dL (ref 100–199)
HDL: 44 mg/dL (ref >39)
LDL Chol Calc (NIH): 115 mg/dL — ABNORMAL HIGH (ref 0–99)
Triglycerides: 159 mg/dL — ABNORMAL HIGH (ref 0–149)
VLDL Cholesterol Cal: 28 mg/dL (ref 5–40)

## 2022-10-30 LAB — VITAMIN D 25 HYDROXY (VIT D DEFICIENCY, FRACTURES): Vit D, 25-Hydroxy: 17.1 ng/mL — ABNORMAL LOW (ref 30.0–100.0)

## 2022-10-30 LAB — VITAMIN B12: Vitamin B-12: 276 pg/mL (ref 232–1245)

## 2022-10-31 ENCOUNTER — Other Ambulatory Visit: Payer: Self-pay | Admitting: Family Medicine

## 2022-10-31 LAB — URINE CULTURE

## 2022-10-31 MED ORDER — ROSUVASTATIN CALCIUM 5 MG PO TABS: 5.0000 mg | ORAL_TABLET | Freq: Every day | ORAL | 3 refills | Status: DC

## 2022-10-31 NOTE — Progress Notes (Signed)
Please inform patient,  Vitamin D levels low, I advise to taking  over the counter supplements of vitamin D 1000 IU/day to prevent low vitamin D levels. Consuming Vitamin D rich food sources include fish, salmon, sardines, egg yolks, red meat, liver, oranges, soy milk.   Hemoglobin A1c 6.0 indicates prediabetes - no medication intervention just lifestyle changes   It is important to follow a DASH diet which includes vegetables,fruits,whole grains, fat free or low fat diary,fish,poultry,beans,nuts and seeds,vegetable oils. Find an activity that you will enjoyandstart to be active at least 5 days a week for 30 minutes each day.      Cholesterol levels elevated, Advise lifestyle modifications follow diet low in saturated fat, reduce dietary salt intake, avoid fatty foods, maintain an exercise routine 3 to 5 days a week for a minimum total of 150 minutes.

## 2022-11-10 ENCOUNTER — Ambulatory Visit: Payer: Medicare HMO | Admitting: Family Medicine

## 2022-11-17 ENCOUNTER — Encounter: Payer: Self-pay | Admitting: Podiatry

## 2022-11-17 ENCOUNTER — Ambulatory Visit: Payer: Medicare HMO | Admitting: Podiatry

## 2022-11-17 DIAGNOSIS — M79674 Pain in right toe(s): Secondary | ICD-10-CM | POA: Diagnosis not present

## 2022-11-17 DIAGNOSIS — I739 Peripheral vascular disease, unspecified: Secondary | ICD-10-CM

## 2022-11-17 DIAGNOSIS — M79675 Pain in left toe(s): Secondary | ICD-10-CM | POA: Diagnosis not present

## 2022-11-17 DIAGNOSIS — B351 Tinea unguium: Secondary | ICD-10-CM

## 2022-11-17 NOTE — Progress Notes (Signed)
Subjective:  Patient ID: Nancy Schroeder, female    DOB: 03-02-1933,  MRN: [id number]  Nancy Schroeder presents to clinic today for at risk foot care. Patient has h/o PAD  Chief Complaint  Patient presents with   RFC    RFC- Patient has no concerns at this time   New problem(s): None.   PCP is Del Newman Nip, Tenna Child, FNP.  Allergies  Allergen Reactions   Amoxicillin Hives   Augmentin [Amoxicillin-Pot Clavulanate] Hives    Review of Systems: Negative except as noted in the HPI.  Objective: No changes noted in today's physical examination. There were no vitals filed for this visit. Nancy Schroeder is a pleasant 89 y.o. female WD, WN in NAD. AAO x 3.  Vascular Examination: CFT <3 seconds b/l. DP/PT pulses faintly palpable b/l. Skin temperature gradient warm to warm b/l. No pain with calf compression. No ischemia or gangrene. No cyanosis or clubbing noted b/l. No edema noted b/l LE.   Neurological Examination: Sensation grossly intact b/l with 10 gram monofilament. Vibratory sensation intact b/l.   Dermatological Examination: Pedal skin warm and supple b/l.   No open wounds. No interdigital macerations.  Toenails 1-5 b/l thick, discolored, elongated with subungual debris and pain on dorsal palpation.    No hyperkeratotic nor porokeratotic lesions present on today's visit.  Musculoskeletal Examination: Muscle strength 5/5 to b/l LE. HAV with bunion bilaterally and hammertoes 2-5 b/l.  Radiographs: None  Lab Results  Component Value Date   HGBA1C 6.0 (H) 10/29/2022   Assessment/Plan: 1. Pain due to onychomycosis of toenails of both feet   2. Peripheral vascular disease (HCC)     -Patient was evaluated and treated. All patient's and/or POA's questions/concerns answered on today's visit. -Patient to continue soft, supportive shoe gear daily. -Toenails 1-5 b/l were debrided in length and girth with sterile nail nippers and dremel without iatrogenic bleeding.   -Patient/POA to call should there be question/concern in the interim.   Return in about 3 months (around 02/17/2023).  [name], DPM

## 2022-11-18 NOTE — Progress Notes (Signed)
Patient Office Visit   Subjective   Patient ID: Nancy Schroeder, female    DOB: 1933/08/27  Age: 89 y.o. MRN: [id number]  CC:  Chief Complaint  Patient presents with   Hypertension    Follow up visit    Fatigue    Son states she always wants to stay in bed     HPI Nancy Schroeder 89 year old female, presents to the clinic for worsening fatigue. She  has a past medical history of Anxiety, Depression, Essential hypertension, and H/O back injury (2016).  The patient reports experiencing fatigue for several years, which has progressively worsened, feeling as though it is "pulling them down." The fatigue had a sudden onset and is constant throughout the day, with no particular time of day being worse. On a scale of 1 to 10, the patient rates their fatigue as a 10, and it significantly interferes with daily activities, leaving them unmotivated to do anything. This fatigue has also impacted their ability to work, exercise, and complete everyday tasks. In addition to fatigue, the patient experiences daily headaches  Regarding sleep, the patient reports getting about six hours of sleep per night and feels rested upon waking. However, they find themselves more tired after light activities than they used to be. The patient confirms that they are eating regularly, staying hydrated, and not taking any medications or substances like caffeine or alcohol that might be affecting their energy levels.      Outpatient Encounter Medications as of 11/19/2022  Medication Sig   amLODipine (NORVASC) 5 MG tablet Take 1 tablet (5 mg total) by mouth daily. Hypertension   aspirin EC 81 MG tablet Take 81 mg by mouth daily. Swallow whole.   busPIRone (BUSPAR) 10 MG tablet Take 1 tablet (10 mg total) by mouth daily.   carbamide peroxide (DEBROX) 6.5 % OTIC solution Place 2 drops into both ears 2 (two) times daily as needed.   cyanocobalamin (VITAMIN B12) 1000 MCG tablet Take 1 tablet (1,000 mcg total) by  mouth daily.   DULoxetine (CYMBALTA) 20 MG capsule Take 1 capsule (20 mg total) by mouth daily.   hydrochlorothiazide (HYDRODIURIL) 25 MG tablet Take 1 tablet (25 mg total) by mouth daily. Hypertension   ketotifen (EYE ITCH RELIEF) 0.035 % ophthalmic solution Place 1 drop into both eyes 2 (two) times daily as needed.   levothyroxine (SYNTHROID) 25 MCG tablet TAKE 1 TABLET(25 MCG) BY MOUTH DAILY BEFORE BREAKFAST   lidocaine (XYLOCAINE) 5 % ointment APPLY 1 APPLICATION TOPICALLY AS NEEDED.   Lidocaine 5 % CREA Apply 1 Application topically at bedtime as needed.   lisinopril (ZESTRIL) 40 MG tablet Take 1 tablet (40 mg total) by mouth daily. Hypertension   oxybutynin (DITROPAN-XL) 10 MG 24 hr tablet Take 1 tablet (10 mg total) by mouth at bedtime. For urinary incontinence   rosuvastatin (CRESTOR) 5 MG tablet Take 1 tablet (5 mg total) by mouth daily.   carbamazepine (TEGRETOL) 200 MG tablet Take 1 tablet (200 mg total) by mouth in the morning and at bedtime.   [DISCONTINUED] gabapentin (NEURONTIN) 300 MG capsule Take 1 capsule (300 mg total) by mouth 3 (three) times daily. (Patient not taking: Reported on 10/29/2022)   No facility-administered encounter medications on file as of 11/19/2022.    Past Surgical History:  Procedure Laterality Date   ABDOMINAL HYSTERECTOMY     total   CATARACT EXTRACTION, BILATERAL  01/2020   CHOLECYSTECTOMY     NECK SURGERY  Review of Systems  Constitutional:  Positive for malaise/fatigue. Negative for chills and fever.  Eyes:  Negative for blurred vision.  Respiratory:  Negative for shortness of breath.   Cardiovascular:  Negative for chest pain.  Neurological:  Positive for headaches. Negative for dizziness, tingling, tremors, speech change and focal weakness.  Psychiatric/Behavioral:  Positive for depression. The patient is nervous/anxious.       Objective    BP (!) 156/81   Pulse 92   Resp 16   Ht 5\' 1"  (1.549 m)   Wt 175 lb (79.4 kg)   SpO2  97%   BMI 33.07 kg/m   Physical Exam Vitals reviewed.  Constitutional:      General: She is not in acute distress.    Appearance: Normal appearance. She is not ill-appearing, toxic-appearing or diaphoretic.  HENT:     Head: Normocephalic.  Eyes:     General:        Right eye: No discharge.        Left eye: No discharge.     Conjunctiva/sclera: Conjunctivae normal.  Cardiovascular:     Rate and Rhythm: Normal rate.     Pulses: Normal pulses.     Heart sounds: Normal heart sounds.  Pulmonary:     Effort: Pulmonary effort is normal. No respiratory distress.     Breath sounds: Normal breath sounds.  Abdominal:     Palpations: Abdomen is soft.     Tenderness: There is no abdominal tenderness. There is no guarding.  Musculoskeletal:     Cervical back: Normal range of motion.  Skin:    General: Skin is warm and dry.     Capillary Refill: Capillary refill takes less than 2 seconds.  Neurological:     Mental Status: She is alert and oriented to person, place, and time.  Psychiatric:        Mood and Affect: Mood normal.       Assessment & Plan:  Other fatigue Assessment & Plan: Can be related to depression, possible increased on Buspar medication, patient wants to wait on labs results before increasing dosage Flowsheet Row Office Visit from 11/19/2022 in [facility]  PHQ-9 Total Score 15      Low vit D, TSH normal on daily synthroid, BMP normal Iron panel and Vit D labs ordered in today's visit Discussed focus on good self-care habits. Get regular, quality sleep by sticking to a consistent schedule. Light exercise like walking or stretching can improve energy levels. Eat a balanced diet, stay hydrated, and avoid too much caffeine or sugar. Take breaks during tasks to pace yourself and prevent burnout.    Orders: -     Iron, TIBC and Ferritin Panel -     VITAMIN D 25 Hydroxy (Vit-D Deficiency, Fractures) -     CBC with Differential/Platelet  Iron  deficiency anemia, unspecified iron deficiency anemia type -     Iron, TIBC and Ferritin Panel    Return if symptoms worsen or fail to improve.   Cruzita Lederer Newman Nip, FNP

## 2022-11-18 NOTE — Patient Instructions (Signed)

## 2022-11-19 ENCOUNTER — Telehealth: Payer: Self-pay | Admitting: Family Medicine

## 2022-11-19 ENCOUNTER — Ambulatory Visit (INDEPENDENT_AMBULATORY_CARE_PROVIDER_SITE_OTHER): Payer: Medicare HMO | Admitting: Family Medicine

## 2022-11-19 ENCOUNTER — Encounter: Payer: Self-pay | Admitting: Family Medicine

## 2022-11-19 VITALS — BP 156/81 | HR 92 | Resp 16 | Ht 61.0 in | Wt 175.0 lb

## 2022-11-19 DIAGNOSIS — G8929 Other chronic pain: Secondary | ICD-10-CM | POA: Diagnosis not present

## 2022-11-19 DIAGNOSIS — E559 Vitamin D deficiency, unspecified: Secondary | ICD-10-CM | POA: Diagnosis not present

## 2022-11-19 DIAGNOSIS — R5383 Other fatigue: Secondary | ICD-10-CM | POA: Diagnosis not present

## 2022-11-19 DIAGNOSIS — D509 Iron deficiency anemia, unspecified: Secondary | ICD-10-CM

## 2022-11-19 NOTE — Assessment & Plan Note (Addendum)
Can be related to depression, possible increased on Buspar medication, patient wants to wait on labs results before increasing dosage Flowsheet Row Office Visit from 11/19/2022 in [facility]  PHQ-9 Total Score 15      Low vit D, TSH normal on daily synthroid, BMP normal Iron panel and Vit D labs ordered in today's visit Discussed focus on good self-care habits. Get regular, quality sleep by sticking to a consistent schedule. Light exercise like walking or stretching can improve energy levels. Eat a balanced diet, stay hydrated, and avoid too much caffeine or sugar. Take breaks during tasks to pace yourself and prevent burnout.

## 2022-11-19 NOTE — Telephone Encounter (Signed)
Son called back to let provider know patient is taking Vitamin  D 125 mg

## 2022-11-20 LAB — CBC WITH DIFFERENTIAL/PLATELET
Basophils Absolute: 0.1 10*3/uL (ref 0.0–0.2)
Basos: 1 %
EOS (ABSOLUTE): 0.2 10*3/uL (ref 0.0–0.4)
Eos: 3 %
Hematocrit: 37.9 % (ref 34.0–46.6)
Hemoglobin: 12.2 g/dL (ref 11.1–15.9)
Immature Grans (Abs): 0 10*3/uL (ref 0.0–0.1)
Immature Granulocytes: 0 %
Lymphocytes Absolute: 1.7 10*3/uL (ref 0.7–3.1)
Lymphs: 22 %
MCH: 30.2 pg (ref 26.6–33.0)
MCHC: 32.2 g/dL (ref 31.5–35.7)
MCV: 94 fL (ref 79–97)
Monocytes Absolute: 0.6 10*3/uL (ref 0.1–0.9)
Monocytes: 8 %
Neutrophils Absolute: 5.3 10*3/uL (ref 1.4–7.0)
Neutrophils: 66 %
Platelets: 314 10*3/uL (ref 150–450)
RBC: 4.04 x10E6/uL (ref 3.77–5.28)
RDW: 12.2 % (ref 11.7–15.4)
WBC: 7.9 10*3/uL (ref 3.4–10.8)

## 2022-11-20 LAB — IRON,TIBC AND FERRITIN PANEL
Ferritin: 30 ng/mL (ref 15–150)
Iron Saturation: 27 % (ref 15–55)
Iron: 94 ug/dL (ref 27–139)
Total Iron Binding Capacity: 342 ug/dL (ref 250–450)
UIBC: 248 ug/dL (ref 118–369)

## 2022-11-20 LAB — VITAMIN D 25 HYDROXY (VIT D DEFICIENCY, FRACTURES): Vit D, 25-Hydroxy: 16.2 ng/mL — ABNORMAL LOW (ref 30.0–100.0)

## 2022-11-21 ENCOUNTER — Other Ambulatory Visit: Payer: Self-pay | Admitting: Family Medicine

## 2022-11-21 MED ORDER — BUSPIRONE HCL 30 MG PO TABS: 30.0000 mg | ORAL_TABLET | Freq: Every day | ORAL | 3 refills | Status: AC

## 2022-11-21 MED ORDER — VITAMIN D3 50 MCG (2000 UT) PO CAPS: 2000.0000 [IU] | ORAL_CAPSULE | Freq: Every day | ORAL | 2 refills | Status: AC

## 2022-11-21 NOTE — Progress Notes (Signed)
Please inform patient,   Iron levels normal  Vitamin D levels low,   I increased her units to 2000IU to be taken daily  Vitamin D rich food sources include fish, salmon, sardines, egg yolks, red meat, liver, oranges, soy milk.    I increase her Buspar anxiety medication to 30 mg  be taken once daily    Start  establishing a daily routine, going outdoors, exercise, healthy eating habits, mindfulness and mediatation.

## 2022-11-22 NOTE — Telephone Encounter (Signed)
Thanks

## 2023-01-02 IMAGING — MR MR HEAD W/O CM
11 of 12 series · 35 of 48 positions shown · non-contrast
Comparison: None.

CLINICAL DATA: Headaches and difficulty walking.

EXAM:
MRI HEAD WITHOUT CONTRAST
MRA HEAD WITHOUT CONTRAST
TECHNIQUE: Multiplanar, multi-echo pulse sequences of the brain and surrounding
structures were acquired without intravenous contrast. Angiographic
images of the Circle of Willis were acquired using MRA technique
without intravenous contrast.

[Series 5: DWI · axial · 3.0mm · 0.73mm/px · z∈[-56,+90]mm · 5 of 50 slices shown (1 of 4)]
[im 1/50]
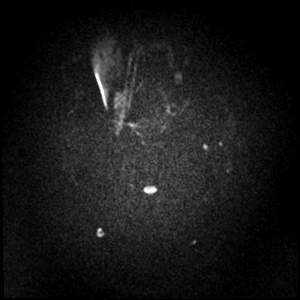
[im 13/50]
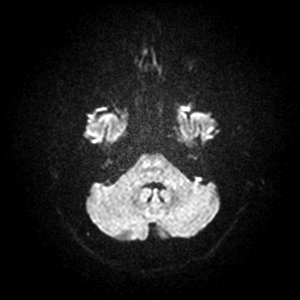
[im 25/50]
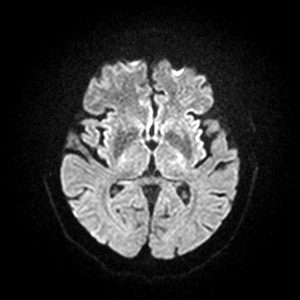
[im 37/50]
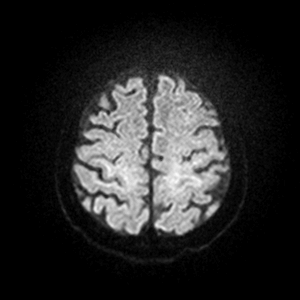
[im 50/50]
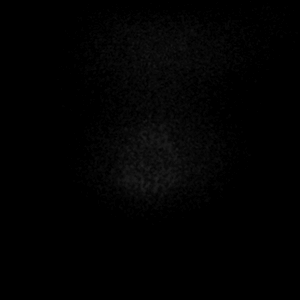

[Series 6: DWI · axial · 3.0mm · 0.73mm/px · z∈[-56,+90]mm · 4 of 50 slices shown (2 of 4)]
[im 1/50]
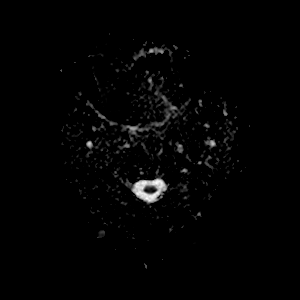
[im 17/50]
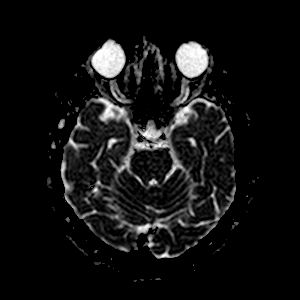
[im 33/50]
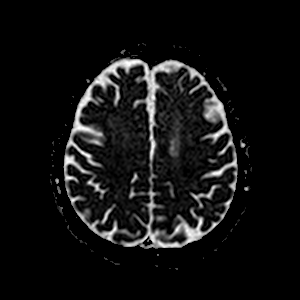
[im 50/50]
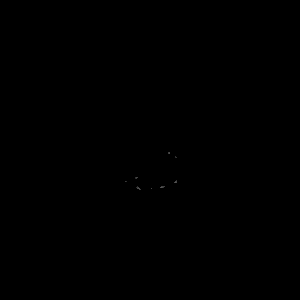

[Series 7: T1 · sagittal · 5.0mm · 0.72mm/px · 2 of 21 slices shown]
[im 1/21]
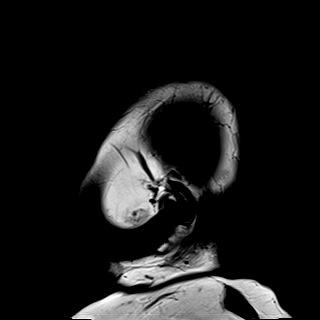
[im 21/21]
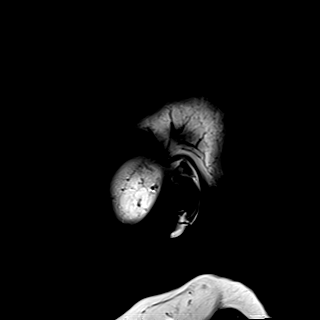

[Series 8: DWI · coronal · 5.0mm · 0.85mm/px · 2 of 30 slices shown (3 of 4)]
[im 1/30]
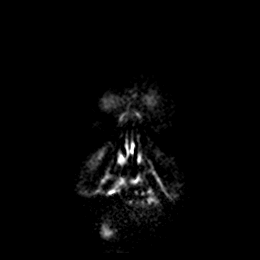
[im 30/30]
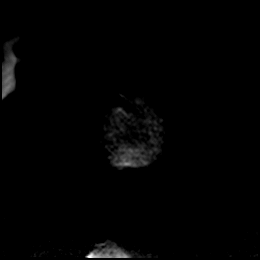

[Series 9: DWI · coronal · 5.0mm · 0.85mm/px · 2 of 30 slices shown (4 of 4)]
[im 1/30]
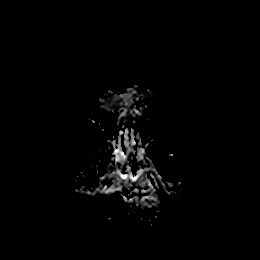
[im 30/30]
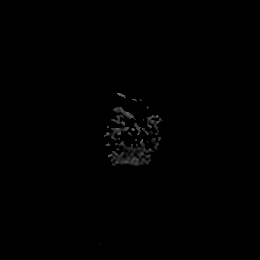

[Series 10: T2 · axial · 5.0mm · 0.69mm/px · z∈[-56,+90]mm · 2 of 22 slices shown (1 of 2)]
[im 1/22]
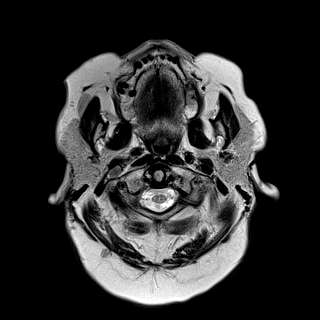
[im 22/22]
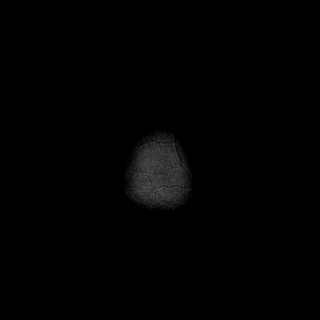

[Series 11: mag_images · axial · 3.0mm · 0.90mm/px · z∈[-52,+88]mm · 4 of 48 slices shown]
[im 1/48]
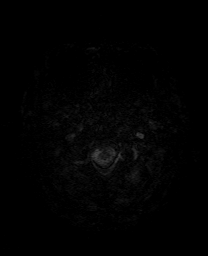
[im 16/48]
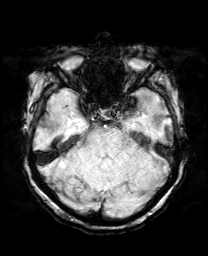
[im 32/48]
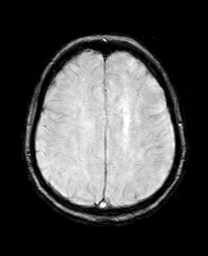
[im 48/48]
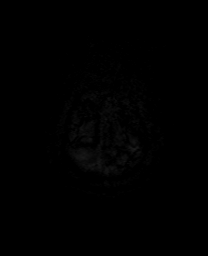

[Series 12: pha_images · axial · 3.0mm · 0.90mm/px · z∈[-52,+88]mm · 4 of 48 slices shown]
[im 1/48]
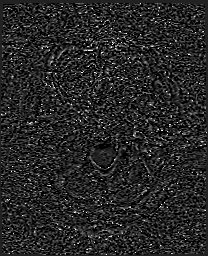
[im 16/48]
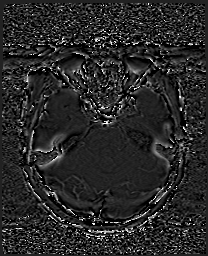
[im 32/48]
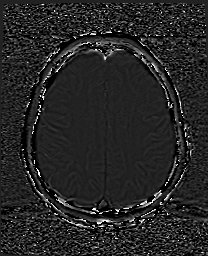
[im 48/48]
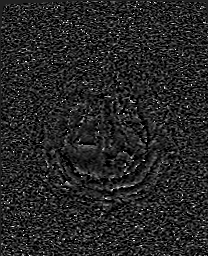

[Series 13: swi_images · axial · 3.0mm · 0.90mm/px · z∈[-52,+88]mm · 4 of 48 slices shown]
[im 1/48]
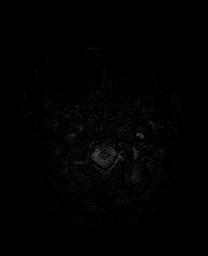
[im 16/48]
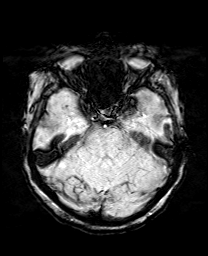
[im 32/48]
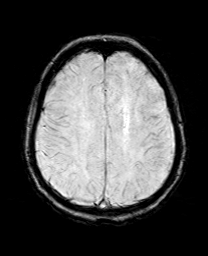
[im 48/48]
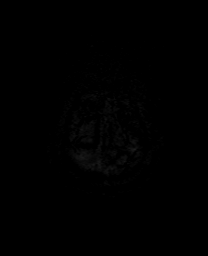

[Series 15: FLAIR · axial · 3.0mm · 0.43mm/px · z∈[-51,+89]mm · 4 of 48 slices shown]
[im 1/48]
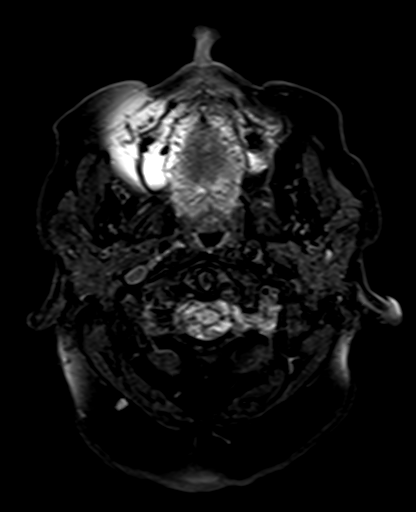
[im 16/48]
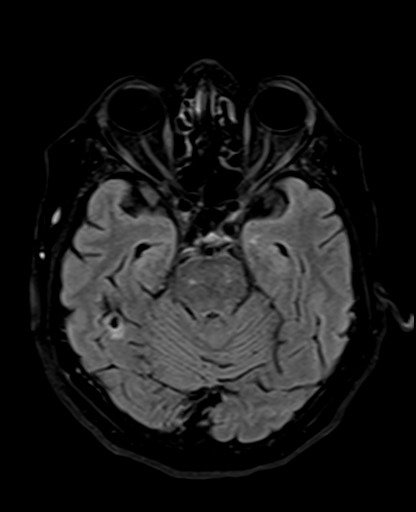
[im 32/48]
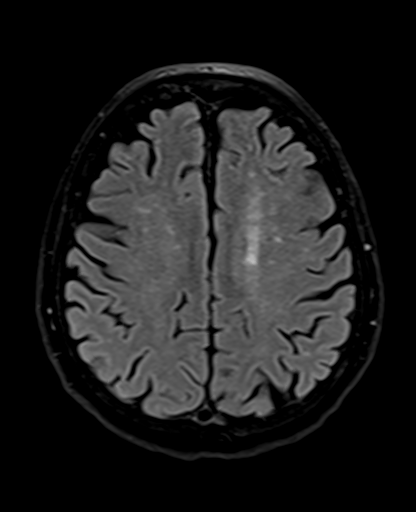
[im 48/48]
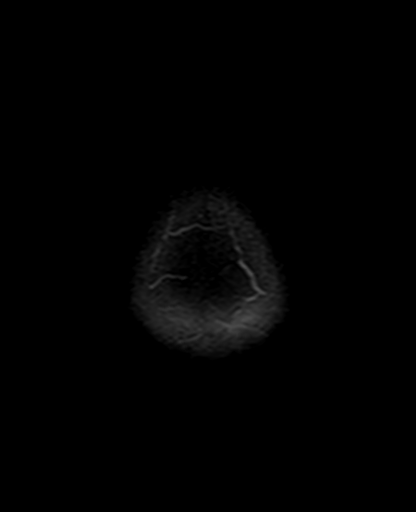

[Series 17: T2 · coronal · 5.0mm · 0.69mm/px · 2 of 30 slices shown (2 of 2)]
[im 1/30]
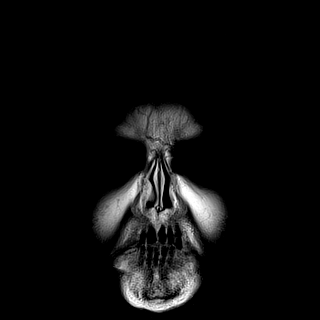
[im 30/30]
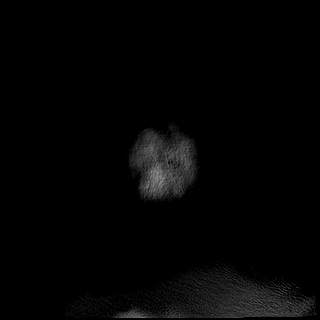

[35 of 48 positions shown; findings below may reference images not displayed]

FINDINGS: MRI HEAD FINDINGS

Brain: There is no acute infarction or intracranial hemorrhage.
There is no intracranial mass, mass effect, or edema. There is no
hydrocephalus or extra-axial fluid collection. Patchy T2
hyperintensity in the supratentorial and pontine white matter is
nonspecific but may reflect mild chronic microvascular ischemic
changes. Chronic infarcts of the inferior right temporal lobe,
bilateral basal ganglia, and inferior left cerebellum. Possible
gliosis along the ventral medulla may be artifactual.

Vascular: Major vessel flow voids at the skull base are preserved.

Skull and upper cervical spine: Normal marrow signal is preserved.

Sinuses/Orbits: Paranasal sinuses are aerated. Bilateral lens
replacements.

Other: Sella is unremarkable.  Mastoid air cells are clear.

MRA HEAD FINDINGS

Anterior circulation: Intracranial internal carotid arteries are
patent. Anterior and middle cerebral arteries are patent.

Posterior circulation: Intracranial vertebral arteries are patent.
Basilar artery is patent. Posterior cerebral arteries are patent. A
right posterior communicating artery is present.
IMPRESSION: No evidence of recent infarction, hemorrhage, or mass.

Mild chronic microvascular ischemic changes. Few chronic infarcts
detailed above.

No occlusion or significant stenosis.

## 2023-01-11 ENCOUNTER — Ambulatory Visit: Payer: Medicare HMO | Admitting: Neurology

## 2023-01-13 ENCOUNTER — Other Ambulatory Visit: Payer: Self-pay | Admitting: Internal Medicine

## 2023-01-13 DIAGNOSIS — M5416 Radiculopathy, lumbar region: Secondary | ICD-10-CM

## 2023-01-18 ENCOUNTER — Ambulatory Visit (INDEPENDENT_AMBULATORY_CARE_PROVIDER_SITE_OTHER): Payer: Medicare HMO | Admitting: Orthopedic Surgery

## 2023-01-18 ENCOUNTER — Encounter: Payer: Self-pay | Admitting: Orthopedic Surgery

## 2023-01-18 VITALS — BP 187/79 | HR 83 | Ht 61.0 in | Wt 175.0 lb

## 2023-01-18 DIAGNOSIS — M79604 Pain in right leg: Secondary | ICD-10-CM

## 2023-01-18 DIAGNOSIS — M79605 Pain in left leg: Secondary | ICD-10-CM | POA: Diagnosis not present

## 2023-01-18 MED ORDER — PREDNISONE 10 MG (21) PO TBPK: ORAL_TABLET | ORAL | 0 refills | Status: DC

## 2023-01-18 MED ORDER — PREGABALIN 50 MG PO CAPS
50.0000 mg | ORAL_CAPSULE | Freq: Two times a day (BID) | ORAL | 0 refills | Status: DC
Start: 2023-01-18 — End: 2023-03-02

## 2023-01-18 NOTE — Progress Notes (Signed)
New Patient Visit  Assessment: Nancy Schroeder is a 89 y.o. female with the following: Bilateral lower extremity burning pain  Plan: Nancy Schroeder has pain in both legs.  It is difficult to fully assess what is bothering her.  She did sustain a fall approximately 1 year ago.  Since then, she has difficulty walking.  She has worked with home health physical therapy, but not in the past 6 months.  Description of her pain is consistent with nerve type pain and/or neuropathy.  She has previously tried gabapentin, but did not tolerate this.  She has been getting treatments from chiropractor, which has improved her ability to ambulate.  She does appear to be weak when walking.  At this point, I would like to try and improve the burning pains.  I have offered her a prednisone Dosepak, as well as some Lyrica.  I do think she would benefit from outpatient physical therapy.  She may not be able to tolerate this, nor is it convenient.  If she continues to have issues, we may refer her to a spine surgeon, as she has an MRI of the lumbar spine demonstrating some areas of stenosis.  She states her understanding.  She will return to clinic as needed.  Follow-up: Return if symptoms worsen or fail to improve.  Subjective:  Chief Complaint  Patient presents with   Knee Pain    Bilat knees R > L. Pt had decompression of spine done and hasn't been able to walk good since.     History of Present Illness: Nancy Schroeder is a 89 y.o. female who has been referred by  Rica Records, FNP for evaluation of bilateral lower back pain.  She presents to clinic today and is complaining of bilateral leg pain.  She describes the pain as a burning sensation.  She has noted occasional catching sensations.  No specific injury, but she did sustain a fall about a year ago.  She has been evaluated by neurology.  She had home health physical therapy until about 6 months ago.  At that point her insurance had  exhausted all visits.  Therapy also told her there is nothing more they can do.  She has recently been seeing a chiropractor, and undergoing decompression and stretching therapy treatments.  She notes improved ability to walk following these treatments.  She had some e-stim at this treatment yesterday, and she notes some worsening muscle pain in the lower back.  She is tried oxycodone and gabapentin, but did not tolerate either of these medicines.  She states that a lot of these issues started in 2016, when she fell at [employer] while working.  She underwent a neck surgery and has had issues ever since.   Review of Systems: No fevers or chills  No numbness or tingling No chest pain No shortness of breath No bowel or bladder dysfunction No GI distress No headaches   Medical History:  Past Medical History:  Diagnosis Date   Anxiety    Depression    Essential hypertension    H/O back injury 2016    Past Surgical History:  Procedure Laterality Date   ABDOMINAL HYSTERECTOMY     total   CATARACT EXTRACTION, BILATERAL  01/2020   CHOLECYSTECTOMY     NECK SURGERY      Family History  Problem Relation Age of Onset   Diabetes Mother    Diabetes Father    Diabetes Sister    Social History   Tobacco Use  Smoking status: Never   Smokeless tobacco: Never  Vaping Use   Vaping status: Never Used  Substance Use Topics   Alcohol use: Yes    Comment: 1-2 drinks per 6 months   Drug use: Never    Allergies  Allergen Reactions   Amoxicillin Hives   Augmentin [Amoxicillin-Pot Clavulanate] Hives    Current Meds  Medication Sig   predniSONE (STERAPRED UNI-PAK 21 TAB) 10 MG (21) TBPK tablet 10 mg DS 12 as directed   pregabalin (LYRICA) 50 MG capsule Take 1 capsule (50 mg total) by mouth 2 (two) times daily.   amLODipine (NORVASC) 5 MG tablet Take 1 tablet (5 mg total) by mouth daily. Hypertension   aspirin EC 81 MG tablet Take 81 mg by mouth daily. Swallow whole.   busPIRone  (BUSPAR) 30 MG tablet Take 1 tablet (30 mg total) by mouth daily.   carbamide peroxide (DEBROX) 6.5 % OTIC solution Place 2 drops into both ears 2 (two) times daily as needed.   Cholecalciferol (VITAMIN D3) 50 MCG (2000 UT) capsule Take 1 capsule (2,000 Units total) by mouth daily.   cyanocobalamin (VITAMIN B12) 1000 MCG tablet Take 1 tablet (1,000 mcg total) by mouth daily.   DULoxetine (CYMBALTA) 20 MG capsule Take 1 capsule (20 mg total) by mouth daily.   hydrochlorothiazide (HYDRODIURIL) 25 MG tablet Take 1 tablet (25 mg total) by mouth daily. Hypertension   ketotifen (EYE ITCH RELIEF) 0.035 % ophthalmic solution Place 1 drop into both eyes 2 (two) times daily as needed.   levothyroxine (SYNTHROID) 25 MCG tablet TAKE 1 TABLET(25 MCG) BY MOUTH DAILY BEFORE BREAKFAST   lidocaine (XYLOCAINE) 5 % ointment APPLY 1 APPLICATION TOPICALLY AS NEEDED.   Lidocaine 5 % CREA Apply 1 Application topically at bedtime as needed.   lisinopril (ZESTRIL) 40 MG tablet Take 1 tablet (40 mg total) by mouth daily. Hypertension   oxybutynin (DITROPAN-XL) 10 MG 24 hr tablet Take 1 tablet (10 mg total) by mouth at bedtime. For urinary incontinence   rosuvastatin (CRESTOR) 5 MG tablet Take 1 tablet (5 mg total) by mouth daily.    Objective: BP (!) 187/79   Pulse 83   Ht 5\' 1"  (1.549 m)   Wt 175 lb (79.4 kg)   BMI 33.07 kg/m   Physical Exam:  General: Elderly female., Alert and oriented., No acute distress., and Seated in a wheelchair. Gait: Ambulates with the assistance of a walker.  Evaluation of bilateral lower extremities demonstrates no atrophy.  Sensation is intact.  Negative straight leg raise bilaterally.  She has good lower body strength.  Sensation intact to the dorsum of the feet.  IMAGING: I personally reviewed images previously obtained in clinic  X-ray of the lumbar spine, as well as MRI was previously obtained.   IMPRESSION: Multilevel degenerative changes of the lumbar spine with  varying degrees of mild to moderate subarticular and neural foraminal stenoses, as summarized below. No high-grade spinal canal stenosis.   L1-L2: Mild bilateral neural foraminal stenosis. No significant spinal canal stenosis.   L2-L3: Moderate left and mild right subarticular and neural foraminal stenosis. Abutment of the descending left L3 nerve root.   L3-L4: Mild left-sided neural foraminal stenosis. No spinal canal stenosis.   L4-L5: Moderate right and mild left subarticular narrowing, encroaching the descending right L5 nerve root. No significant neural foraminal stenosis.   L5-S1: Mild, left greater than right subarticular narrowing encroaching the descending left S1 nerve root. Moderate left and mild right neural foraminal stenosis.  New Medications:  Meds ordered this encounter  Medications   predniSONE (STERAPRED UNI-PAK 21 TAB) 10 MG (21) TBPK tablet    Sig: 10 mg DS 12 as directed    Dispense:  48 tablet    Refill:  0   pregabalin (LYRICA) 50 MG capsule    Sig: Take 1 capsule (50 mg total) by mouth 2 (two) times daily.    Dispense:  60 capsule    Refill:  0      Oliver Barre, MD  01/18/2023 12:53 PM

## 2023-02-10 ENCOUNTER — Ambulatory Visit: Payer: Medicare HMO | Admitting: Neurology

## 2023-02-10 ENCOUNTER — Encounter: Payer: Self-pay | Admitting: Neurology

## 2023-02-10 VITALS — BP 150/80 | HR 80 | Ht 60.0 in | Wt 174.0 lb

## 2023-02-10 DIAGNOSIS — M545 Low back pain, unspecified: Secondary | ICD-10-CM

## 2023-02-10 DIAGNOSIS — M79605 Pain in left leg: Secondary | ICD-10-CM

## 2023-02-10 DIAGNOSIS — M5416 Radiculopathy, lumbar region: Secondary | ICD-10-CM | POA: Diagnosis not present

## 2023-02-10 DIAGNOSIS — G8929 Other chronic pain: Secondary | ICD-10-CM

## 2023-02-10 DIAGNOSIS — G609 Hereditary and idiopathic neuropathy, unspecified: Secondary | ICD-10-CM | POA: Diagnosis not present

## 2023-02-10 DIAGNOSIS — M79604 Pain in right leg: Secondary | ICD-10-CM

## 2023-02-10 MED ORDER — LIDOCAINE 5 % EX OINT: 1.0000 | TOPICAL_OINTMENT | CUTANEOUS | 0 refills | Status: DC | PRN

## 2023-02-10 NOTE — Progress Notes (Signed)
 [facility]  PATIENT: Nancy Schroeder DOB: Apr 08, 1933  REQUESTING CLINICIAN: Del Orbe Polanco, Ilian* HISTORY FROM: Patient and son  REASON FOR VISIT: Follow up, back pan, paresthesia to right leg, neuropathy    HISTORICAL  CHIEF COMPLAINT:  Chief Complaint  Patient presents with   Pain    Rm 16 with son Rona  Pt is well, reports trigeminal neuralgia is stable but she is having pain and tenderness in her legs.   Today February 10, 2023 SS: Here with her son, Ryan. Is a little difficult to determine what her main concern is and timeline. Reports sees chiropractor. Had adjustment in October to her back decompression. Has been a series of adjustments, son claims it has helped her walk again over the last year. Since then reporting more pain to her right leg, around knee, feels like burning. Is in a wheelchair, is not new. Walks with walker at home. Her legs have been weak for quite awhile, year or more. She saw an orthopedic doctor back in Dec a few weeks ago, given prednisone  didn't take it, was given Lyrica  son who giving her one at night last night.  Is not on gabapentin  or cymbalta , claims bad for her memory. Was using Lidocaine  cream with good benefit, would like refill. Denies any recent falls. Her legs are weak after a period of time of standing has done physical therapy without much benefit. Right now pain to right leg, right knee running down the medial right leg. No B/B change, has urinary urgency at baseline.   INTERVAL HISTORY 06/30/2022:  Patient presents today for follow-up, she is accompanied by her son.  Last visit was in June of last year.  Since then she reports that she has been okay in terms of the trigeminal neuralgia but lately her neuropathy has been getting worse.  She reported burning pain in the bilateral legs, stated that nothing can touch her leg.  Her gown or the sheets at night.  She reports that her PCP started her on gabapentin  and she took  the medication for the first time yesterday.  On top of that patient self discontinued her medications including carbamazepine , duloxetine  2 weeks ago.  She was recently seen in the hospital for UTI, and currently undergoing home PT.  Again her pain is constant and worse at night.  INTERVAL HISTORY 07/22/21:  Patient present today for follow-up, at last visit we started him on her on carbamazepine  300 mg twice daily but she said that medication was so strong therefore she decreased it to 200 mg in the morning and if needed 100 mg in the afternoon.  With that dose it seems to control her pain from the trigeminal neuralgia.  She does also report history of back pain and radiculopathy but states that the carbamazepine  helps with the radicular pain.  We also checked her MRI brain and MRA head with no significant abnormality.  HISTORY OF PRESENT ILLNESS:  This is a 89 year old woman with past medical history of anxiety, ?hypothyroidism, hypertension and cataract who is presenting with headache.  Patient said that she had cataract surgery in January 2022, following surgery she started developing lower jaw sharp pain.  Patient said the pain occurs daily, sometime multiple times per day.  It involves the lower jaw sometimes the maxillary area also.  Pain described as sharp shooting pain lasting for few seconds and go away.  On top of this pain, she also have frontal dull headache that can last hours.  She  reported both headaches are new since her surgery.  She has been taking Tylenol  and ibuprofen  with minimal relief.  Has not been on a preventive medical.  She denies any family history of headaches, and denies any previous history of headaches.   Headache History and Characteristics: Onset: January 2022 Location: Frontal headaches and right side lower jaw sharp pain  Quality:  aching and burning  Intensity: 7-8/10.  Duration: last last a few sec (jaw pain) or hours (dull frontal headaches)  Migrainous  Features: No Aura: No  History of brain injury or tumor: No  Family history: No family history of headaches  Motion sickness: no Cardiac history: no  OTC: tylenol , Ibuprofen   Caffeine: 1 to 2 cups of coffee in the morning Sleep: 4 to 5  hrs sleep  Mood/ Stress: Very stress and anxious about her headaches and health in general.   Prior prophylaxis: Propranolol: No  Verapamil:No TCA: No Topamax: No Depakote: No Effexor: No Cymbalta : No Neurontin :No  Prior abortives: Triptan: No Anti-emetic: No Steroids: No Ergotamine suppository: No  OTHER MEDICAL CONDITIONS: HTN, Cataracts, Anxiety    REVIEW OF SYSTEMS: Full 14 system review of systems performed and negative with exception of: as noted in the HPI   ALLERGIES: Allergies  Allergen Reactions   Amoxicillin  Hives   Augmentin [Amoxicillin -Pot Clavulanate] Hives    HOME MEDICATIONS: Outpatient Medications Prior to Visit  Medication Sig Dispense Refill   amLODipine  (NORVASC ) 5 MG tablet Take 1 tablet (5 mg total) by mouth daily. Hypertension 60 tablet 3   aspirin  EC 81 MG tablet Take 81 mg by mouth daily. Swallow whole.     busPIRone  (BUSPAR ) 30 MG tablet Take 1 tablet (30 mg total) by mouth daily. 30 tablet 3   Cholecalciferol (VITAMIN D3) 50 MCG (2000 UT) capsule Take 1 capsule (2,000 Units total) by mouth daily. 30 capsule 2   cyanocobalamin  (VITAMIN B12) 1000 MCG tablet Take 1 tablet (1,000 mcg total) by mouth daily. 90 tablet 1   DULoxetine  (CYMBALTA ) 20 MG capsule Take 1 capsule (20 mg total) by mouth daily. 30 capsule 6   hydrochlorothiazide  (HYDRODIURIL ) 25 MG tablet Take 1 tablet (25 mg total) by mouth daily. Hypertension 30 tablet 3   levothyroxine  (SYNTHROID ) 25 MCG tablet TAKE 1 TABLET(25 MCG) BY MOUTH DAILY BEFORE BREAKFAST 90 tablet 0   lidocaine  (XYLOCAINE ) 5 % ointment APPLY 1 APPLICATION TOPICALLY AS NEEDED. 50 g 0   Lidocaine  5 % CREA Apply 1 Application topically at bedtime as needed. 30 g 1    lisinopril  (ZESTRIL ) 40 MG tablet Take 1 tablet (40 mg total) by mouth daily. Hypertension 90 tablet 1   oxybutynin  (DITROPAN -XL) 10 MG 24 hr tablet Take 1 tablet (10 mg total) by mouth at bedtime. For urinary incontinence 30 tablet 2   rosuvastatin  (CRESTOR ) 5 MG tablet Take 1 tablet (5 mg total) by mouth daily. 90 tablet 3   carbamide peroxide (DEBROX) 6.5 % OTIC solution Place 2 drops into both ears 2 (two) times daily as needed. (Patient not taking: Reported on 02/10/2023) 15 mL 1   ketotifen  (EYE ITCH RELIEF) 0.035 % ophthalmic solution Place 1 drop into both eyes 2 (two) times daily as needed. (Patient not taking: Reported on 02/10/2023) 10 mL 1   predniSONE  (STERAPRED UNI-PAK 21 TAB) 10 MG (21) TBPK tablet 10 mg DS 12 as directed (Patient not taking: Reported on 02/10/2023) 48 tablet 0   pregabalin  (LYRICA ) 50 MG capsule Take 1 capsule (50 mg total) by mouth  2 (two) times daily. (Patient not taking: Reported on 02/10/2023) 60 capsule 0   No facility-administered medications prior to visit.    PAST MEDICAL HISTORY: Past Medical History:  Diagnosis Date   Anxiety    Depression    Essential hypertension    H/O back injury 2016    PAST SURGICAL HISTORY: Past Surgical History:  Procedure Laterality Date   ABDOMINAL HYSTERECTOMY     total   CATARACT EXTRACTION, BILATERAL  01/2020   CHOLECYSTECTOMY     NECK SURGERY      FAMILY HISTORY: Family History  Problem Relation Age of Onset   Diabetes Mother    Diabetes Father    Diabetes Sister     SOCIAL HISTORY: Social History   Socioeconomic History   Marital status: Widowed    Spouse name: Not on file   Number of children: 3   Years of education: Not on file   Highest education level: 8th grade  Occupational History   Occupation: retired  Tobacco Use   Smoking status: Never   Smokeless tobacco: Never  Vaping Use   Vaping status: Never Used  Substance and Sexual Activity   Alcohol use: Yes    Comment: 1-2 drinks per 6 months    Drug use: Never   Sexual activity: Not Currently    Birth control/protection: Surgical, Post-menopausal  Other Topics Concern   Not on file  Social History Narrative   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 Read, tv, putt around, likes astrology      Diet: Eats what she wants. Enjoys veggies and fruits   Caffeine: 1-2 cups of coffee, iced tea   Water: 6-8 cups daily       Wear seatbelt   Does not wear sunscreen   Smoke and carbon monoxide detectors         Social Drivers of Health   Financial Resource Strain: Low Risk  (10/18/2022)   Overall Financial Resource Strain (CARDIA)    Difficulty of Paying Living Expenses: Not hard at all  Food Insecurity: No Food Insecurity (10/18/2022)   Hunger Vital Sign    Worried About Running Out of Food in the Last Year: Never true    Ran Out of Food in the Last Year: Never true  Transportation Needs: No Transportation Needs (10/18/2022)   PRAPARE - Administrator, Civil Service (Medical): No    Lack of Transportation (Non-Medical): No  Physical Activity: Inactive (10/18/2022)   Exercise Vital Sign    Days of Exercise per Week: 0 days    Minutes of Exercise per Session: 0 min  Stress: No Stress Concern Present (10/18/2022)   [employer] of Occupational Health - Occupational Stress Questionnaire    Feeling of Stress : Not at all  Social Connections: Socially Isolated (10/18/2022)   Social Connection and Isolation Panel [NHANES]    Frequency of Communication with Friends and Family: Once a week    Frequency of Social Gatherings with Friends and Family: Once a week    Attends Religious Services: Never    Database Administrator or Organizations: No    Attends Banker Meetings: Never    Marital Status: Widowed   Intimate Partner Violence: Not At Risk (10/18/2022)  Humiliation, Afraid, Rape, and Kick questionnaire    Fear of Current or Ex-Partner: No    Emotionally Abused: No    Physically Abused: No    Sexually Abused: No    PHYSICAL EXAM  GENERAL EXAM/CONSTITUTIONAL: Vitals:  Vitals:   02/10/23 1259  BP: (!) 150/80  Pulse: 80  Weight: 174 lb (78.9 kg)  Height: 5' (1.524 m)    Body mass index is 33.98 kg/m. Wt Readings from Last 3 Encounters:  02/10/23 174 lb (78.9 kg)  01/18/23 175 lb (79.4 kg)  11/19/22 175 lb (79.4 kg)   Patient is in no distress; well developed, nourished and groomed; neck is supple  MUSCULOSKELETAL: Gait, strength, tone, movements noted in Neurologic exam below  NEUROLOGIC: MENTAL STATUS:     10/18/2022    8:52 AM 02/06/2019    1:11 PM  MMSE - Mini Mental State Exam  Not completed: Unable to complete   Orientation to time  5  Orientation to Place  5  Registration  3  Attention/ Calculation  5  Recall  3  Language- name 2 objects  2  Language- repeat  1  Language- follow 3 step command  3  Language- read & follow direction  1  Write a sentence  1  Copy design  1  Total score  30   awake, alert, oriented to person, place and time recent and remote memory intact normal attention and concentration language fluent, comprehension intact, naming intact fund of knowledge appropriate  CRANIAL NERVE:  2nd, 3rd, 4th, 6th -visual fields full to confrontation, extraocular muscles intact, no nystagmus 5th - facial sensation symmetric 7th - facial strength symmetric 8th - hearing intact 11th - shoulder shrug symmetric  MOTOR:  normal bulk and tone, full strength in the BUE, BLE, slight hesitancy right hip flexion due to pain, but is not weak  SENSORY:  normal and symmetric to light touch  COORDINATION:  finger-nose-finger, fine finger movements normal  REFLEXES:  deep tendon reflexes present and symmetric  GAIT/STATION:  Wide-based, can  walk independently in the room cautiously  DIAGNOSTIC DATA (LABS, IMAGING, TESTING) - I reviewed patient records, labs, notes, testing and imaging myself where available.  Lab Results  Component Value Date   WBC 7.9 11/19/2022   HGB 12.2 11/19/2022   HCT 37.9 11/19/2022   MCV 94 11/19/2022   PLT 314 11/19/2022      Component Value Date/Time   NA 143 10/29/2022 1101   K 4.7 10/29/2022 1101   CL 106 10/29/2022 1101   CO2 21 10/29/2022 1101   GLUCOSE 105 (H) 10/29/2022 1101   GLUCOSE 111 (H) 06/14/2022 0448   BUN 9 10/29/2022 1101   CREATININE 0.74 10/29/2022 1101   CREATININE 0.82 03/04/2020 1736   CALCIUM  9.8 10/29/2022 1101   PROT 6.3 10/29/2022 1101   ALBUMIN 4.2 10/29/2022 1101   AST 39 10/29/2022 1101   ALT 32 10/29/2022 1101   ALKPHOS 79 10/29/2022 1101   BILITOT 0.4 10/29/2022 1101   GFRNONAA >60 06/14/2022 0448   GFRNONAA 66 03/04/2020 1736   GFRAA 76 03/04/2020 1736   Lab Results  Component Value Date   CHOL 187 10/29/2022   HDL 44 10/29/2022   LDLCALC 115 (H) 10/29/2022   TRIG 159 (H) 10/29/2022   CHOLHDL 4.3 10/29/2022   Lab Results  Component Value Date   HGBA1C 6.0 (H) 10/29/2022   Lab Results  Component Value Date   VITAMINB12 276 10/29/2022   Lab Results  Component  Value Date   TSH 3.210 10/29/2022   MRI/MRA Brain 01/08/21 No evidence of recent infarction, hemorrhage, or mass. Mild chronic microvascular ischemic changes. Few chronic infarcts detailed above. No occlusion or significant stenosis   MRI of the lumbar spine May 2024  IMPRESSION: Multilevel degenerative changes of the lumbar spine with varying degrees of mild to moderate subarticular and neural foraminal stenoses, as summarized below. No high-grade spinal canal stenosis.   L1-L2: Mild bilateral neural foraminal stenosis. No significant spinal canal stenosis.   L2-L3: Moderate left and mild right subarticular and neural foraminal stenosis. Abutment of the descending left L3  nerve root.   L3-L4: Mild left-sided neural foraminal stenosis. No spinal canal stenosis.   L4-L5: Moderate right and mild left subarticular narrowing, encroaching the descending right L5 nerve root. No significant neural foraminal stenosis.   L5-S1: Mild, left greater than right subarticular narrowing encroaching the descending left S1 nerve root. Moderate left and mild right neural foraminal stenosis.   ASSESSMENT AND PLAN  89 y.o. year old female   1.  Peripheral neuropathy 2.  Chronic low back pain 3.  Lumbar radiculopathy 4.  Gait abnormality  -In December, orthopedist gave a prednisone  taper, she did not take, would recommend trial of this to see if it improves her back pain, right sided radicular symptoms -I will refill her lidocaine  cream -She wishes to remain off gabapentin , Cymbalta , Lyrica  due to concern for potential side effect -She is going to her chiropractor on Monday, I have advised caution with recurrent adjustments.  However, her son is adamant this has been very helpful over the last year to get her walking again -MRI lumbar spine in May 2024 showed multilevel degenerative changes throughout notable at, L2-L3 abutment of descending left L3 nerve root, L4-5 encroachment of the descending right L5 nerve root, at L5-S1 descending left S1 nerve root -We discussed referral to spine specialist to see if she be a candidate for ESI.  Patient is not interested at this time. -Has previously completed physical therapy without reported benefit -She will follow-up at our office as needed, keep close follow-up with primary care  Meds ordered this encounter  Medications   lidocaine  (XYLOCAINE ) 5 % ointment    Sig: Apply 1 Application topically as needed.    Dispense:  50 g    Refill:  0   Lauraine Gayland MANDES, DNP  [facility] [address] [place], [place] [address] [phone]

## 2023-02-10 NOTE — Patient Instructions (Signed)
 We discussed taking prednisone  for acute low back pain Use caution with chiropractor treatment if worsens symptoms Consider referral to spine specialist to see if candidate for epidural steroid injection I will refill your lidocaine  cream If you wish to consider resuming gabapentin  or Cymbalta  let me know Please call for any worsening symptoms.  Keep close follow-up with primary care.

## 2023-02-16 DIAGNOSIS — H524 Presbyopia: Secondary | ICD-10-CM | POA: Diagnosis not present

## 2023-02-22 NOTE — Patient Instructions (Signed)

## 2023-02-22 NOTE — Progress Notes (Unsigned)
   Established Patient Office Visit   Subjective  Patient ID: Nancy Schroeder, female    DOB: Dec 16, 1933  Age: 89 y.o. MRN: [id number]  No chief complaint on file.   She  has a past medical history of Anxiety, Depression, Essential hypertension, and H/O back injury (2016).  HPI  ROS    Objective:     There were no vitals taken for this visit. {Vitals History (Optional):23777}  Physical Exam   No results found for any visits on 02/23/23.  The ASCVD Risk score (Arnett DK, et al., 2019) failed to calculate for the following reasons:   The 2019 ASCVD risk score is only valid for ages 28 to 30    Assessment & Plan:  There are no diagnoses linked to this encounter.  No follow-ups on file.   Cruzita Lederer Newman Nip, FNP

## 2023-02-23 ENCOUNTER — Encounter: Payer: Medicare HMO | Admitting: Family Medicine

## 2023-02-23 DIAGNOSIS — I10 Essential (primary) hypertension: Secondary | ICD-10-CM

## 2023-02-23 DIAGNOSIS — R7303 Prediabetes: Secondary | ICD-10-CM

## 2023-02-23 DIAGNOSIS — E039 Hypothyroidism, unspecified: Secondary | ICD-10-CM

## 2023-02-23 NOTE — Progress Notes (Signed)
 NO SHOW

## 2023-02-28 ENCOUNTER — Ambulatory Visit: Payer: Medicare HMO | Admitting: Family Medicine

## 2023-03-02 ENCOUNTER — Encounter: Payer: Self-pay | Admitting: Podiatry

## 2023-03-02 ENCOUNTER — Ambulatory Visit: Payer: Medicare HMO | Admitting: Podiatry

## 2023-03-02 VITALS — Ht 60.0 in | Wt 174.0 lb

## 2023-03-02 DIAGNOSIS — B351 Tinea unguium: Secondary | ICD-10-CM

## 2023-03-02 DIAGNOSIS — I739 Peripheral vascular disease, unspecified: Secondary | ICD-10-CM

## 2023-03-02 DIAGNOSIS — M79674 Pain in right toe(s): Secondary | ICD-10-CM

## 2023-03-02 DIAGNOSIS — M79675 Pain in left toe(s): Secondary | ICD-10-CM | POA: Diagnosis not present

## 2023-03-02 NOTE — Progress Notes (Signed)
  Subjective:  Patient ID: Nancy Schroeder, female    DOB: Apr 15, 1933,  MRN: [id number]  Nancy Schroeder presents to clinic today for at risk foot care. Patient has h/o PAD and painful, elongated thickened toenails x 10 which are symptomatic when wearing enclosed shoe gear. This interferes with his/her daily activities.  Her son, [name], is present during today's visit. Chief Complaint  Patient presents with   RFC    She is here for a nail trim, PCP was is Polanco and seen 1 month ago.    New problem(s): None.   PCP is Del Newman Nip, Tenna Child, FNP.  Allergies  Allergen Reactions   Amoxicillin Hives   Augmentin [Amoxicillin-Pot Clavulanate] Hives    Review of Systems: Negative except as noted in the HPI.  Objective: No changes noted in today's physical examination. There were no vitals filed for this visit. Nancy Schroeder is a pleasant 89 y.o. female WD, WN in NAD. AAO x 3.  Vascular Examination: CFT <3 seconds b/l. DP/PT pulses faintly palpable b/l. Skin temperature gradient warm to warm b/l. No pain with calf compression. No ischemia or gangrene. No cyanosis or clubbing noted b/l. No edema noted b/l LE.   Neurological Examination: Sensation grossly intact b/l with 10 gram monofilament. Vibratory sensation intact b/l.   Dermatological Examination: Pedal skin warm and supple b/l.   No open wounds. No interdigital macerations.  Toenails 1-5 b/l thick, discolored, elongated with subungual debris and pain on dorsal palpation.    No hyperkeratotic nor porokeratotic lesions present on today's visit.  Musculoskeletal Examination: Muscle strength 5/5 to b/l LE. HAV with bunion bilaterally and hammertoes 2-5 b/l. Patient in transport chair.  Radiographs: None  Assessment/Plan: 1. Pain due to onychomycosis of toenails of both feet   2. Peripheral vascular disease (HCC)     Patient was evaluated and treated. All patient's and/or POA's questions/concerns addressed on  today's visit. Mycotic toenails 1-5 debrided in length and girth without incident. Continue soft, supportive shoe gear daily. Report any pedal injuries to medical professional. Call office if there are any quesitons/concerns. -Patient/POA to call should there be question/concern in the interim.   Return in about 3 months (around 05/31/2023).  [name], DPM      [facility] LOCATION: [address] [place] [address]                   Office [phone]   [facility] LOCATION: [address] [place], [place] [address] Office [phone]

## 2023-03-04 ENCOUNTER — Encounter: Payer: Self-pay | Admitting: Podiatry

## 2023-03-07 ENCOUNTER — Other Ambulatory Visit: Payer: Self-pay | Admitting: Neurology

## 2023-03-07 DIAGNOSIS — M545 Low back pain, unspecified: Secondary | ICD-10-CM

## 2023-03-29 ENCOUNTER — Other Ambulatory Visit: Payer: Self-pay | Admitting: Family Medicine

## 2023-04-15 ENCOUNTER — Ambulatory Visit: Payer: Self-pay | Admitting: Family Medicine

## 2023-06-06 ENCOUNTER — Ambulatory Visit: Payer: Self-pay

## 2023-06-06 NOTE — Telephone Encounter (Signed)
 Chief Complaint: fatigue Symptoms: fatigue, weakness Frequency: "years" Pertinent Negatives: Patient denies CP, SOB, N/V/D, palpitations Disposition: [] ED /[x] Urgent Care (no appt availability in office) / [] Appointment(In office/virtual)/ []  [facility]/ [] Home Care/ [x] Refused Recommended Disposition /[] [facility]/ []  Follow-up with PCP Additional Notes: Pt reports weakness and fatigue with lisinopril  "for years." Pt states this is not new but is getting worse. Pt did not take her lisinopril  today d/t side effects. Pt states she sleeps for 2-3 hours after taking lisinopril  and is in such a deep sleep she's "afraid I won't get up." Pt endorses weakness and exhaustion. Pt denies CP, SOB, palpitations, N/V/D. Pt states she is eating and drinking normally. Due to her weakness and severe exhaustion RN advised pt she should be seen today and advised UC. Pt declined, saying she's too tired. RN called CAL, CAL advised RN to schedule her either Tuesday with a different provider or Wednesday with her PCP. Pt declined to see a different provider so RN scheduled pt for Wednesday. RN advised pt's son (who was also on the phone) if the pt develops difficulty walking, N/V/D, CP, SOB, or worsening he needs to call 911. Son verbalized understanding.     Copied from CRM [id number]. Topic: Clinical - Red Word Triage >> Jun 06, 2023  1:07 PM DeAngela L wrote: Red Word that prompted transfer to Nurse Triage: high blood pressure pills making her really tired patient believes she is going to take the pill and not wake up Reason for Disposition  Taking a medicine that could cause weakness (e.g., blood pressure medications, diuretics)  Answer Assessment - Initial Assessment Questions 1. DESCRIPTION: "Describe how you are feeling."     Son states pt sleeps for 2-3 hours after taking lisinopril  2. SEVERITY: "How bad is it?"  "Can you stand and walk?"   - MILD (0-3): Feels weak or tired, but does not  interfere with work, school or normal activities.   - MODERATE (4-7): Able to stand and walk; weakness interferes with work, school, or normal activities.   - SEVERE (8-10): Unable to stand or walk; unable to do usual activities.     Pt able to walk but "I just get so exhausted", some interference with daily activities  3. ONSET: "When did these symptoms begin?" (e.g., hours, days, weeks, months)     "For years" 4. CAUSE: "What do you think is causing the weakness or fatigue?" (e.g., not drinking enough fluids, medical problem, trouble sleeping)     Lisinopril   5. NEW MEDICINES:  "Have you started on any new medicines recently?" (e.g., opioid pain medicines, benzodiazepines, muscle relaxants, antidepressants, antihistamines, neuroleptics, beta blockers)     None new 6. OTHER SYMPTOMS: "Do you have any other symptoms?" (e.g., chest pain, fever, cough, SOB, vomiting, diarrhea, bleeding, other areas of pain)     Excessive sleepiness. Did not take lisinopril  today. Took it last yesterday. "I get so exhausted, you have to help me out of the bed." Endorses weakness and exhaustion. "It's all I can do to walk until it passes." Pt has been taking lisinopril  "for years." Fatigue with lisinopril  "for years." Denies Cp or SOB. Endorses eating and drinking normally. Denies N/V/D. Denies palpitations  Protocols used: Weakness (Generalized) and Fatigue-A-AH

## 2023-06-07 NOTE — Telephone Encounter (Signed)
 Patient scheduled.

## 2023-06-08 ENCOUNTER — Encounter: Payer: Self-pay | Admitting: Family Medicine

## 2023-06-08 ENCOUNTER — Ambulatory Visit: Payer: Self-pay | Admitting: Family Medicine

## 2023-06-08 VITALS — BP 138/62 | HR 85 | Ht 61.0 in | Wt 173.1 lb

## 2023-06-08 DIAGNOSIS — R7303 Prediabetes: Secondary | ICD-10-CM | POA: Insufficient documentation

## 2023-06-08 DIAGNOSIS — G609 Hereditary and idiopathic neuropathy, unspecified: Secondary | ICD-10-CM | POA: Diagnosis not present

## 2023-06-08 DIAGNOSIS — I10 Essential (primary) hypertension: Secondary | ICD-10-CM | POA: Diagnosis not present

## 2023-06-08 DIAGNOSIS — G8929 Other chronic pain: Secondary | ICD-10-CM | POA: Diagnosis not present

## 2023-06-08 DIAGNOSIS — E559 Vitamin D deficiency, unspecified: Secondary | ICD-10-CM

## 2023-06-08 DIAGNOSIS — R0602 Shortness of breath: Secondary | ICD-10-CM | POA: Diagnosis not present

## 2023-06-08 DIAGNOSIS — M79604 Pain in right leg: Secondary | ICD-10-CM

## 2023-06-08 DIAGNOSIS — M545 Low back pain, unspecified: Secondary | ICD-10-CM | POA: Diagnosis not present

## 2023-06-08 DIAGNOSIS — M79605 Pain in left leg: Secondary | ICD-10-CM

## 2023-06-08 MED ORDER — LOSARTAN POTASSIUM 25 MG PO TABS
25.0000 mg | ORAL_TABLET | Freq: Every day | ORAL | 3 refills | Status: DC
Start: 2023-06-08 — End: 2023-08-09

## 2023-06-08 MED ORDER — DULOXETINE HCL 20 MG PO CPEP: 20.0000 mg | ORAL_CAPSULE | Freq: Every day | ORAL | 6 refills | Status: AC

## 2023-06-08 NOTE — Assessment & Plan Note (Addendum)
 D/C lisinopril  40 mg due to side effects, start losartan 25 mg once daily, continue amlodipine  5 mg once daily Labs ordered. Discussed with  patient to monitor their blood pressure regularly and maintain a heart-healthy diet rich in fruits, vegetables, whole grains, and low-fat dairy, while reducing sodium intake to less than 2,300 mg per day. Regular physical activity, such as 30 minutes of moderate exercise most days of the week, will help lower blood pressure and improve overall cardiovascular health. Avoiding smoking, limiting alcohol consumption, and managing stress. Take  prescribed medication, & take it as directed and avoid skipping doses. Seek emergency care if your blood pressure is (over 180/100) or you experience chest pain, shortness of breath, or sudden vision changes.Patient verbalizes understanding regarding plan of care and all questions answered.

## 2023-06-08 NOTE — Assessment & Plan Note (Signed)
 Last Hemoglobin A1c: 6.0 Labs: Ordered today, results pending; will follow up accordingly. Reviewed non-pharmacological interventions, including a balanced diet rich in lean proteins, healthy fats, whole grains, and high-fiber vegetables. Emphasized reducing refined sugars and processed carbohydrates, and incorporating more fruits, leafy greens, and legumes. Patient Understanding: The patient verbalized understanding of the care plan, and all questions were answered.

## 2023-06-08 NOTE — Patient Instructions (Signed)

## 2023-06-08 NOTE — Progress Notes (Signed)
 Established Patient Office Visit   Subjective  Patient ID: Nancy Schroeder, female    DOB: 10-22-1933  Age: 89 y.o. MRN: [id number]  Chief Complaint  Patient presents with   Fatigue    Fatigue with taking lisinopril . States it puts her right to sleep.  She is also having severe pain in legs today. Causing mobility issues.     She  has a past medical history of Anxiety, Depression, Essential hypertension, and H/O back injury (2016).  HPI Patient presents to the clinic for hypertension follow up. For the details of today's visit, please refer to assessment and plan.   Review of Systems  Constitutional:  Negative for chills and fever.  Respiratory:  Negative for shortness of breath.   Cardiovascular:  Negative for chest pain.  Gastrointestinal:  Negative for abdominal pain.  Genitourinary:  Negative for dysuria.  Neurological:  Negative for dizziness and headaches.      Objective:     BP 138/62   Pulse 85   Ht 5\' 1"  (1.549 m)   Wt 173 lb 1.9 oz (78.5 kg)   SpO2 95%   BMI 32.71 kg/m  BP Readings from Last 3 Encounters:  06/08/23 138/62  02/10/23 (!) 150/80  01/18/23 (!) 187/79      Physical Exam Vitals reviewed.  Constitutional:      General: She is not in acute distress.    Appearance: Normal appearance. She is not ill-appearing, toxic-appearing or diaphoretic.  HENT:     Head: Normocephalic.  Eyes:     General:        Right eye: No discharge.        Left eye: No discharge.     Conjunctiva/sclera: Conjunctivae normal.  Cardiovascular:     Rate and Rhythm: Normal rate.     Pulses: Normal pulses.     Heart sounds: Normal heart sounds.  Pulmonary:     Effort: Pulmonary effort is normal. No respiratory distress.     Breath sounds: Normal breath sounds.  Abdominal:     General: Bowel sounds are normal.     Palpations: Abdomen is soft.     Tenderness: There is no abdominal tenderness. There is no right CVA tenderness, left CVA tenderness or guarding.   Musculoskeletal:     Cervical back: Normal range of motion.  Skin:    General: Skin is warm and dry.     Capillary Refill: Capillary refill takes less than 2 seconds.  Neurological:     Mental Status: She is alert.  Psychiatric:        Mood and Affect: Mood normal.        Behavior: Behavior normal.      No results found for any visits on 06/08/23.  The ASCVD Risk score (Arnett DK, et al., 2019) failed to calculate for the following reasons:   The 2019 ASCVD risk score is only valid for ages 64 to 75    Assessment & Plan:  Primary hypertension -     CMP14+EGFR -     Lipid panel -     CBC with Differential/Platelet  Bilateral leg pain  Prediabetes Assessment & Plan: Last Hemoglobin A1c: 6.0 Labs: Ordered today, results pending; will follow up accordingly. Reviewed non-pharmacological interventions, including a balanced diet rich in lean proteins, healthy fats, whole grains, and high-fiber vegetables. Emphasized reducing refined sugars and processed carbohydrates, and incorporating more fruits, leafy greens, and legumes. Patient Understanding: The patient verbalized understanding of the care plan, and all questions  were answered.   Orders: -     Hemoglobin A1c  Vitamin D  deficiency -     VITAMIN D  25 Hydroxy (Vit-D Deficiency, Fractures)  Chronic bilateral low back pain, unspecified whether sciatica present -     DULoxetine  HCl; Take 1 capsule (20 mg total) by mouth daily.  Dispense: 30 capsule; Refill: 6  Idiopathic peripheral neuropathy -     DULoxetine  HCl; Take 1 capsule (20 mg total) by mouth daily.  Dispense: 30 capsule; Refill: 6  SOB (shortness of breath) -     Brain natriuretic peptide  Essential hypertension Assessment & Plan: D/C lisinopril  40 mg due to side effects, start losartan 25 mg once daily, continue amlodipine  5 mg once daily Labs ordered. Discussed with  patient to monitor their blood pressure regularly and maintain a heart-healthy diet rich  in fruits, vegetables, whole grains, and low-fat dairy, while reducing sodium intake to less than 2,300 mg per day. Regular physical activity, such as 30 minutes of moderate exercise most days of the week, will help lower blood pressure and improve overall cardiovascular health. Avoiding smoking, limiting alcohol consumption, and managing stress. Take  prescribed medication, & take it as directed and avoid skipping doses. Seek emergency care if your blood pressure is (over 180/100) or you experience chest pain, shortness of breath, or sudden vision changes.Patient verbalizes understanding regarding plan of care and all questions answered.    Other orders -     Losartan Potassium; Take 1 tablet (25 mg total) by mouth daily.  Dispense: 30 tablet; Refill: 3    Return in about 3 months (around 09/08/2023), or if symptoms worsen or fail to improve, for hypertension.   Avelino Lek Amber Bail, FNP

## 2023-06-09 LAB — CBC WITH DIFFERENTIAL/PLATELET
Basophils Absolute: 0.1 10*3/uL (ref 0.0–0.2)
Basos: 1 %
EOS (ABSOLUTE): 0.1 10*3/uL (ref 0.0–0.4)
Eos: 1 %
Hematocrit: 41.7 % (ref 34.0–46.6)
Hemoglobin: 13.7 g/dL (ref 11.1–15.9)
Immature Grans (Abs): 0 10*3/uL (ref 0.0–0.1)
Immature Granulocytes: 0 %
Lymphocytes Absolute: 1.8 10*3/uL (ref 0.7–3.1)
Lymphs: 20 %
MCH: 30 pg (ref 26.6–33.0)
MCHC: 32.9 g/dL (ref 31.5–35.7)
MCV: 91 fL (ref 79–97)
Monocytes Absolute: 0.8 10*3/uL (ref 0.1–0.9)
Monocytes: 8 %
Neutrophils Absolute: 6.6 10*3/uL (ref 1.4–7.0)
Neutrophils: 70 %
Platelets: 338 10*3/uL (ref 150–450)
RBC: 4.57 x10E6/uL (ref 3.77–5.28)
RDW: 12.2 % (ref 11.7–15.4)
WBC: 9.5 10*3/uL (ref 3.4–10.8)

## 2023-06-09 LAB — CMP14+EGFR
ALT: 25 IU/L (ref 0–32)
AST: 22 IU/L (ref 0–40)
Albumin: 4.1 g/dL (ref 3.7–4.7)
Alkaline Phosphatase: 80 IU/L (ref 44–121)
BUN/Creatinine Ratio: 13 (ref 12–28)
BUN: 9 mg/dL (ref 8–27)
Bilirubin Total: 0.6 mg/dL (ref 0.0–1.2)
CO2: 17 mmol/L — ABNORMAL LOW (ref 20–29)
Calcium: 9.7 mg/dL (ref 8.7–10.3)
Chloride: 108 mmol/L — ABNORMAL HIGH (ref 96–106)
Creatinine, Ser: 0.68 mg/dL (ref 0.57–1.00)
Globulin, Total: 2.5 g/dL (ref 1.5–4.5)
Glucose: 110 mg/dL — ABNORMAL HIGH (ref 70–99)
Potassium: 4.2 mmol/L (ref 3.5–5.2)
Sodium: 145 mmol/L — ABNORMAL HIGH (ref 134–144)
Total Protein: 6.6 g/dL (ref 6.0–8.5)
eGFR: 84 mL/min/1.73

## 2023-06-09 LAB — VITAMIN D 25 HYDROXY (VIT D DEFICIENCY, FRACTURES): Vit D, 25-Hydroxy: 14.4 ng/mL — ABNORMAL LOW (ref 30.0–100.0)

## 2023-06-09 LAB — HEMOGLOBIN A1C
Est. average glucose Bld gHb Est-mCnc: 123 mg/dL
Hgb A1c MFr Bld: 5.9 % — ABNORMAL HIGH (ref 4.8–5.6)

## 2023-06-09 LAB — LIPID PANEL
Chol/HDL Ratio: 3.6 ratio (ref 0.0–4.4)
Cholesterol, Total: 168 mg/dL (ref 100–199)
HDL: 47 mg/dL
LDL Chol Calc (NIH): 96 mg/dL (ref 0–99)
Triglycerides: 139 mg/dL (ref 0–149)
VLDL Cholesterol Cal: 25 mg/dL (ref 5–40)

## 2023-06-09 NOTE — Progress Notes (Signed)
 Please inform patient,  Hemoglobin A1c  5.9   indicates prediabetes - no medication intervention just lifestyle changes   Pre-Diabetes Diet  Eat More:  Whole grains: Oats, quinoa, brown rice. Vegetables: Leafy greens, broccoli, green beans. Fruits: Low-sugar like berries, apples. Lean proteins: Chicken, fish, beans, eggs. Healthy fats: Nuts, seeds, avocado, olive oil.  Limit:  Refined carbs: White bread, pastries. Sugary foods: Soda, candy, desserts. Fried and fatty foods.  Tips:  Eat balanced meals with portion control. Stay hydrated (water over sugary drinks). Pair with regular exercise (e.g., walking). Example: Grilled chicken, quinoa, and steamed broccoli.  Combine diet with regular physical activity (e.g., 30 minutes of walking daily or 5 times per week.)      Vitamin D  levels low, I advise to taking  over the counter supplements of vitamin D  1000 IU/day to prevent low vitamin D  levels.  Consume Vitamin D -Rich Foods: Fatty Fish: Salmon, mackerel, tuna, and sardines. Egg Yolks: A good source if eaten whole. Fortified Foods: Milk, orange juice, cereals, and plant-based milks (like almond or soy milk). Mushrooms: Especially those exposed to sunlight or UV light. Cod Liver Oil: A concentrated source of vitamin D . Including these foods in your diet can help boost vitamin D  levels   Symptoms of Low Vitamin D :  Fatigue and low energy. Bone pain and muscle weakness. Increased risk of fractures or weak bones. Frequent illness or infections. Mood changes, like depression or anxiety. Hair loss or thinning.

## 2023-07-05 ENCOUNTER — Ambulatory Visit (INDEPENDENT_AMBULATORY_CARE_PROVIDER_SITE_OTHER): Payer: Medicare HMO | Admitting: Podiatry

## 2023-07-05 DIAGNOSIS — Z91198 Patient's noncompliance with other medical treatment and regimen for other reason: Secondary | ICD-10-CM

## 2023-07-10 NOTE — Progress Notes (Signed)
 1. Failure to attend appointment with reason given    Appointment rescheduled by patient's son.

## 2023-07-15 ENCOUNTER — Other Ambulatory Visit: Payer: Self-pay | Admitting: Family Medicine

## 2023-07-15 DIAGNOSIS — I10 Essential (primary) hypertension: Secondary | ICD-10-CM

## 2023-08-08 ENCOUNTER — Other Ambulatory Visit: Payer: Self-pay | Admitting: Family Medicine

## 2023-09-08 ENCOUNTER — Ambulatory Visit: Admitting: Family Medicine

## 2023-09-08 ENCOUNTER — Encounter: Payer: Self-pay | Admitting: Family Medicine

## 2023-09-08 VITALS — BP 139/84 | HR 79 | Resp 16 | Ht 60.0 in | Wt 178.0 lb

## 2023-09-08 DIAGNOSIS — B9689 Other specified bacterial agents as the cause of diseases classified elsewhere: Secondary | ICD-10-CM

## 2023-09-08 DIAGNOSIS — L602 Onychogryphosis: Secondary | ICD-10-CM | POA: Diagnosis not present

## 2023-09-08 DIAGNOSIS — E038 Other specified hypothyroidism: Secondary | ICD-10-CM

## 2023-09-08 DIAGNOSIS — E559 Vitamin D deficiency, unspecified: Secondary | ICD-10-CM | POA: Diagnosis not present

## 2023-09-08 DIAGNOSIS — H109 Unspecified conjunctivitis: Secondary | ICD-10-CM

## 2023-09-08 DIAGNOSIS — I10 Essential (primary) hypertension: Secondary | ICD-10-CM | POA: Diagnosis not present

## 2023-09-08 DIAGNOSIS — E039 Hypothyroidism, unspecified: Secondary | ICD-10-CM | POA: Diagnosis not present

## 2023-09-08 DIAGNOSIS — E538 Deficiency of other specified B group vitamins: Secondary | ICD-10-CM | POA: Diagnosis not present

## 2023-09-08 MED ORDER — POLYMYXIN B-TRIMETHOPRIM 10000-0.1 UNIT/ML-% OP SOLN: 1.0000 [drp] | Freq: Four times a day (QID) | OPHTHALMIC | 0 refills | Status: AC

## 2023-09-08 MED ORDER — LOSARTAN POTASSIUM 25 MG PO TABS: 25.0000 mg | ORAL_TABLET | Freq: Every day | ORAL | 1 refills | Status: AC

## 2023-09-08 MED ORDER — AMLODIPINE BESYLATE 5 MG PO TABS: 5.0000 mg | ORAL_TABLET | Freq: Every day | ORAL | 1 refills | Status: DC

## 2023-09-08 MED ORDER — HYDROCHLOROTHIAZIDE 25 MG PO TABS: 25.0000 mg | ORAL_TABLET | Freq: Every day | ORAL | 1 refills | Status: AC

## 2023-09-08 NOTE — Patient Instructions (Signed)

## 2023-09-08 NOTE — Assessment & Plan Note (Signed)
 Continue Losartan  25 mg once daily, amlodipine  5 mg once daily  Labs ordered. Discussed with  patient to monitor their blood pressure regularly and maintain a heart-healthy diet rich in fruits, vegetables, whole grains, and low-fat dairy, while reducing sodium intake to less than 2,300 mg per day. Regular physical activity, such as 30 minutes of moderate exercise most days of the week, will help lower blood pressure and improve overall cardiovascular health. Avoiding smoking, limiting alcohol consumption, and managing stress. Take  prescribed medication, & take it as directed and avoid skipping doses. Seek emergency care if your blood pressure is (over 180/100) or you experience chest pain, shortness of breath, or sudden vision changes.Patient verbalizes understanding regarding plan of care and all questions answered.

## 2023-09-08 NOTE — Assessment & Plan Note (Signed)
 Trimethoprim -polymyxin B  eye drops x 5 days Discussed handwashing to limit spread to others, decrease rubbing of eye, careful flushing of eyes with saline.

## 2023-09-08 NOTE — Progress Notes (Signed)
 Established Patient Office Visit   Subjective  Patient ID: Nancy Schroeder, female    DOB: 09-Mar-1933  Age: 89 y.o. MRN: [id number]  Chief Complaint  Patient presents with   Eye Drainage    States she gets hard crust in her eyes and they water   Fatigue    States she feels so tired all the time and its been going on for a while now and all she wants to do is stay in bed     She  has a past medical history of Anxiety, Depression, Essential hypertension, and H/O back injury (2016).  The patient presents with a new eye condition affecting both eyes, which began over a month ago and has been gradually worsening. She reports brown crusting and persistent itching, but denies any eye pain. There is no known exposure to conjunctivitis (pink eye), and she does not wear contact lenses. Associated symptoms include eye discharge and itching. She denies fever. The etiology is unclear, and the use of over-the-counter eye wash has provided no relief.    Review of Systems  Constitutional:  Negative for fever.  Eyes:  Positive for discharge.  Respiratory:  Negative for shortness of breath.   Cardiovascular:  Negative for chest pain.  Genitourinary:  Negative for dysuria.  Neurological:  Negative for dizziness and headaches.      Objective:     BP 139/84   Pulse 79   Resp 16   Ht 5' (1.524 m)   Wt 178 lb (80.7 kg)   SpO2 94%   BMI 34.76 kg/m  BP Readings from Last 3 Encounters:  09/08/23 139/84  06/08/23 138/62  02/10/23 (!) 150/80      Physical Exam Vitals reviewed.  Constitutional:      General: She is not in acute distress.    Appearance: Normal appearance. She is not ill-appearing, toxic-appearing or diaphoretic.  HENT:     Head: Normocephalic.  Eyes:     General:        Right eye: Discharge present.        Left eye: Discharge present.    Conjunctiva/sclera: Conjunctivae normal.  Cardiovascular:     Rate and Rhythm: Normal rate.     Pulses: Normal pulses.      Heart sounds: Normal heart sounds.  Pulmonary:     Effort: Pulmonary effort is normal. No respiratory distress.     Breath sounds: Normal breath sounds.  Skin:    General: Skin is warm and dry.     Capillary Refill: Capillary refill takes less than 2 seconds.  Neurological:     Mental Status: She is alert.  Psychiatric:        Mood and Affect: Mood normal.        Behavior: Behavior normal.      No results found for any visits on 09/08/23.  The ASCVD Risk score (Arnett DK, et al., 2019) failed to calculate for the following reasons:   The 2019 ASCVD risk score is only valid for ages 88 to 88    Assessment & Plan:  TSH (thyroid -stimulating hormone deficiency) -     TSH + free T4  Essential hypertension Assessment & Plan: Continue Losartan  25 mg once daily, amlodipine  5 mg once daily  Labs ordered. Discussed with  patient to monitor their blood pressure regularly and maintain a heart-healthy diet rich in fruits, vegetables, whole grains, and low-fat dairy, while reducing sodium intake to less than 2,300 mg per day. Regular physical activity, such  as 30 minutes of moderate exercise most days of the week, will help lower blood pressure and improve overall cardiovascular health. Avoiding smoking, limiting alcohol consumption, and managing stress. Take  prescribed medication, & take it as directed and avoid skipping doses. Seek emergency care if your blood pressure is (over 180/100) or you experience chest pain, shortness of breath, or sudden vision changes.Patient verbalizes understanding regarding plan of care and all questions answered.   Orders: -     amLODIPine  Besylate; Take 1 tablet (5 mg total) by mouth daily. Hypertension  Dispense: 90 tablet; Refill: 1 -     hydroCHLOROthiazide ; Take 1 tablet (25 mg total) by mouth daily. Hypertension  Dispense: 90 tablet; Refill: 1 -     BMP8+eGFR -     Lipid panel -     CBC with Differential/Platelet  Vitamin D  deficiency -     VITAMIN D   25 Hydroxy (Vit-D Deficiency, Fractures)  Vitamin B12 deficiency -     B12 and Folate Panel  Onychogryphosis -     Ambulatory referral to Podiatry  Bacterial conjunctivitis of both eyes Assessment & Plan: Trimethoprim -polymyxin B  eye drops x 5 days Discussed handwashing to limit spread to others, decrease rubbing of eye, careful flushing of eyes with saline.    Hypothyroidism, unspecified type Assessment & Plan: Plan is to continue Synthroid  therapy and recheck TSH levels to monitor thyroid  function   Other orders -     Losartan  Potassium; Take 1 tablet (25 mg total) by mouth daily.  Dispense: 90 tablet; Refill: 1 -     Polymyxin B -Trimethoprim ; Place 1 drop into both eyes every 6 (six) hours for 5 days.  Dispense: 10 mL; Refill: 0    Return in about 4 months (around 01/08/2024), or if symptoms worsen or fail to improve, for chronic follow-up with [facility] .   Hilario Kidd Wilhelmena Falter, FNP

## 2023-09-08 NOTE — Assessment & Plan Note (Signed)
 Plan is to continue Synthroid  therapy and recheck TSH levels to monitor thyroid  function

## 2023-09-09 LAB — CBC WITH DIFFERENTIAL/PLATELET
Basophils Absolute: 0.1 x10E3/uL (ref 0.0–0.2)
Basos: 1 %
EOS (ABSOLUTE): 0.1 x10E3/uL (ref 0.0–0.4)
Eos: 1 %
Hematocrit: 41.5 % (ref 34.0–46.6)
Hemoglobin: 13.4 g/dL (ref 11.1–15.9)
Immature Grans (Abs): 0 x10E3/uL (ref 0.0–0.1)
Immature Granulocytes: 0 %
Lymphocytes Absolute: 2 x10E3/uL (ref 0.7–3.1)
Lymphs: 21 %
MCH: 30.5 pg (ref 26.6–33.0)
MCHC: 32.3 g/dL (ref 31.5–35.7)
MCV: 95 fL (ref 79–97)
Monocytes Absolute: 0.8 x10E3/uL (ref 0.1–0.9)
Monocytes: 8 %
Neutrophils Absolute: 6.2 x10E3/uL (ref 1.4–7.0)
Neutrophils: 69 %
Platelets: 294 x10E3/uL (ref 150–450)
RBC: 4.39 x10E6/uL (ref 3.77–5.28)
RDW: 12.6 % (ref 11.7–15.4)
WBC: 9.1 x10E3/uL (ref 3.4–10.8)

## 2023-09-09 LAB — BMP8+EGFR
BUN/Creatinine Ratio: 14 (ref 12–28)
BUN: 11 mg/dL (ref 8–27)
CO2: 19 mmol/L — ABNORMAL LOW (ref 20–29)
Calcium: 9.5 mg/dL (ref 8.7–10.3)
Chloride: 106 mmol/L (ref 96–106)
Creatinine, Ser: 0.76 mg/dL (ref 0.57–1.00)
Glucose: 101 mg/dL — ABNORMAL HIGH (ref 70–99)
Potassium: 4.4 mmol/L (ref 3.5–5.2)
Sodium: 141 mmol/L (ref 134–144)
eGFR: 76 mL/min/1.73 (ref >59)

## 2023-09-09 LAB — LIPID PANEL
Chol/HDL Ratio: 3.9 ratio (ref 0.0–4.4)
Cholesterol, Total: 175 mg/dL (ref 100–199)
HDL: 45 mg/dL (ref >39)
LDL Chol Calc (NIH): 105 mg/dL — ABNORMAL HIGH (ref 0–99)
Triglycerides: 144 mg/dL (ref 0–149)
VLDL Cholesterol Cal: 25 mg/dL (ref 5–40)

## 2023-09-09 LAB — B12 AND FOLATE PANEL
Folate: 18.5 ng/mL (ref >3.0)
Vitamin B-12: 290 pg/mL (ref 232–1245)

## 2023-09-09 LAB — VITAMIN D 25 HYDROXY (VIT D DEFICIENCY, FRACTURES): Vit D, 25-Hydroxy: 21.6 ng/mL — ABNORMAL LOW (ref 30.0–100.0)

## 2023-09-09 LAB — TSH+FREE T4
Free T4: 1.26 ng/dL (ref 0.82–1.77)
TSH: 4.27 u[IU]/mL (ref 0.450–4.500)

## 2023-09-14 ENCOUNTER — Ambulatory Visit: Payer: Self-pay | Admitting: Family Medicine

## 2023-09-14 ENCOUNTER — Other Ambulatory Visit: Payer: Self-pay | Admitting: Family Medicine

## 2023-09-14 NOTE — Progress Notes (Signed)
 Please inform patient,  Kidney function normal, thyroid  panel normal   Cholesterol levels elevated, start lifestyle modifications and follow diet low in saturated fat. Plan: Crestor  5 mg once daily  Limit: Saturated fats: Butter, cream, fatty meats. Trans fats: Fried foods, processed snacks. Sugar and refined carbs: Sweets, white bread. Focus on whole foods, healthy fats, and fiber to improve heart health!   Vitamin D  levels low, I advise to taking  over the counter supplements of vitamin D  1000 IU/day to prevent low vitamin D  levels.  Consume Vitamin D -Rich Foods: Fatty Fish: Salmon, mackerel, tuna, and sardines. Egg Yolks: A good source if eaten whole. Fortified Foods: Milk, orange juice, cereals, and plant-based milks (like almond or soy milk). Mushrooms: Especially those exposed to sunlight or UV light. Cod Liver Oil: A concentrated source of vitamin D . Including these foods in your diet can help boost vitamin D  levels   Symptoms of Low Vitamin D :  Fatigue and low energy. Bone pain and muscle weakness. Increased risk of fractures or weak bones. Frequent illness or infections. Mood changes, like depression or anxiety. Hair loss or thinning.

## 2023-09-15 MED ORDER — ROSUVASTATIN CALCIUM 5 MG PO TABS
5.0000 mg | ORAL_TABLET | Freq: Every day | ORAL | 3 refills | Status: AC
Start: 2023-09-15 — End: ?

## 2023-09-15 NOTE — Telephone Encounter (Signed)
-----   Message from Nancy Terry Gals Polanco sent at 09/14/2023 12:27 PM EDT ----- Please inform patient,  Kidney function normal, thyroid  panel normal   Cholesterol levels elevated, start lifestyle modifications and follow diet low in saturated fat. Plan: Crestor  5 mg once daily  Limit: Saturated fats: Butter, cream, fatty meats. Trans fats: Fried foods, processed snacks. Sugar and refined carbs: Sweets, white bread. Focus on whole foods, healthy fats, and fiber to improve heart health!   Vitamin D  levels low, I advise to taking  over the counter supplements of vitamin D  1000 IU/day to prevent low vitamin D  levels.  Consume Vitamin D -Rich Foods: Fatty Fish: Salmon, mackerel, tuna, and sardines. Egg Yolks: A good source if eaten whole. Fortified Foods: Milk, orange juice, cereals, and plant-based milks (like almond or soy milk). Mushrooms: Especially those exposed to sunlight or UV light. Cod Liver Oil: A concentrated source of vitamin D . Including these foods in your diet can help boost vitamin D  levels   Symptoms of Low Vitamin D :  Fatigue and low energy. Bone pain and muscle weakness. Increased risk of fractures or weak bones. Frequent illness or infections. Mood changes, like depression or anxiety. Hair loss or thinning.  ----- Message ----- From: Interface, [facility] Lab Results In Sent: 09/09/2023   5:36 AM EDT To: Nancy Terry Gals Falter, FNP

## 2023-09-15 NOTE — Telephone Encounter (Signed)
 Patient aware of results and recommendations.  Crestor  sent to [facility].

## 2023-10-12 ENCOUNTER — Encounter: Payer: Self-pay | Admitting: Podiatry

## 2023-10-12 ENCOUNTER — Ambulatory Visit: Admitting: Podiatry

## 2023-10-12 DIAGNOSIS — M79675 Pain in left toe(s): Secondary | ICD-10-CM | POA: Diagnosis not present

## 2023-10-12 DIAGNOSIS — I739 Peripheral vascular disease, unspecified: Secondary | ICD-10-CM

## 2023-10-12 DIAGNOSIS — B351 Tinea unguium: Secondary | ICD-10-CM | POA: Diagnosis not present

## 2023-10-12 DIAGNOSIS — M79674 Pain in right toe(s): Secondary | ICD-10-CM | POA: Diagnosis not present

## 2023-10-16 NOTE — Progress Notes (Signed)
  Subjective:  Patient ID: Nancy Schroeder, female    DOB: 21-Sep-1933,  MRN: [id number]  Marshall Roehrich presents to clinic today for at risk foot care. Patient has h/o PAD and painful thick toenails that are difficult to trim. Pain interferes with ambulation. Aggravating factors include wearing enclosed shoe gear. Pain is relieved with periodic professional debridement.  Chief Complaint  Patient presents with   [facility]    Diabetes:no Last A1c:n/a Last FSBS:n/a Anticoagulant:ASA 81 mg PCP Name & Last Visit: Hilario Terry Falter, FNP/09-08-23   New problem(s): None.   PCP is Del Wilhelmena Falter, Hilario, FNP.  Allergies  Allergen Reactions   Amoxicillin  Hives   Augmentin [Amoxicillin -Pot Clavulanate] Hives    Review of Systems: Negative except as noted in the HPI.  Objective: No changes noted in today's physical examination. There were no vitals filed for this visit. Nancy Schroeder is a pleasant 89 y.o. female in NAD. AAO x 3.  Vascular Examination: CFT <3 seconds b/l. DP/PT pulses faintly palpable b/l. Skin temperature gradient warm to warm b/l. No pain with calf compression. No ischemia or gangrene. No cyanosis or clubbing noted b/l. No edema noted b/l LE.   Neurological Examination: Sensation grossly intact b/l with 10 gram monofilament. Vibratory sensation intact b/l.   Dermatological Examination: Pedal skin warm and supple b/l.   No open wounds. No interdigital macerations.  Toenails 1-5 b/l thick, discolored, elongated with subungual debris and pain on dorsal palpation.    No hyperkeratotic nor porokeratotic lesions present on today's visit.  Musculoskeletal Examination: Muscle strength 5/5 to b/l LE. HAV with bunion bilaterally and hammertoes 2-5 b/l. Patient in transport chair.  Radiographs: None  Assessment/Plan: 1. Pain due to onychomycosis of toenails of both feet   2. Peripheral vascular disease [facility])   Consent given for treatment. Patient examined. All  patient's and/or POA's questions/concerns addressed on today's visit. Toenails 1-5 debrided in length and girth without incident. Continue foot and shoe inspections daily. Monitor blood glucose per PCP/Endocrinologist's recommendations. Continue soft, supportive shoe gear daily. Report any pedal injuries to medical professional. Call office if there are any questions/concerns. -Patient/POA to call should there be question/concern in the interim.   Return in about 3 months (around 01/11/2024).  Delon LITTIE Merlin, DPM      [facility] LOCATION: [address] [place] [address]                   Office [phone]   [facility] LOCATION: [address] [place], [place] [address] Office [phone]

## 2023-10-24 ENCOUNTER — Ambulatory Visit (INDEPENDENT_AMBULATORY_CARE_PROVIDER_SITE_OTHER): Payer: Medicare HMO

## 2023-10-24 VITALS — Ht 60.0 in | Wt 170.0 lb

## 2023-10-24 DIAGNOSIS — Z0000 Encounter for general adult medical examination without abnormal findings: Secondary | ICD-10-CM

## 2023-10-24 NOTE — Patient Instructions (Signed)
 Ms. Bark,  Thank you for taking the time for your Medicare Wellness Visit. I appreciate your continued commitment to your health goals. Please review the care plan we discussed, and feel free to reach out if I can assist you further.  Medicare recommends these wellness visits once per year to help you and your care team stay ahead of potential health issues. These visits are designed to focus on prevention, allowing your provider to concentrate on managing your acute and chronic conditions during your regular appointments.  Please note that Annual Wellness Visits do not include a physical exam. Some assessments may be limited, especially if the visit was conducted virtually. If needed, we may recommend a separate in-person follow-up with your provider.  No Referrals were placed during today's visit.   Wishing you excellent health and many blessings in the year to come!  -Darious Rehman, CMA  Ongoing Care Seeing your primary care provider every 3 to 6 months helps us  monitor your health and provide consistent, personalized care.   Recommended Screenings:  Health Maintenance  Topic Date Due   DTaP/Tdap/Td vaccine (1 - Tdap) Never done   Zoster (Shingles) Vaccine (2 of 2) 12/21/2021   Flu Shot  09/02/2023   COVID-19 Vaccine (4 - 2025-26 season) 10/03/2023   DEXA scan (bone density measurement)  06/07/2024*   Medicare Annual Wellness Visit  10/23/2024   Pneumococcal Vaccine for age over 9  Completed   HPV Vaccine  Aged Out   Meningitis B Vaccine  Aged Out  *Topic was postponed. The date shown is not the original due date.       10/24/2023    4:16 PM  Advanced Directives  Does Patient Have a Medical Advance Directive? Yes  Type of Estate agent of [place];Living will  Does patient want to make changes to medical advance directive? No - Patient declined  Copy of Healthcare Power of Attorney in Chart? Yes - validated most recent copy scanned in chart (See row  information)   Advance Care Planning is important because it: Ensures you receive medical care that aligns with your values, goals, and preferences. Provides guidance to your family and loved ones, reducing the emotional burden of decision-making during critical moments.  Vision: Annual vision screenings are recommended for early detection of glaucoma, cataracts, and diabetic retinopathy. These exams can also reveal signs of chronic conditions such as diabetes and high blood pressure.  Dental: Annual dental screenings help detect early signs of oral cancer, gum disease, and other conditions linked to overall health, including heart disease and diabetes.  Please see the attached documents for additional preventive care recommendations.

## 2023-10-24 NOTE — Progress Notes (Signed)
 Please attest and cosign this visit due to patients primary care provider not being immediately available at the time the visit was completed.   Subjective:   Nancy Schroeder is a 89 y.o. who presents for a Medicare Wellness preventive visit. As a reminder, Annual Wellness Visits don't include a physical exam, and some assessments may be limited, especially if this visit is performed virtually. We may recommend an in-person follow-up visit with your provider if needed.  Visit Complete: Virtual I connected with  Alvaro Lance on 10/24/23 by a audio enabled telemedicine application and verified that I am speaking with the correct person using two identifiers.  Patient Location: Home  Provider Location: Home Office  I discussed the limitations of evaluation and management by telemedicine. The patient expressed understanding and agreed to proceed.  Vital Signs: Because this visit was a virtual/telehealth visit, some criteria may be missing or patient reported. Any vitals not documented were not able to be obtained and vitals that have been documented are patient reported. VideoDeclined- This patient declined Librarian, academic. Therefore the visit was completed with audio only.  Persons Participating in Visit: Patient.  AWV Questionnaire: No: Patient Medicare AWV questionnaire was not completed prior to this visit. Cardiac Risk Factors include: advanced age (>73men, >54 women);dyslipidemia;hypertension;obesity (BMI >30kg/m2);sedentary lifestyle     Objective:    Today's Vitals   10/24/23 1616  Weight: 170 lb (77.1 kg)  Height: 5' (1.524 m)  PainSc: 7    Body mass index is 33.2 kg/m.    10/24/2023    4:16 PM 10/18/2022    8:52 AM 06/12/2022   11:25 AM 09/14/2021    8:46 AM 08/29/2020    9:37 AM 04/16/2020   12:34 PM 08/14/2019    4:04 PM  Advanced Directives  Does Patient Have a Medical Advance Directive? Yes No No No No No No  Type of Sports coach of [place];Living will        Does patient want to make changes to medical advance directive? No - Patient declined        Copy of Healthcare Power of Attorney in Chart? Yes - validated most recent copy scanned in chart (See row information)        Would patient like information on creating a medical advance directive?  Yes (ED - Information included in AVS) No - Patient declined Yes (ED - Information included in AVS) No - Patient declined      Current Medications (verified) Outpatient Encounter Medications as of 10/24/2023  Medication Sig   amLODipine  (NORVASC ) 5 MG tablet Take 1 tablet (5 mg total) by mouth daily. Hypertension   aspirin  EC 81 MG tablet Take 81 mg by mouth daily. Swallow whole.   busPIRone  (BUSPAR ) 30 MG tablet Take 1 tablet (30 mg total) by mouth daily.   Cholecalciferol (VITAMIN D3) 50 MCG (2000 UT) capsule Take 1 capsule (2,000 Units total) by mouth daily.   cyanocobalamin  (VITAMIN B12) 1000 MCG tablet Take 1 tablet (1,000 mcg total) by mouth daily.   DULoxetine  (CYMBALTA ) 20 MG capsule Take 1 capsule (20 mg total) by mouth daily.   hydrochlorothiazide  (HYDRODIURIL ) 25 MG tablet Take 1 tablet (25 mg total) by mouth daily. Hypertension   levothyroxine  (SYNTHROID ) 25 MCG tablet TAKE 1 TABLET(25 MCG) BY MOUTH DAILY BEFORE BREAKFAST   lidocaine  (XYLOCAINE ) 5 % ointment APPLY 1 APPLICATION TOPICALLY AS NEEDED   Lidocaine  5 % CREA Apply 1 Application topically at bedtime as needed.  losartan  (COZAAR ) 25 MG tablet Take 1 tablet (25 mg total) by mouth daily.   oxybutynin  (DITROPAN -XL) 10 MG 24 hr tablet TAKE 1 TABLET(10 MG) BY MOUTH AT BEDTIME   rosuvastatin  (CRESTOR ) 5 MG tablet Take 1 tablet (5 mg total) by mouth daily.   No facility-administered encounter medications on file as of 10/24/2023.    Allergies (verified) Amoxicillin  and Augmentin [amoxicillin -pot clavulanate]   History: Past Medical History:  Diagnosis Date   Anxiety     Depression    Essential hypertension    H/O back injury 2016   Past Surgical History:  Procedure Laterality Date   ABDOMINAL HYSTERECTOMY     total   CATARACT EXTRACTION, BILATERAL  01/2020   CHOLECYSTECTOMY     NECK SURGERY     Family History  Problem Relation Age of Onset   Diabetes Mother    Diabetes Father    Diabetes Sister    Social History   Socioeconomic History   Marital status: Widowed    Spouse name: Not on file   Number of children: 3   Years of education: Not on file   Highest education level: 8th grade  Occupational History   Occupation: retired  Tobacco Use   Smoking status: Never   Smokeless tobacco: Never  Vaping Use   Vaping status: Never Used  Substance and Sexual Activity   Alcohol use: Yes    Comment: 1-2 drinks per 6 months   Drug use: Never   Sexual activity: Not Currently    Birth control/protection: Surgical, Post-menopausal  Other Topics Concern   Not on file  Social History Narrative   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 Read, tv, putt around, likes astrology      Diet: Eats what she wants. Enjoys veggies and fruits   Caffeine: 1-2 cups of coffee, iced tea   Water: 6-8 cups daily       Wear seatbelt   Does not wear sunscreen   Smoke and carbon monoxide detectors         Social Drivers of Health   Financial Resource Strain: Low Risk  (10/24/2023)   Overall Financial Resource Strain (CARDIA)    Difficulty of Paying Living Expenses: Not hard at all  Food Insecurity: No Food Insecurity (10/24/2023)   Hunger Vital Sign    Worried About Running Out of Food in the Last Year: Never true    Ran Out of Food in the Last Year: Never true  Transportation Needs: No Transportation Needs (10/24/2023)   PRAPARE - Scientist, research (physical sciences) (Medical): No    Lack of Transportation (Non-Medical): No  Physical Activity: Inactive (10/24/2023)   Exercise Vital Sign    Days of Exercise per Week: 0 days    Minutes of Exercise per Session: 0 min  Stress: No Stress Concern Present (10/24/2023)   [employer] of Occupational Health - Occupational Stress Questionnaire    Feeling of Stress: Not at all  Social Connections: Socially Isolated (10/24/2023)   Social Connection and Isolation Panel    Frequency of Communication with Friends and Family: More than three times a week    Frequency of Social Gatherings with Friends and Family: More than three times a week  Attends Religious Services: Never    Active Member of Clubs or Organizations: No    Attends Banker Meetings: Never    Marital Status: Widowed    Tobacco Counseling Counseling given: Yes   Clinical Intake: Pre-visit preparation completed: Yes Pain : 0-10 Pain Score: 7  Pain Type: Chronic pain Pain Location: Back Pain Orientation: Lower Pain Descriptors / Indicators: Constant Pain Onset: More than a month ago Pain Frequency: Constant   BMI - recorded: 33.2 Nutritional Status: BMI > 30  Obese Nutritional Risks: None Diabetes: No Lab Results  Component Value Date   HGBA1C 5.9 (H) 06/08/2023   HGBA1C 6.0 (H) 10/29/2022   HGBA1C 6.1 (H) 10/22/2021    How often do you need to have someone help you when you read instructions, pamphlets, or other written materials from your doctor or pharmacy?: 1 - Never Interpreter Needed?: No Information entered by :: Anav Lammert W CMA (AAMA)  Activities of Daily Living     10/24/2023    4:28 PM  In your present state of health, do you have any difficulty performing the following activities:  Hearing? 0  Vision? 0  Difficulty concentrating or making decisions? 0  Walking or climbing stairs? 1  Dressing or bathing? 0  Doing errands, shopping? 1  Preparing Food and eating ? N  Using the Toilet? N   In the past six months, have you accidently leaked urine? N  Do you have problems with loss of bowel control? N  Managing your Medications? Y  Comment son manages meds  Managing your Finances? Y  Comment son Neurosurgeon or managing your Housekeeping? Y   Patient Care Team: Del Wilhelmena Falter, Hilario, FNP as PCP - General (Family Medicine) Debera Jayson MATSU, MD as Consulting Physician (Cardiology)   I have updated your Care Teams any recent Medical Services you may have received from other providers in the past year.     Assessment:   This is a routine wellness examination for [place].  Hearing/Vision screen Hearing Screening - Comments:: Patient denies any hearing difficulties.   Vision Screening - Comments:: Wears rx glasses - up to date with routine eye exams with  [facility]  Goals Addressed               This Visit's Progress     Decrease pain and improve mobility (pt-stated)        I want to work on decreasing my pain and be able to walk        Depression Screen     10/24/2023    4:20 PM 09/08/2023    8:52 AM 06/08/2023    9:07 AM 11/19/2022    9:08 AM 10/18/2022    8:52 AM 10/18/2022    8:51 AM 08/10/2022    9:44 AM  PHQ 2/9 Scores  PHQ - 2 Score 3 5 2 3  0 0 1  PHQ- 9 Score 10 13 7 15   7      Fall Risk     10/24/2023    4:27 PM 09/08/2023    8:52 AM 06/08/2023    9:07 AM 11/19/2022    9:07 AM 10/18/2022    8:52 AM  Fall Risk   Falls in the past year? 1 0 0 0 0  Number falls in past yr: 1 0  0 0  Injury with Fall? 1 0 0 0 0  Risk for fall due to : History of fall(s);Impaired balance/gait;Impaired mobility No Fall Risks  Impaired balance/gait;Impaired mobility  No Fall Risks  Follow up Falls evaluation completed;Education provided;Falls prevention discussed Falls evaluation completed;Education provided;Falls prevention discussed Follow up appointment  Falls evaluation completed    MEDICARE RISK AT HOME:  Medicare Risk at  Home Any stairs in or around the home?: No If so, are there any without handrails?: No Home free of loose throw rugs in walkways, pet beds, electrical cords, etc?: Yes Adequate lighting in your home to reduce risk of falls?: Yes Life alert?: No Use of a cane, walker or w/c?: Yes Grab bars in the bathroom?: No Shower chair or bench in shower?: Yes Elevated toilet seat or a handicapped toilet?: Yes  TIMED UP AND GO: Was the test performed?  No  Cognitive Function: 6CIT completed    10/18/2022    8:52 AM 02/06/2019    1:11 PM  MMSE - Mini Mental State Exam  Not completed: Unable to complete   Orientation to time  5  Orientation to Place  5  Registration  3  Attention/ Calculation  5  Recall  3  Language- name 2 objects  2  Language- repeat  1  Language- follow 3 step command  3  Language- read & follow direction  1  Write a sentence  1  Copy design  1  Total score  30        10/24/2023    4:30 PM 10/18/2022    8:52 AM 08/29/2020    9:42 AM  6CIT Screen  What Year? 0 points 0 points 0 points  What month? 0 points 0 points 0 points  What time? 0 points 0 points 0 points  Count back from 20 0 points 0 points 0 points  Months in reverse 0 points 0 points 0 points  Repeat phrase 0 points 0 points 2 points  Total Score 0 points 0 points 2 points    Immunizations Immunization History  Administered Date(s) Administered   Fluad Quad(high Dose 65+) 11/14/2019, 10/01/2020, 10/22/2021   Influenza-Unspecified 11/17/2022   Moderna Sars-Covid-2 Vaccination 03/18/2019, 04/15/2019, 12/05/2019   PNEUMOCOCCAL CONJUGATE-20 10/22/2021   Zoster Recombinant(Shingrix) 10/26/2021    Screening Tests Health Maintenance  Topic Date Due   DTaP/Tdap/Td (1 - Tdap) Never done   Zoster Vaccines- Shingrix (2 of 2) 12/21/2021   Influenza Vaccine  09/02/2023   COVID-19 Vaccine (4 - 2025-26 season) 10/03/2023   DEXA SCAN  06/07/2024 (Originally 10/24/2000)   Medicare Annual Wellness (AWV)   10/23/2024   Pneumococcal Vaccine: 50+ Years  Completed   HPV VACCINES  Aged Out   Meningococcal B Vaccine  Aged Out    Health Maintenance Health Maintenance Due  Topic Date Due   DTaP/Tdap/Td (1 - Tdap) Never done   Zoster Vaccines- Shingrix (2 of 2) 12/21/2021   Influenza Vaccine  09/02/2023   COVID-19 Vaccine (4 - 2025-26 season) 10/03/2023   Health Maintenance Items Addressed: Patient is aware of recommended vaccines and where she can have those done at.   Additional Screening: Vision Screening: Recommended annual ophthalmology exams for early detection of glaucoma and other disorders of the eye. Would you like a referral to an eye doctor? No    Dental Screening: Recommended annual dental exams for proper oral hygiene  Community Resource Referral / Chronic Care Management: CRR required this visit?  No   CCM required this visit?  No  Plan:   I have personally reviewed and noted the following in the patient's chart:   Medical and social history Use  of alcohol, tobacco or illicit drugs  Current medications and supplements including opioid prescriptions. Patient is not currently taking opioid prescriptions. Functional ability and status Nutritional status Physical activity Advanced directives List of other physicians Hospitalizations, surgeries, and ER visits in previous 12 months Vitals Screenings to include cognitive, depression, and falls Referrals and appointments  In addition, I have reviewed and discussed with patient certain preventive protocols, quality metrics, and best practice recommendations. A written personalized care plan for preventive services as well as general preventive health recommendations were provided to patient.   Shamika Pedregon, CMA   10/24/2023   After Visit Summary: (Mail) Due to this being a telephonic visit, the after visit summary with patients personalized plan was offered to patient via mail   Notes: Nothing significant to report at  this time.

## 2023-11-21 ENCOUNTER — Other Ambulatory Visit: Payer: Self-pay | Admitting: Family Medicine

## 2023-11-21 ENCOUNTER — Ambulatory Visit: Payer: Self-pay

## 2023-11-21 NOTE — Telephone Encounter (Signed)
 Copied from CRM #[id number]. Topic: Clinical - Medication Question >> Nov 21, 2023 11:01 AM Tiffany B wrote: Reason for CRM: Caller states LISINOPRIL  40 mg is making his mother tired and would like PCP to prescribe alternate. Chart does not reflect medication and caller confirmed the bottled stated Auth as the prescribing doctor.  Caller states he has to run and errands, please call in 2 hours.

## 2023-11-21 NOTE — Telephone Encounter (Signed)
 2nd attempt made. RN called phone number listed for patient's son, Ryan. He answered phone and when RN asked to speak with Ryan, disconnected the call. Routing to call back.

## 2023-11-21 NOTE — Telephone Encounter (Signed)
 FYI Only or Action Required?: Action required by provider: request for appointment, medication refill request, and clinical question for provider.  Patient was last seen in primary care on 09/08/2023 by Terry Wilhelmena Lloyd Hilario, FNP.  Called Nurse Triage reporting Medication Problem.  Symptoms began today.  Interventions attempted: Nothing.  Symptoms are: unchanged.  Triage Disposition: See Physician Within 24 Hours  Patient/caregiver understands and will follow disposition?: No, wishes to speak with PCP  Reason for Disposition  [1] MODERATE weakness (e.g., interferes with work, school, normal activities) AND [2] persists > 3 days  Answer Assessment - Initial Assessment Questions 1. NAME of MEDICINE: What medicine(s) are you calling about?     Lisinopril  40 mg, last taken this AM. Did not check BP. Feels very tired, son had to help to bed. 2. QUESTION: What is your question? (e.g., double dose of medicine, side effect)     After taking medication, makes her tired and keeps wanting to take a nap afterwards 3. PRESCRIBER: Who prescribed the medicine? Reason: if prescribed by specialist, call should be referred to that group.     Del Orbe Polanco 4. SYMPTOMS: Do you have any symptoms? If Yes, ask: What symptoms are you having?  How bad are the symptoms (e.g., mild, moderate, severe)     Denies dizziness, feeling faint  Answer Assessment - Initial Assessment Questions No available appts today. Advised UC today and ED if symptoms worsen. Patient declines UC and requests work in visit and call back.  1. DESCRIPTION: Describe how you are feeling.     Still feels drained with no energy 2. SEVERITY: How bad is it?  Can you stand and walk?     Able to walk and stand without dizziness 3. ONSET: When did these symptoms begin? (e.g., hours, days, weeks, months)     Been taking Lisinopril  years, last 6 months feel exhausted 4. CAUSE: What do you think is causing the  weakness or fatigue? (e.g., not drinking enough fluids, medical problem, trouble sleeping)     No problems with eating,drinking, no trouble sleeping 5. NEW MEDICINES:  Have you started on any new medicines recently? (e.g., opioid pain medicines, benzodiazepines, muscle relaxants, antidepressants, antihistamines, neuroleptics, beta blockers)     No other new meds 6. OTHER SYMPTOMS: Do you have any other symptoms? (e.g., chest pain, fever, cough, SOB, vomiting, diarrhea, bleeding, other areas of pain)     denies Have to sleep the medication off, feels exhausted after taking medication.  Protocols used: Medication Question Call-A-AH, Weakness (Generalized) and Fatigue-A-AH

## 2023-11-21 NOTE — Telephone Encounter (Signed)
 Caller states patient is completely out and would like request expedited. Inform caller please allow 48 to 72 hour business day turn around time./

## 2023-11-22 NOTE — Telephone Encounter (Signed)
Scheduled 10/27

## 2023-11-28 ENCOUNTER — Ambulatory Visit (INDEPENDENT_AMBULATORY_CARE_PROVIDER_SITE_OTHER)

## 2023-11-28 VITALS — BP 110/60 | HR 88 | Ht 60.0 in | Wt 179.0 lb

## 2023-11-28 DIAGNOSIS — E559 Vitamin D deficiency, unspecified: Secondary | ICD-10-CM

## 2023-11-28 DIAGNOSIS — I10 Essential (primary) hypertension: Secondary | ICD-10-CM

## 2023-11-28 DIAGNOSIS — Z23 Encounter for immunization: Secondary | ICD-10-CM

## 2023-11-28 NOTE — Progress Notes (Signed)
 Established Patient Office Visit  Subjective   Patient ID: Nancy Schroeder, female    DOB: May 29, 1933  Age: 89 y.o. MRN: [id number]  Chief Complaint  Patient presents with   Medical Management of Chronic Issues    Pt here to discuss medications     HPI Discussed the use of AI scribe software for clinical note transcription with the patient, who gave verbal consent to proceed.  History of Present Illness   Nancy Schroeder is an 89 year old female who presents with fatigue and medication concerns. She is accompanied by her son, Ryan.  Fatigue and emotional lability - Unusual fatigue present - Desire to cry without clear reason  Antihypertensive medication concerns - Currently taking amlodipine , hydrochlorothiazide , and losartan  for hypertension - Uncertainty regarding continuation of lisinopril ; has a full bottle at home but not listed on current medication list - Attributes fatigue to blood pressure medications  Urinary incontinence and overactive bladder - Urinary incontinence; urine 'just comes right out' when needing to use the bathroom - History of hysterectomy, believed to contribute to bladder issues - Currently taking oxybutynin  for overactive bladder symptoms  Lower extremity paresthesia and swelling - Persistent swelling and numbness in thighs for approximately one year - Described as 'numb and with needles' - Onset after chiropractic visit  Supplement use and preventive care - Taking vitamin D  and vitamin B2 supplements - Recently received influenza vaccination  Barriers to care - Difficulty attending in-person visits - Inquires about possibility of home visits      Patient Active Problem List   Diagnosis Date Noted   Bacterial conjunctivitis of both eyes 09/08/2023   Prediabetes 06/08/2023   Urine incontinence 10/29/2022   Impacted cerumen of both ears 10/29/2022   Bilateral lower extremity pain 06/30/2022   Fracture of toe of right foot  06/30/2022   Acute lower UTI 06/13/2022   Hypertensive urgency 06/13/2022   Anxiety 06/13/2022   Low back pain 06/13/2022   Recurrent falls 06/12/2022   Chronic pain 06/09/2022   Idiopathic peripheral neuropathy 04/07/2022   Vitamin D  deficiency 02/25/2022   GAD (generalized anxiety disorder) 02/25/2022   Cervical radiculopathy 02/25/2022   Lumbar radiculopathy 05/07/2021   Contact dermatitis 05/07/2021   Atypical mole 05/07/2021   Encounter for general adult medical examination with abnormal findings 10/01/2020   Trigeminal neuralgia pain 10/01/2020   Pulmonary nodule 04/29/2020   Fatigue 04/29/2020   Exertional dyspnea 04/21/2020   Hypothyroidism 04/21/2020   Forgetfulness 02/06/2019   Acute midline thoracic back pain 02/06/2019   Obesity (BMI 30.0-34.9) 02/06/2019   Pain due to onychomycosis of toenails of both feet 09/01/2018   Essential hypertension 02/20/2007    ROS    Objective:     BP 110/60 (BP Location: Left Arm, Patient Position: Sitting, Cuff Size: Normal)   Pulse 88   Ht 5' (1.524 m)   Wt 179 lb 0.6 oz (81.2 kg)   SpO2 95%   BMI 34.97 kg/m  BP Readings from Last 3 Encounters:  11/28/23 110/60  09/08/23 139/84  06/08/23 138/62   Wt Readings from Last 3 Encounters:  11/28/23 179 lb 0.6 oz (81.2 kg)  10/24/23 170 lb (77.1 kg)  09/08/23 178 lb (80.7 kg)      Physical Exam Vitals and nursing note reviewed.  Constitutional:      Appearance: Normal appearance.  HENT:     Head: Normocephalic.  Eyes:     Extraocular Movements: Extraocular movements intact.     Pupils: Pupils are equal,  round, and reactive to light.  Cardiovascular:     Rate and Rhythm: Normal rate and regular rhythm.  Pulmonary:     Effort: Pulmonary effort is normal.     Breath sounds: Normal breath sounds.  Musculoskeletal:     Cervical back: Normal range of motion and neck supple.  Neurological:     Mental Status: She is alert and oriented to person, place, and time.   Psychiatric:        Mood and Affect: Mood normal.        Thought Content: Thought content normal.     No results found for any visits on 11/28/23.  Last CBC Lab Results  Component Value Date   WBC 9.1 09/08/2023   HGB 13.4 09/08/2023   HCT 41.5 09/08/2023   MCV 95 09/08/2023   MCH 30.5 09/08/2023   RDW 12.6 09/08/2023   PLT 294 09/08/2023   Last metabolic panel Lab Results  Component Value Date   GLUCOSE 101 (H) 09/08/2023   NA 141 09/08/2023   K 4.4 09/08/2023   CL 106 09/08/2023   CO2 19 (L) 09/08/2023   BUN 11 09/08/2023   CREATININE 0.76 09/08/2023   EGFR 76 09/08/2023   CALCIUM  9.5 09/08/2023   PROT 6.6 06/08/2023   ALBUMIN 4.1 06/08/2023   LABGLOB 2.5 06/08/2023   AGRATIO 1.7 10/22/2021   BILITOT 0.6 06/08/2023   ALKPHOS 80 06/08/2023   AST 22 06/08/2023   ALT 25 06/08/2023   ANIONGAP 4 (L) 06/14/2022   Last lipids Lab Results  Component Value Date   CHOL 175 09/08/2023   HDL 45 09/08/2023   LDLCALC 105 (H) 09/08/2023   TRIG 144 09/08/2023   CHOLHDL 3.9 09/08/2023   Last hemoglobin A1c Lab Results  Component Value Date   HGBA1C 5.9 (H) 06/08/2023   Last thyroid  functions Lab Results  Component Value Date   TSH 4.270 09/08/2023   T4TOTAL 8.5 12/29/2020   FREET4 1.26 09/08/2023   Last vitamin D  Lab Results  Component Value Date   VD25OH 21.6 (L) 09/08/2023   Last vitamin B12 and Folate Lab Results  Component Value Date   VITAMINB12 290 09/08/2023   FOLATE 18.5 09/08/2023      The ASCVD Risk score (Arnett DK, et al., 2019) failed to calculate for the following reasons:   The 2019 ASCVD risk score is only valid for ages 65 to 65    Assessment & Plan:   Problem List Items Addressed This Visit       Cardiovascular and Mediastinum   Essential hypertension - Primary   Well controlled in office today, but she reports feeling fatigue and weak. She reports that she is still taking lisinopril , although it is no longer on her  medication list.  Discussed possible medication duplication with lisinopril  noted. - Discontinue lisinopril  to avoid duplication with losartan . - Provided updated medication list with indications. - Consider virtual visits for future appointments.      Relevant Orders   Basic Metabolic Panel (BMET)     Other   Vitamin D  deficiency   - Check vitamin D  and kidney function levels.      Relevant Orders   Vitamin D  (25 hydroxy)   Other Visit Diagnoses       Encounter for immunization       Relevant Orders   Flu vaccine HIGH DOSE PF(Fluzone Trivalent) (Completed)       Return in about 6 months (around 05/28/2024) for chronic follow-up with PCP.  Leita Longs, FNP

## 2023-11-28 NOTE — Assessment & Plan Note (Signed)
 Well controlled in office today, but she reports feeling fatigue and weak. She reports that she is still taking lisinopril , although it is no longer on her medication list.  Discussed possible medication duplication with lisinopril  noted. - Discontinue lisinopril  to avoid duplication with losartan . - Provided updated medication list with indications. - Consider virtual visits for future appointments.

## 2023-11-28 NOTE — Assessment & Plan Note (Signed)
-   Check vitamin D  and kidney function levels.

## 2023-11-29 LAB — VITAMIN D 25 HYDROXY (VIT D DEFICIENCY, FRACTURES): Vit D, 25-Hydroxy: 16.3 ng/mL — ABNORMAL LOW (ref 30.0–100.0)

## 2023-11-29 LAB — BASIC METABOLIC PANEL WITH GFR
BUN/Creatinine Ratio: 15 (ref 12–28)
BUN: 10 mg/dL (ref 8–27)
CO2: 16 mmol/L — ABNORMAL LOW (ref 20–29)
Calcium: 9.5 mg/dL (ref 8.7–10.3)
Chloride: 110 mmol/L — ABNORMAL HIGH (ref 96–106)
Creatinine, Ser: 0.66 mg/dL (ref 0.57–1.00)
Glucose: 118 mg/dL — ABNORMAL HIGH (ref 70–99)
Potassium: 4.4 mmol/L (ref 3.5–5.2)
Sodium: 143 mmol/L (ref 134–144)
eGFR: 84 mL/min/1.73 (ref >59)

## 2023-12-06 ENCOUNTER — Ambulatory Visit: Admitting: Family Medicine

## 2023-12-06 ENCOUNTER — Ambulatory Visit: Payer: Self-pay

## 2023-12-06 ENCOUNTER — Other Ambulatory Visit: Payer: Self-pay

## 2023-12-06 ENCOUNTER — Encounter: Payer: Self-pay | Admitting: Family Medicine

## 2023-12-06 ENCOUNTER — Telehealth: Payer: Self-pay | Admitting: Family Medicine

## 2023-12-06 VITALS — BP 128/72 | HR 73 | Temp 98.7°F | Ht 60.0 in | Wt 180.4 lb

## 2023-12-06 DIAGNOSIS — R8290 Unspecified abnormal findings in urine: Secondary | ICD-10-CM

## 2023-12-06 DIAGNOSIS — M545 Low back pain, unspecified: Secondary | ICD-10-CM | POA: Diagnosis not present

## 2023-12-06 LAB — URINALYSIS, ROUTINE W REFLEX MICROSCOPIC
Bilirubin Urine: NEGATIVE
Glucose, UA: NEGATIVE
Hgb urine dipstick: NEGATIVE
Hyaline Cast: NONE SEEN /LPF
Nitrite: NEGATIVE
RBC / HPF: NONE SEEN /HPF (ref 0–2)
Specific Gravity, Urine: 1.023 (ref 1.001–1.035)
pH: 6 (ref 5.0–8.0)

## 2023-12-06 LAB — MICROSCOPIC MESSAGE

## 2023-12-06 MED ORDER — NITROFURANTOIN MONOHYD MACRO 100 MG PO CAPS: 100.0000 mg | ORAL_CAPSULE | Freq: Two times a day (BID) | ORAL | 0 refills | Status: DC

## 2023-12-06 MED ORDER — NITROFURANTOIN MONOHYD MACRO 100 MG PO CAPS: 100.0000 mg | ORAL_CAPSULE | Freq: Two times a day (BID) | ORAL | 0 refills | Status: AC

## 2023-12-06 NOTE — Telephone Encounter (Signed)
 Made correction to Rx.

## 2023-12-06 NOTE — Progress Notes (Unsigned)
 Patient Office Visit  Assessment & Plan:  Acute right-sided low back pain without sciatica -     Urinalysis, Routine w reflex microscopic -     Urine Culture; Future -     Microscopic Message -     Nitrofurantoin Monohyd Macro; Take 1 capsule (100 mg total) by mouth 2 (two) times daily for 7 days.  Dispense: 1 capsule; Refill: 0   Assessment and Plan               No follow-ups on file.   Subjective:    Patient ID: Nancy Schroeder, female    DOB: 1933/03/13  Age: 89 y.o. MRN: [id number]  Chief Complaint  Patient presents with   Back Pain    R sided back pain x 1 day. Pt states that when she was taking a shower, the heat helped her pain.     Back Pain   Discussed the use of AI scribe software for clinical note transcription with the patient, who gave verbal consent to proceed.  History of Present Illness             The ASCVD Risk score (Arnett DK, et al., 2019) failed to calculate for the following reasons:   The 2019 ASCVD risk score is only valid for ages 83 to 76  Past Medical History:  Diagnosis Date   Anxiety    Depression    Essential hypertension    H/O back injury 2016   Past Surgical History:  Procedure Laterality Date   ABDOMINAL HYSTERECTOMY     total   CATARACT EXTRACTION, BILATERAL  01/2020   CHOLECYSTECTOMY     NECK SURGERY     Social History   Tobacco Use   Smoking status: Never   Smokeless tobacco: Never  Vaping Use   Vaping status: Never Used  Substance Use Topics   Alcohol use: Yes    Comment: 1-2 drinks per 6 months   Drug use: Never   Family History  Problem Relation Age of Onset   Diabetes Mother    Diabetes Father    Diabetes Sister    Allergies  Allergen Reactions   Amoxicillin  Hives   Augmentin [Amoxicillin -Pot Clavulanate] Hives    Review of Systems  Musculoskeletal:  Positive for back pain.      Objective:    BP 128/72   Pulse 73   Temp 98.7 F (37.1 C)   Ht 5' (1.524 m)   Wt 180 lb 6 oz  (81.8 kg)   SpO2 98%   BMI 35.23 kg/m  BP Readings from Last 3 Encounters:  12/06/23 128/72  11/28/23 110/60  09/08/23 139/84   Wt Readings from Last 3 Encounters:  12/06/23 180 lb 6 oz (81.8 kg)  11/28/23 179 lb 0.6 oz (81.2 kg)  10/24/23 170 lb (77.1 kg)    Physical Exam Vitals and nursing note reviewed.  Constitutional:      Appearance: Normal appearance.  HENT:     Head: Normocephalic.     Right Ear: Tympanic membrane, ear canal and external ear normal.     Left Ear: Tympanic membrane, ear canal and external ear normal.  Eyes:     Extraocular Movements: Extraocular movements intact.     Pupils: Pupils are equal, round, and reactive to light.  Cardiovascular:     Rate and Rhythm: Normal rate and regular rhythm.     Heart sounds: Normal heart sounds.  Pulmonary:     Effort: Pulmonary effort is normal.  Breath sounds: Normal breath sounds.  Abdominal:     General: Bowel sounds are normal.     Tenderness: There is no abdominal tenderness.     Comments: Tender over right lower back   Musculoskeletal:     Right lower leg: No edema.     Left lower leg: No edema.  Neurological:     General: No focal deficit present.     Mental Status: She is alert and oriented to person, place, and time.  Psychiatric:        Mood and Affect: Mood normal.        Behavior: Behavior normal.        Thought Content: Thought content normal.        Judgment: Judgment normal.      Results for orders placed or performed in visit on 12/06/23  Urinalysis, Routine w reflex microscopic  Result Value Ref Range   Color, Urine DARK YELLOW YELLOW   APPearance CLOUDY (A) CLEAR   Specific Gravity, Urine 1.023 1.001 - 1.035   pH 6.0 5.0 - 8.0   Glucose, UA NEGATIVE NEGATIVE   Bilirubin Urine NEGATIVE NEGATIVE   Ketones, ur 1+ (A) NEGATIVE   Hgb urine dipstick NEGATIVE NEGATIVE   Protein, ur 1+ (A) NEGATIVE   Nitrite NEGATIVE NEGATIVE   Leukocytes,Ua 1+ (A) NEGATIVE   WBC, UA 10-20 (A) 0 -  5 /HPF   RBC / HPF NONE SEEN 0 - 2 /HPF   Squamous Epithelial / HPF 0-5 < OR = 5 /HPF   Bacteria, UA MANY (A) NONE SEEN /HPF   Hyaline Cast NONE SEEN NONE SEEN /LPF  Microscopic Message  Result Value Ref Range   Note      {Labs (Optional):23779}

## 2023-12-06 NOTE — Telephone Encounter (Signed)
 Copied from CRM #[id number]. Topic: Clinical - Prescription Issue >> Dec 06, 2023  3:55 PM Amy B wrote: Reason for CRM: Pharmacy states Rx for Macrobid shows   Dose: 100 mg Route: Oral Frequency: 2 times daily Dispense Quantity: 1 capsule (1 day supply) Refills: 0  Duration: 7 days Dispense As Written: No      Sig: Take 1 capsule (100 mg total) by mouth 2 (two) times daily for 7 days.  They state the prescription is only for one single capsule for a one day supply and needs to be corrected before dispensing.  Please advise.

## 2023-12-06 NOTE — Telephone Encounter (Signed)
Noted patient scheduled

## 2023-12-06 NOTE — Telephone Encounter (Signed)
 FYI Only or Action Required?: FYI only for provider: appointment scheduled on 12/06/23.  Patient was last seen in primary care on 11/28/2023 by Bevely Doffing, FNP.  Called Nurse Triage reporting Pain.  Symptoms began several days ago.  Interventions attempted: Rest, hydration, or home remedies.  Symptoms are: gradually worsening.  Triage Disposition: See HCP Within 4 Hours (Or PCP Triage)  Patient/caregiver understands and will follow disposition?:  Reason for Disposition  [1] SEVERE back pain (e.g., excruciating, unable to do any normal activities) AND [2] not improved 2 hours after pain medicine  Answer Assessment - Initial Assessment Questions Denies dysuria, fever, trauma or hx of kidney stones.  1. ONSET: When did the pain begin? (e.g., minutes, hours, days)     12/03/23  2. LOCATION: Where does it hurt? (upper, mid or lower back)     Lower right back  3. SEVERITY: How bad is the pain?  (e.g., Scale 1-10; mild, moderate, or severe)     10/10  4. PATTERN: Is the pain constant? (e.g., yes, no; constant, intermittent)      Constant  5. RADIATION: Does the pain shoot into your legs or somewhere else?     Denies  6. CAUSE:  What do you think is causing the back pain?      Unknown  Protocols used: Back Pain-A-AH   Copied from CRM [id number]. Topic: Clinical - Red Word Triage >> Dec 06, 2023  9:54 AM Avram MATSU wrote: Red Word that prompted transfer to Nurse Triage: pain on right side and back  Past Medical History:  Diagnosis Date   Anxiety    Depression    Essential hypertension    H/O back injury 2016

## 2023-12-07 LAB — URINE CULTURE
MICRO NUMBER:: 17188772
Result:: NO GROWTH
SPECIMEN QUALITY:: ADEQUATE

## 2023-12-09 ENCOUNTER — Ambulatory Visit: Payer: Self-pay | Admitting: Family Medicine

## 2024-01-11 ENCOUNTER — Ambulatory Visit

## 2024-01-16 ENCOUNTER — Other Ambulatory Visit: Payer: Self-pay | Admitting: Family Medicine

## 2024-01-16 DIAGNOSIS — I10 Essential (primary) hypertension: Secondary | ICD-10-CM

## 2024-01-16 NOTE — Telephone Encounter (Unsigned)
 Copied from CRM #[id number]. Topic: Clinical - Medication Refill >> Jan 16, 2024  4:17 PM Fonda T wrote: Medication: amLODipine  (NORVASC ) 5 MG tablet   Has the patient contacted their pharmacy? Yes, advised to contact office, as pharmacy needs new prescription.    This is the patient's preferred pharmacy:   [facility] [phone] - [place], [place] - [address] AT [facility] OF [address] [place] [address] Phone: [phone] Fax: [phone]    Is this the correct pharmacy for this prescription? Yes If no, delete pharmacy and type the correct one.   Has the prescription been filled recently? Yes  Is the patient out of the medication? No, has 3 pills left  Has the patient been seen for an appointment in the last year OR does the patient have an upcoming appointment? Yes  Can we respond through MyChart? No, prefers call back [phone]  Agent: Please be advised that Rx refills may take up to 3 business days. We ask that you follow-up with your pharmacy.

## 2024-01-17 MED ORDER — AMLODIPINE BESYLATE 5 MG PO TABS: 5.0000 mg | ORAL_TABLET | Freq: Every day | ORAL | 1 refills | Status: AC

## 2024-01-18 ENCOUNTER — Ambulatory Visit: Admitting: Podiatry

## 2024-02-09 ENCOUNTER — Ambulatory Visit: Payer: Self-pay

## 2024-02-09 NOTE — Telephone Encounter (Signed)
 FYI Only or Action Required?: Action required by provider: concerned about medication side effects.  Patient was last seen in primary care on 12/06/2023 by Aletha Bene, MD.  Called Nurse Triage reporting Fatigue.  Symptoms began several weeks ago.  Interventions attempted: Nothing.  Symptoms are: stable.  Triage Disposition: See PCP When Office is Open (Within 3 Days), See Physician Within 24 Hours  Patient/caregiver understands and will follow disposition?: No, wishes to speak with PCP  Copied from CRM #[id number]. Topic: Clinical - Red Word Triage >> Feb 09, 2024  9:27 AM Suzen RAMAN wrote: Red Word that prompted transfer to Nurse Triage: extreme fatigue; per caller possible due to amLODipine  (NORVASC ) 5 MG tablet. Requesting alternate medication Reason for Disposition  [1] MODERATE weakness (e.g., interferes with work, school, normal activities) AND [2] persists > 3 days  [1] Fatigue (i.e., tires easily, decreased energy) AND [2] persists > 1 week  Answer Assessment - Initial Assessment Questions Becomes very tired and sleeping more after taking amlodipine  in am. Feels better in evening before PM dose. Questions if there's a lower dose.    1. DESCRIPTION: Describe how you are feeling.     tired 2. SEVERITY: How bad is it?  Can you stand and walk?     Standing/ walking normally 3. ONSET: When did these symptoms begin? (e.g., hours, days, weeks, months)     Few weeks ago 4. CAUSE: What do you think is causing the weakness or fatigue? (e.g., not drinking enough fluids, medical problem, trouble sleeping)     med 5. NEW MEDICINES:  Have you started on any new medicines recently? (e.g., opioid pain medicines, benzodiazepines, muscle relaxants, antidepressants, antihistamines, neuroleptics, beta blockers)     amlodipine  6. OTHER SYMPTOMS: Do you have any other symptoms? (e.g., chest pain, fever, cough, SOB, vomiting, diarrhea, bleeding, other areas of pain)     Denies  cough/ cold, weakness  Protocols used: Weakness (Generalized) and Fatigue-A-AH

## 2024-02-16 ENCOUNTER — Ambulatory Visit

## 2024-02-21 ENCOUNTER — Ambulatory Visit

## 2024-02-23 ENCOUNTER — Telehealth: Payer: Self-pay

## 2024-02-23 NOTE — Telephone Encounter (Signed)
 Copied from CRM #[id number]. Topic: Appointments - Transfer of Care >> Feb 23, 2024 10:52 AM Joesph B wrote: Pt is requesting to transfer FROM: Nancy Schroeder Pt is requesting to transfer TO: laura huenink Reason for requested transfer: n/a It is the responsibility of the team the patient would like to transfer to (Dr. bevely) to reach out to the patient if for any reason this transfer is not acceptable.

## 2024-02-23 NOTE — Telephone Encounter (Signed)
Okay to transfer  

## 2024-02-24 NOTE — Telephone Encounter (Signed)
 noted

## 2024-03-03 ENCOUNTER — Emergency Department (HOSPITAL_COMMUNITY)
Admission: EM | Admit: 2024-03-03 | Discharge: 2024-03-03 | Disposition: A | Discharge status: Home / Self Care | Attending: Emergency Medicine | Admitting: Emergency Medicine

## 2024-03-03 ENCOUNTER — Other Ambulatory Visit: Payer: Self-pay

## 2024-03-03 ENCOUNTER — Encounter (HOSPITAL_COMMUNITY): Payer: Self-pay | Admitting: Emergency Medicine

## 2024-03-03 DIAGNOSIS — N3001 Acute cystitis with hematuria: Secondary | ICD-10-CM

## 2024-03-03 DIAGNOSIS — H60502 Unspecified acute noninfective otitis externa, left ear: Secondary | ICD-10-CM

## 2024-03-03 LAB — URINALYSIS, ROUTINE W REFLEX MICROSCOPIC
Bilirubin Urine: NEGATIVE
Glucose, UA: 50 mg/dL — AB
Ketones, ur: 5 mg/dL — AB
Nitrite: NEGATIVE
Protein, ur: 100 mg/dL — AB
Specific Gravity, Urine: 1.02 (ref 1.005–1.030)
WBC, UA: >50 WBC/hpf (ref 0–5)
pH: 5 (ref 5.0–8.0)

## 2024-03-03 MED ORDER — NITROFURANTOIN MONOHYD MACRO 100 MG PO CAPS
100.0000 mg | ORAL_CAPSULE | Freq: Two times a day (BID) | ORAL | Status: DC
  Filled 2024-03-03: qty 1

## 2024-03-03 MED ORDER — NEOMYCIN-POLYMYXIN-HC 3.5-10000-1 OT SUSP: 4.0000 [drp] | Freq: Three times a day (TID) | OTIC | 0 refills | Status: AC

## 2024-03-03 MED ORDER — NITROFURANTOIN MONOHYD MACRO 100 MG PO CAPS: 100.0000 mg | ORAL_CAPSULE | Freq: Two times a day (BID) | ORAL | 0 refills | Status: AC

## 2024-03-03 NOTE — ED Notes (Signed)
 Pt brief changed with only a drop of urine. Bladder scan showed 0ml. After scanning bladder pt stated I think I am peeing now  pt had a few drops of urine. Pt states she is only able to pee when she is laying down.

## 2024-03-03 NOTE — Discharge Instructions (Signed)
 Were evaluated in the ER for 2 complaints first you are having urinary symptoms, your urinalysis does show infection.  We are prescribing antibiotics for this.  Follow-up closely with your primary care doctor.  You are also having left ear pain with some intermittent drainage, at this time your exam is reassuring but we will treat you with drops for possible outer ear infection.  Follow-up with your PCP.  Come back to the ER for new or worsening symptoms.

## 2024-03-03 NOTE — ED Triage Notes (Signed)
 PT complains of urinary retention and incontinence x 3 days. Pt has been unable to urinate today and stated on the way here wet her brief. Pt denies pain.

## 2024-03-03 NOTE — ED Provider Notes (Signed)
 " [place] EMERGENCY DEPARTMENT AT [facility] Provider Note   CSN: [id number] Arrival date & time: 03/03/24  1609     Patient presents with: Urinary Retention   Audreyanna Butkiewicz is a 89 y.o. female.  History of hypertension, hypothyroidism, obesity, prediabetes.  Presents to the ER today for evaluation of urinary frequency, urgency and dysuria x 3 days.  States she does have a bit of burning when she urinates, denies any external itching, discharge.  She states she has been losing control of her bladder because she has to go so badly but only goes small amounts.   HPI     Prior to Admission medications  Medication Sig Start Date End Date Taking? Authorizing Provider  amLODipine  (NORVASC ) 5 MG tablet Take 1 tablet (5 mg total) by mouth daily. Hypertension 01/17/24   Del Wilhelmena Lloyd Sola, FNP  aspirin  EC 81 MG tablet Take 81 mg by mouth daily. Swallow whole.    [provider]  busPIRone  (BUSPAR ) 30 MG tablet Take 1 tablet (30 mg total) by mouth daily. 11/21/22   Del Orbe Polanco, Iliana, FNP  Cholecalciferol (VITAMIN D3) 50 MCG (2000 UT) capsule Take 1 capsule (2,000 Units total) by mouth daily. 11/21/22   Del Wilhelmena Lloyd Sola, FNP  cyanocobalamin  (VITAMIN B12) 1000 MCG tablet Take 1 tablet (1,000 mcg total) by mouth daily. 06/30/22   Camara, Amadou, MD  DULoxetine  (CYMBALTA ) 20 MG capsule Take 1 capsule (20 mg total) by mouth daily. 06/08/23   Del Orbe Polanco, Iliana, FNP  hydrochlorothiazide  (HYDRODIURIL ) 25 MG tablet Take 1 tablet (25 mg total) by mouth daily. Hypertension 09/08/23   Del Orbe Polanco, Iliana, FNP  lidocaine  (XYLOCAINE ) 5 % ointment APPLY 1 APPLICATION TOPICALLY AS NEEDED 03/07/23   Gayland Lauraine PARAS, NP  Lidocaine  5 % CREA Apply 1 Application topically at bedtime as needed. 07/15/22   Melvenia Manus BRAVO, MD  losartan  (COZAAR ) 25 MG tablet Take 1 tablet (25 mg total) by mouth daily. 09/08/23   Del Orbe Polanco, Sola, FNP  oxybutynin  (DITROPAN -XL) 10  MG 24 hr tablet TAKE 1 TABLET(10 MG) BY MOUTH AT BEDTIME FOR URINARY INCONTINENCE 11/21/23   Del Orbe Polanco, Iliana, FNP  rosuvastatin  (CRESTOR ) 5 MG tablet Take 1 tablet (5 mg total) by mouth daily. 09/15/23   Del Wilhelmena Lloyd Sola, FNP    Allergies: Amoxicillin  and Augmentin [amoxicillin -pot clavulanate]    Review of Systems  Updated Vital Signs BP (!) 174/57   Pulse 87   Temp 98 F (36.7 C)   Resp 18   Ht 5' (1.524 m)   Wt 80.7 kg   SpO2 96%   BMI 34.76 kg/m   Physical Exam Vitals and nursing note reviewed.  Constitutional:      General: She is not in acute distress.    Appearance: She is well-developed.  HENT:     Head: Normocephalic and atraumatic.     Right Ear: Tympanic membrane and external ear normal.     Left Ear: Tympanic membrane normal.     Ears:     Comments: Mild tenderness left EAC    Mouth/Throat:     Mouth: Mucous membranes are moist.  Eyes:     Extraocular Movements: Extraocular movements intact.     Conjunctiva/sclera: Conjunctivae normal.     Pupils: Pupils are equal, round, and reactive to light.  Cardiovascular:     Rate and Rhythm: Normal rate and regular rhythm.     Heart sounds: No murmur heard. Pulmonary:  Effort: Pulmonary effort is normal. No respiratory distress.     Breath sounds: Normal breath sounds.  Abdominal:     Palpations: Abdomen is soft.     Tenderness: There is no abdominal tenderness.     Comments: Mild suprapubic tenderness  Musculoskeletal:        General: No swelling.     Cervical back: Neck supple.  Skin:    General: Skin is warm and dry.     Capillary Refill: Capillary refill takes less than 2 seconds.  Neurological:     General: No focal deficit present.     Mental Status: She is alert and oriented to person, place, and time.  Psychiatric:        Mood and Affect: Mood normal.     (all labs ordered are listed, but only abnormal results are displayed) Labs Reviewed  URINALYSIS, ROUTINE W REFLEX  MICROSCOPIC    EKG: None  Radiology: No results found.   Procedures   Medications Ordered in the ED - No data to display                                  Medical Decision Making Differential diagnose includes but not limited to UTI, pyelonephritis, vaginitis, other  Course: Patient having 3 days of urinary frequency and urgency.  No fevers or flank pain, she does not meet any sepsis criteria.  Classic UTI symptoms.  UA does show UTI, culture from 2024 showed Enterobacter faecalis sensitive to Macrobid , will start patient on Macrobid , send urine for culture.  At this time do not feel she needs further workup.  Prior labs reviewed showed normal kidney function in the past 6 months.  Patient also having left ear pain with palpation and intermittent crusting drainage, exam did not show any canal swelling but she does have some tenderness on palpation, there is no drainage on exam.  Will treat with Cortisporin and advised on PCP follow-up.  Patient does not have signs or symptoms to suggest malignant otitis externa.  Amount and/or Complexity of Data Reviewed Labs: ordered.  Risk Prescription drug management.        Final diagnoses:  None    ED Discharge Orders     None          Suellen Sherran DELENA DEVONNA 03/03/24 1849  "

## 2024-03-05 LAB — URINE CULTURE: Culture: NO GROWTH

## 2024-03-15 ENCOUNTER — Ambulatory Visit

## 2024-03-15 ENCOUNTER — Ambulatory Visit: Payer: Self-pay

## 2024-04-25 ENCOUNTER — Ambulatory Visit: Payer: Self-pay | Admitting: Internal Medicine

## 2024-05-09 ENCOUNTER — Ambulatory Visit: Admitting: Podiatry

## 2024-10-24 ENCOUNTER — Ambulatory Visit
